# Patient Record
Sex: Female | Born: 1935
Health system: Southern US, Community
[De-identification: ages and names within clinical notes are randomized; demographics above are authoritative.]

## PROBLEM LIST (undated history)

## (undated) DIAGNOSIS — E538 Deficiency of other specified B group vitamins: Secondary | ICD-10-CM

## (undated) DIAGNOSIS — R413 Other amnesia: Secondary | ICD-10-CM

## (undated) DIAGNOSIS — I10 Essential (primary) hypertension: Secondary | ICD-10-CM

## (undated) DIAGNOSIS — E7800 Pure hypercholesterolemia, unspecified: Secondary | ICD-10-CM

## (undated) DIAGNOSIS — E119 Type 2 diabetes mellitus without complications: Secondary | ICD-10-CM

## (undated) DIAGNOSIS — I480 Paroxysmal atrial fibrillation: Secondary | ICD-10-CM

## (undated) HISTORY — DX: Other amnesia: R41.3

## (undated) HISTORY — PX: ABDOMINAL HYSTERECTOMY: SHX81

## (undated) HISTORY — DX: Pure hypercholesterolemia, unspecified: E78.00

## (undated) HISTORY — DX: Paroxysmal atrial fibrillation: I48.0

## (undated) HISTORY — DX: Essential (primary) hypertension: I10

## (undated) HISTORY — DX: Type 2 diabetes mellitus without complications: E11.9

## (undated) HISTORY — DX: Deficiency of other specified B group vitamins: E53.8

---

## 1997-04-16 ENCOUNTER — Other Ambulatory Visit: Admission: RE | Admit: 1997-04-16 | Discharge: 1997-04-16 | Payer: Self-pay | Admitting: Family Medicine

## 1997-11-03 ENCOUNTER — Emergency Department (HOSPITAL_COMMUNITY): Admission: EM | Admit: 1997-11-03 | Discharge: 1997-11-03 | Payer: Self-pay | Admitting: Emergency Medicine

## 1998-04-10 ENCOUNTER — Emergency Department (HOSPITAL_COMMUNITY): Admission: EM | Admit: 1998-04-10 | Discharge: 1998-04-10 | Payer: Self-pay | Admitting: Emergency Medicine

## 1998-04-30 ENCOUNTER — Encounter: Payer: Self-pay | Admitting: Family Medicine

## 1998-04-30 ENCOUNTER — Ambulatory Visit (HOSPITAL_COMMUNITY): Admission: RE | Admit: 1998-04-30 | Discharge: 1998-04-30 | Payer: Self-pay | Admitting: Family Medicine

## 1999-03-25 ENCOUNTER — Encounter: Payer: Self-pay | Admitting: Family Medicine

## 1999-03-25 ENCOUNTER — Ambulatory Visit (HOSPITAL_COMMUNITY): Admission: RE | Admit: 1999-03-25 | Discharge: 1999-03-25 | Payer: Self-pay | Admitting: Family Medicine

## 2000-12-21 ENCOUNTER — Other Ambulatory Visit: Admission: RE | Admit: 2000-12-21 | Discharge: 2000-12-21 | Payer: Self-pay | Admitting: Family Medicine

## 2003-03-20 ENCOUNTER — Encounter: Admission: RE | Admit: 2003-03-20 | Discharge: 2003-03-20 | Payer: Self-pay | Admitting: Family Medicine

## 2003-04-28 ENCOUNTER — Emergency Department (HOSPITAL_COMMUNITY): Admission: EM | Admit: 2003-04-28 | Discharge: 2003-04-29 | Payer: Self-pay | Admitting: Emergency Medicine

## 2003-05-01 ENCOUNTER — Emergency Department (HOSPITAL_COMMUNITY): Admission: EM | Admit: 2003-05-01 | Discharge: 2003-05-01 | Payer: Self-pay | Admitting: Family Medicine

## 2003-05-03 ENCOUNTER — Emergency Department (HOSPITAL_COMMUNITY): Admission: EM | Admit: 2003-05-03 | Discharge: 2003-05-03 | Payer: Self-pay | Admitting: Family Medicine

## 2003-06-21 ENCOUNTER — Emergency Department (HOSPITAL_COMMUNITY): Admission: EM | Admit: 2003-06-21 | Discharge: 2003-06-21 | Payer: Self-pay | Admitting: Family Medicine

## 2003-06-28 ENCOUNTER — Emergency Department (HOSPITAL_COMMUNITY): Admission: EM | Admit: 2003-06-28 | Discharge: 2003-06-28 | Payer: Self-pay | Admitting: Family Medicine

## 2003-09-26 ENCOUNTER — Encounter (INDEPENDENT_AMBULATORY_CARE_PROVIDER_SITE_OTHER): Payer: Self-pay | Admitting: Specialist

## 2003-09-26 ENCOUNTER — Ambulatory Visit (HOSPITAL_COMMUNITY): Admission: RE | Admit: 2003-09-26 | Discharge: 2003-09-26 | Payer: Self-pay | Admitting: Gastroenterology

## 2004-07-13 ENCOUNTER — Encounter: Admission: RE | Admit: 2004-07-13 | Discharge: 2004-07-13 | Payer: Self-pay | Admitting: Family Medicine

## 2006-04-02 ENCOUNTER — Emergency Department (HOSPITAL_COMMUNITY): Admission: EM | Admit: 2006-04-02 | Discharge: 2006-04-03 | Payer: Self-pay | Admitting: Emergency Medicine

## 2006-04-05 ENCOUNTER — Encounter: Admission: RE | Admit: 2006-04-05 | Discharge: 2006-04-05 | Payer: Self-pay | Admitting: Family Medicine

## 2006-10-17 ENCOUNTER — Ambulatory Visit: Payer: Self-pay | Admitting: Surgery

## 2006-10-17 ENCOUNTER — Ambulatory Visit (HOSPITAL_COMMUNITY): Admission: RE | Admit: 2006-10-17 | Discharge: 2006-10-17 | Payer: Self-pay | Admitting: Family Medicine

## 2007-04-27 ENCOUNTER — Encounter: Admission: RE | Admit: 2007-04-27 | Discharge: 2007-04-27 | Payer: Self-pay | Admitting: Family Medicine

## 2008-11-22 ENCOUNTER — Ambulatory Visit (HOSPITAL_COMMUNITY): Admission: RE | Admit: 2008-11-22 | Discharge: 2008-11-22 | Payer: Self-pay | Admitting: Family Medicine

## 2009-06-01 ENCOUNTER — Observation Stay (HOSPITAL_COMMUNITY): Admission: EM | Admit: 2009-06-01 | Discharge: 2009-06-03 | Payer: Self-pay | Admitting: Emergency Medicine

## 2010-02-08 ENCOUNTER — Encounter: Payer: Self-pay | Admitting: Family Medicine

## 2010-04-06 LAB — GLUCOSE, CAPILLARY
Glucose-Capillary: 131 mg/dL — ABNORMAL HIGH (ref 70–99)
Glucose-Capillary: 145 mg/dL — ABNORMAL HIGH (ref 70–99)
Glucose-Capillary: 154 mg/dL — ABNORMAL HIGH (ref 70–99)
Glucose-Capillary: 160 mg/dL — ABNORMAL HIGH (ref 70–99)
Glucose-Capillary: 163 mg/dL — ABNORMAL HIGH (ref 70–99)
Glucose-Capillary: 165 mg/dL — ABNORMAL HIGH (ref 70–99)
Glucose-Capillary: 170 mg/dL — ABNORMAL HIGH (ref 70–99)
Glucose-Capillary: 171 mg/dL — ABNORMAL HIGH (ref 70–99)
Glucose-Capillary: 181 mg/dL — ABNORMAL HIGH (ref 70–99)
Glucose-Capillary: 186 mg/dL — ABNORMAL HIGH (ref 70–99)
Glucose-Capillary: 191 mg/dL — ABNORMAL HIGH (ref 70–99)
Glucose-Capillary: 191 mg/dL — ABNORMAL HIGH (ref 70–99)
Glucose-Capillary: 211 mg/dL — ABNORMAL HIGH (ref 70–99)
Glucose-Capillary: 256 mg/dL — ABNORMAL HIGH (ref 70–99)
Glucose-Capillary: 265 mg/dL — ABNORMAL HIGH (ref 70–99)
Glucose-Capillary: 68 mg/dL — ABNORMAL LOW (ref 70–99)
Glucose-Capillary: 75 mg/dL (ref 70–99)
Glucose-Capillary: 84 mg/dL (ref 70–99)

## 2010-04-06 LAB — POCT I-STAT, CHEM 8
BUN: 30 mg/dL — ABNORMAL HIGH (ref 6–23)
BUN: 38 mg/dL — ABNORMAL HIGH (ref 6–23)
Calcium, Ion: 1.19 mmol/L (ref 1.12–1.32)
Calcium, Ion: 1.24 mmol/L (ref 1.12–1.32)
Chloride: 105 mEq/L (ref 96–112)
Chloride: 105 mEq/L (ref 96–112)
Creatinine, Ser: 1.1 mg/dL (ref 0.4–1.2)
Creatinine, Ser: 1.3 mg/dL — ABNORMAL HIGH (ref 0.4–1.2)
Glucose, Bld: 40 mg/dL — CL (ref 70–99)
Glucose, Bld: 68 mg/dL — ABNORMAL LOW (ref 70–99)
HCT: 35 % — ABNORMAL LOW (ref 36.0–46.0)
HCT: 38 % (ref 36.0–46.0)
Hemoglobin: 11.9 g/dL — ABNORMAL LOW (ref 12.0–15.0)
Hemoglobin: 12.9 g/dL (ref 12.0–15.0)
Potassium: 3.9 mEq/L (ref 3.5–5.1)
Potassium: 4.4 mEq/L (ref 3.5–5.1)
Sodium: 140 mEq/L (ref 135–145)
Sodium: 143 mEq/L (ref 135–145)
TCO2: 31 mmol/L (ref 0–100)
TCO2: 31 mmol/L (ref 0–100)

## 2010-04-06 LAB — BASIC METABOLIC PANEL
BUN: 19 mg/dL (ref 6–23)
BUN: 21 mg/dL (ref 6–23)
CO2: 30 mEq/L (ref 19–32)
CO2: 32 mEq/L (ref 19–32)
Calcium: 8.5 mg/dL (ref 8.4–10.5)
Calcium: 8.9 mg/dL (ref 8.4–10.5)
Chloride: 107 mEq/L (ref 96–112)
Chloride: 108 mEq/L (ref 96–112)
Creatinine, Ser: 1.12 mg/dL (ref 0.4–1.2)
Creatinine, Ser: 1.16 mg/dL (ref 0.4–1.2)
GFR calc Af Amer: 55 mL/min — ABNORMAL LOW (ref >60)
GFR calc Af Amer: 57 mL/min — ABNORMAL LOW (ref >60)
GFR calc non Af Amer: 45 mL/min — ABNORMAL LOW (ref >60)
GFR calc non Af Amer: 47 mL/min — ABNORMAL LOW (ref >60)
Glucose, Bld: 125 mg/dL — ABNORMAL HIGH (ref 70–99)
Glucose, Bld: 164 mg/dL — ABNORMAL HIGH (ref 70–99)
Potassium: 3.6 mEq/L (ref 3.5–5.1)
Potassium: 4.4 mEq/L (ref 3.5–5.1)
Sodium: 141 mEq/L (ref 135–145)
Sodium: 141 mEq/L (ref 135–145)

## 2010-04-06 LAB — MAGNESIUM
Magnesium: 1.7 mg/dL (ref 1.5–2.5)
Magnesium: 1.8 mg/dL (ref 1.5–2.5)

## 2010-04-06 LAB — DIFFERENTIAL
Basophils Absolute: 0 10*3/uL (ref 0.0–0.1)
Basophils Absolute: 0 10*3/uL (ref 0.0–0.1)
Basophils Relative: 0 % (ref 0–1)
Basophils Relative: 1 % (ref 0–1)
Eosinophils Absolute: 0 10*3/uL (ref 0.0–0.7)
Eosinophils Absolute: 0.1 10*3/uL (ref 0.0–0.7)
Eosinophils Relative: 0 % (ref 0–5)
Eosinophils Relative: 1 % (ref 0–5)
Lymphocytes Relative: 17 % (ref 12–46)
Lymphocytes Relative: 37 % (ref 12–46)
Lymphs Abs: 1.3 10*3/uL (ref 0.7–4.0)
Lymphs Abs: 2.4 10*3/uL (ref 0.7–4.0)
Monocytes Absolute: 0.5 10*3/uL (ref 0.1–1.0)
Monocytes Absolute: 0.6 10*3/uL (ref 0.1–1.0)
Monocytes Relative: 6 % (ref 3–12)
Monocytes Relative: 8 % (ref 3–12)
Neutro Abs: 3.5 10*3/uL (ref 1.7–7.7)
Neutro Abs: 6 10*3/uL (ref 1.7–7.7)
Neutrophils Relative %: 53 % (ref 43–77)
Neutrophils Relative %: 76 % (ref 43–77)

## 2010-04-06 LAB — PHOSPHORUS: Phosphorus: 3.5 mg/dL (ref 2.3–4.6)

## 2010-04-06 LAB — URINALYSIS, ROUTINE W REFLEX MICROSCOPIC
Bilirubin Urine: NEGATIVE
Glucose, UA: NEGATIVE mg/dL
Hgb urine dipstick: NEGATIVE
Ketones, ur: NEGATIVE mg/dL
Leukocytes, UA: NEGATIVE
Nitrite: NEGATIVE
Protein, ur: 30 mg/dL — AB
Specific Gravity, Urine: 1.021 (ref 1.005–1.030)
Urobilinogen, UA: 1 mg/dL (ref 0.0–1.0)
pH: 5.5 (ref 5.0–8.0)

## 2010-04-06 LAB — CBC
HCT: 31.4 % — ABNORMAL LOW (ref 36.0–46.0)
HCT: 35.7 % — ABNORMAL LOW (ref 36.0–46.0)
Hemoglobin: 10.4 g/dL — ABNORMAL LOW (ref 12.0–15.0)
Hemoglobin: 11.4 g/dL — ABNORMAL LOW (ref 12.0–15.0)
MCHC: 31.9 g/dL (ref 30.0–36.0)
MCHC: 32.3 g/dL (ref 30.0–36.0)
MCV: 82.4 fL (ref 78.0–100.0)
MCV: 82.8 fL (ref 78.0–100.0)
Platelets: 173 10*3/uL (ref 150–400)
Platelets: 198 10*3/uL (ref 150–400)
RBC: 3.81 MIL/uL — ABNORMAL LOW (ref 3.87–5.11)
RBC: 4.32 MIL/uL (ref 3.87–5.11)
RDW: 14.3 % (ref 11.5–15.5)
RDW: 14.6 % (ref 11.5–15.5)
WBC: 6.6 10*3/uL (ref 4.0–10.5)
WBC: 7.9 10*3/uL (ref 4.0–10.5)

## 2010-04-06 LAB — URINE MICROSCOPIC-ADD ON

## 2010-04-06 LAB — LIPID PANEL
Cholesterol: 169 mg/dL (ref 0–200)
HDL: 55 mg/dL (ref >39)
LDL Cholesterol: 96 mg/dL (ref 0–99)
Total CHOL/HDL Ratio: 3.1 RATIO
Triglycerides: 89 mg/dL (ref <150)
VLDL: 18 mg/dL (ref 0–40)

## 2010-04-06 LAB — HEMOGLOBIN A1C
Hgb A1c MFr Bld: 6.2 % — ABNORMAL HIGH (ref <5.7)
Mean Plasma Glucose: 131 mg/dL — ABNORMAL HIGH (ref <117)

## 2010-04-06 LAB — TSH: TSH: 1.522 u[IU]/mL (ref 0.350–4.500)

## 2010-06-05 NOTE — Op Note (Signed)
NAMEDEBHORA, Jamie Mueller                        ACCOUNT NO.:  [id number]   MEDICAL RECORD NO.:  [id number]                   PATIENT TYPE:  AMB   LOCATION:  ENDO                                 FACILITY:  [facility]   PHYSICIAN:  Jordan Hawks. Elnoria Howard, MD                 DATE OF BIRTH:  [birth year]   DATE OF PROCEDURE:  09/26/2003  DATE OF DISCHARGE:                                 OPERATIVE REPORT   INDICATION:  Screening colonoscopy.   ENDOSCOPIST:  Jordan Hawks. Elnoria Howard, MD.   EQUIPMENT USED:  An Olympus adult colonoscope.   PHYSICAL ASSESSMENT:  CARDIAC:  Regular rate and rhythm.  LUNGS:  Clear to auscultation bilaterally.  ABDOMEN:  Flat, soft, nontender, nondistended.  Positive bowel sounds.   MEDICATIONS:  Versed 5 mg IV, Demerol 70 mg IV.   CONSENT:  Informed consent was obtained from the patient describing the  risks of bleeding, infection, perforation, risk of medication reaction, a  10% missed rate for small colon cancer and the risk of death, all of which  are not exclusive of any other complications that can occur.   PROCEDURE:  The patient was placed in the left lateral decubitus position  and after adequate sedation was achieved, the colonoscope was introduced  through the anus under direct visualization and advanced to the terminal  ileum without difficulty.  On withdrawal of the scope, there was only  evidence of a 3 mm pedunculated polyp that was hot-snared and good  hemostasis was achieved.  No complications were encountered with that snare  polyp which was retrieved.  There is no other evidence of polyps, masses,  inflammation, erosions or ulcerations.  The patient was noted to have  external hemorrhoids on retroflexion, and she was rated to have an excellent  prep.  The patient tolerated the procedure well.  No complications were  encountered.  Plan is to repeat the colonoscopy in five years, and she is to  return to the office in two weeks for followup.                                Jordan Hawks Elnoria Howard, MD    PDH/MEDQ  D:  09/26/2003  T:  09/26/2003  Job:  [id number]

## 2010-08-07 ENCOUNTER — Emergency Department (HOSPITAL_COMMUNITY)
Admission: EM | Admit: 2010-08-07 | Discharge: 2010-08-07 | Disposition: A | Discharge status: Home / Self Care | Payer: Medicare Other | Attending: Emergency Medicine | Admitting: Emergency Medicine

## 2010-08-07 DIAGNOSIS — R4182 Altered mental status, unspecified: Secondary | ICD-10-CM | POA: Insufficient documentation

## 2010-08-07 DIAGNOSIS — R4789 Other speech disturbances: Secondary | ICD-10-CM | POA: Insufficient documentation

## 2010-08-07 DIAGNOSIS — E1169 Type 2 diabetes mellitus with other specified complication: Secondary | ICD-10-CM | POA: Insufficient documentation

## 2010-08-07 DIAGNOSIS — I10 Essential (primary) hypertension: Secondary | ICD-10-CM | POA: Insufficient documentation

## 2010-08-07 DIAGNOSIS — Z794 Long term (current) use of insulin: Secondary | ICD-10-CM | POA: Insufficient documentation

## 2010-08-07 LAB — POCT I-STAT, CHEM 8
BUN: 20 mg/dL (ref 6–23)
Calcium, Ion: 1.16 mmol/L (ref 1.12–1.32)
Chloride: 103 mEq/L (ref 96–112)
Creatinine, Ser: 1.3 mg/dL — ABNORMAL HIGH (ref 0.50–1.10)
Glucose, Bld: 197 mg/dL — ABNORMAL HIGH (ref 70–99)
HCT: 41 % (ref 36.0–46.0)
Hemoglobin: 13.9 g/dL (ref 12.0–15.0)
Potassium: 4.1 mEq/L (ref 3.5–5.1)
Sodium: 141 mEq/L (ref 135–145)
TCO2: 28 mmol/L (ref 0–100)

## 2010-08-07 LAB — URINALYSIS, ROUTINE W REFLEX MICROSCOPIC
Bilirubin Urine: NEGATIVE
Glucose, UA: 250 mg/dL — AB
Hgb urine dipstick: NEGATIVE
Ketones, ur: NEGATIVE mg/dL
Leukocytes, UA: NEGATIVE
Nitrite: NEGATIVE
Protein, ur: 100 mg/dL — AB
Specific Gravity, Urine: 1.024 (ref 1.005–1.030)
Urobilinogen, UA: 1 mg/dL (ref 0.0–1.0)
pH: 5.5 (ref 5.0–8.0)

## 2010-08-07 LAB — GLUCOSE, CAPILLARY
Glucose-Capillary: 192 mg/dL — ABNORMAL HIGH (ref 70–99)
Glucose-Capillary: 39 mg/dL — CL (ref 70–99)
Glucose-Capillary: 191 mg/dL — ABNORMAL HIGH (ref 70–99)

## 2010-08-07 LAB — URINE MICROSCOPIC-ADD ON

## 2010-10-29 ENCOUNTER — Emergency Department (HOSPITAL_COMMUNITY)
Admission: EM | Admit: 2010-10-29 | Discharge: 2010-10-29 | Disposition: A | Discharge status: Home / Self Care | Payer: Medicare Other | Attending: Emergency Medicine | Admitting: Emergency Medicine

## 2010-10-29 DIAGNOSIS — Z794 Long term (current) use of insulin: Secondary | ICD-10-CM | POA: Insufficient documentation

## 2010-10-29 DIAGNOSIS — I10 Essential (primary) hypertension: Secondary | ICD-10-CM | POA: Insufficient documentation

## 2010-10-29 DIAGNOSIS — E876 Hypokalemia: Secondary | ICD-10-CM | POA: Insufficient documentation

## 2010-10-29 DIAGNOSIS — E1169 Type 2 diabetes mellitus with other specified complication: Secondary | ICD-10-CM | POA: Insufficient documentation

## 2010-10-29 LAB — DIFFERENTIAL
Basophils Absolute: 0 10*3/uL (ref 0.0–0.1)
Basophils Relative: 0 % (ref 0–1)
Eosinophils Absolute: 0 10*3/uL (ref 0.0–0.7)
Eosinophils Relative: 0 % (ref 0–5)
Lymphocytes Relative: 19 % (ref 12–46)
Lymphs Abs: 1.3 10*3/uL (ref 0.7–4.0)
Monocytes Absolute: 0.3 10*3/uL (ref 0.1–1.0)
Monocytes Relative: 4 % (ref 3–12)
Neutro Abs: 5.4 10*3/uL (ref 1.7–7.7)
Neutrophils Relative %: 78 % — ABNORMAL HIGH (ref 43–77)

## 2010-10-29 LAB — URINE MICROSCOPIC-ADD ON

## 2010-10-29 LAB — COMPREHENSIVE METABOLIC PANEL
ALT: 7 U/L (ref 0–35)
AST: 21 U/L (ref 0–37)
Albumin: 3.3 g/dL — ABNORMAL LOW (ref 3.5–5.2)
Alkaline Phosphatase: 72 U/L (ref 39–117)
BUN: 15 mg/dL (ref 6–23)
CO2: 23 mEq/L (ref 19–32)
Calcium: 9.3 mg/dL (ref 8.4–10.5)
Chloride: 101 mEq/L (ref 96–112)
Creatinine, Ser: 0.78 mg/dL (ref 0.50–1.10)
GFR calc Af Amer: >90 mL/min (ref >90)
GFR calc non Af Amer: 79 mL/min — ABNORMAL LOW (ref >90)
Glucose, Bld: 91 mg/dL (ref 70–99)
Potassium: 3.1 mEq/L — ABNORMAL LOW (ref 3.5–5.1)
Sodium: 136 mEq/L (ref 135–145)
Total Bilirubin: 0.5 mg/dL (ref 0.3–1.2)
Total Protein: 7.5 g/dL (ref 6.0–8.3)

## 2010-10-29 LAB — CBC
HCT: 37.4 % (ref 36.0–46.0)
Hemoglobin: 12 g/dL (ref 12.0–15.0)
MCH: 25.8 pg — ABNORMAL LOW (ref 26.0–34.0)
MCHC: 32.1 g/dL (ref 30.0–36.0)
MCV: 80.4 fL (ref 78.0–100.0)
Platelets: 230 10*3/uL (ref 150–400)
RBC: 4.65 MIL/uL (ref 3.87–5.11)
RDW: 13.3 % (ref 11.5–15.5)
WBC: 7 10*3/uL (ref 4.0–10.5)

## 2010-10-29 LAB — URINALYSIS, ROUTINE W REFLEX MICROSCOPIC
Bilirubin Urine: NEGATIVE
Glucose, UA: NEGATIVE mg/dL
Hgb urine dipstick: NEGATIVE
Ketones, ur: NEGATIVE mg/dL
Leukocytes, UA: NEGATIVE
Nitrite: NEGATIVE
Protein, ur: 30 mg/dL — AB
Specific Gravity, Urine: 1.007 (ref 1.005–1.030)
Urobilinogen, UA: 0.2 mg/dL (ref 0.0–1.0)
pH: 7 (ref 5.0–8.0)

## 2010-10-29 LAB — POCT I-STAT TROPONIN I: Troponin i, poc: 0.04 ng/mL (ref 0.00–0.08)

## 2010-11-02 LAB — GLUCOSE, CAPILLARY
Glucose-Capillary: 165 mg/dL — ABNORMAL HIGH (ref 70–99)
Glucose-Capillary: 152 mg/dL — ABNORMAL HIGH (ref 70–99)

## 2011-07-07 ENCOUNTER — Other Ambulatory Visit: Payer: Self-pay | Admitting: Internal Medicine

## 2011-07-07 DIAGNOSIS — Z1231 Encounter for screening mammogram for malignant neoplasm of breast: Secondary | ICD-10-CM

## 2011-07-20 ENCOUNTER — Ambulatory Visit
Admission: RE | Admit: 2011-07-20 | Discharge: 2011-07-20 | Disposition: A | Discharge status: Home / Self Care | Payer: Medicare Other | Source: Ambulatory Visit | Attending: Internal Medicine | Admitting: Internal Medicine

## 2011-07-20 DIAGNOSIS — Z1231 Encounter for screening mammogram for malignant neoplasm of breast: Secondary | ICD-10-CM

## 2012-06-20 ENCOUNTER — Other Ambulatory Visit (HOSPITAL_COMMUNITY): Payer: Self-pay | Admitting: Internal Medicine

## 2012-06-20 DIAGNOSIS — Z1231 Encounter for screening mammogram for malignant neoplasm of breast: Secondary | ICD-10-CM

## 2012-07-25 ENCOUNTER — Ambulatory Visit (HOSPITAL_COMMUNITY)
Admission: RE | Admit: 2012-07-25 | Discharge: 2012-07-25 | Disposition: A | Discharge status: Home / Self Care | Payer: Medicare Other | Source: Ambulatory Visit | Attending: Internal Medicine | Admitting: Internal Medicine

## 2012-07-25 DIAGNOSIS — Z1231 Encounter for screening mammogram for malignant neoplasm of breast: Secondary | ICD-10-CM | POA: Insufficient documentation

## 2013-09-28 ENCOUNTER — Other Ambulatory Visit (HOSPITAL_COMMUNITY): Payer: Self-pay | Admitting: Internal Medicine

## 2013-09-28 DIAGNOSIS — Z1231 Encounter for screening mammogram for malignant neoplasm of breast: Secondary | ICD-10-CM

## 2013-10-03 ENCOUNTER — Ambulatory Visit (HOSPITAL_COMMUNITY)
Admission: RE | Admit: 2013-10-03 | Discharge: 2013-10-03 | Disposition: A | Discharge status: Home / Self Care | Payer: Medicare Other | Source: Ambulatory Visit | Attending: Internal Medicine | Admitting: Internal Medicine

## 2013-10-03 DIAGNOSIS — Z1231 Encounter for screening mammogram for malignant neoplasm of breast: Secondary | ICD-10-CM

## 2014-02-13 ENCOUNTER — Ambulatory Visit: Payer: Self-pay | Admitting: Podiatry

## 2014-03-21 ENCOUNTER — Other Ambulatory Visit (HOSPITAL_COMMUNITY): Payer: Self-pay | Admitting: Urology

## 2014-03-21 ENCOUNTER — Ambulatory Visit (HOSPITAL_COMMUNITY)
Admission: RE | Admit: 2014-03-21 | Discharge: 2014-03-21 | Disposition: A | Discharge status: Home / Self Care | Payer: Medicare Other | Source: Ambulatory Visit | Attending: Urology | Admitting: Urology

## 2014-03-21 ENCOUNTER — Other Ambulatory Visit (HOSPITAL_COMMUNITY): Payer: Self-pay | Admitting: Internal Medicine

## 2014-03-21 DIAGNOSIS — M7989 Other specified soft tissue disorders: Secondary | ICD-10-CM

## 2014-03-21 NOTE — Progress Notes (Signed)
*  Preliminary Results* Left lower extremity venous duplex completed. Left lower extremity is negative for deep vein thrombosis. There is no evidence of left Baker's cyst.  03/21/2014 6:45 PM  Gertie FeyMichelle Adaline Trejos, RVT, RDCS, RDMS

## 2014-11-04 ENCOUNTER — Other Ambulatory Visit: Payer: Self-pay

## 2014-11-04 ENCOUNTER — Other Ambulatory Visit: Payer: Self-pay | Admitting: Internal Medicine

## 2014-11-04 DIAGNOSIS — Z1231 Encounter for screening mammogram for malignant neoplasm of breast: Secondary | ICD-10-CM

## 2014-12-11 ENCOUNTER — Ambulatory Visit
Admission: RE | Admit: 2014-12-11 | Discharge: 2014-12-11 | Disposition: A | Discharge status: Home / Self Care | Payer: Medicare Other | Source: Ambulatory Visit | Attending: Internal Medicine | Admitting: Internal Medicine

## 2014-12-11 DIAGNOSIS — Z1231 Encounter for screening mammogram for malignant neoplasm of breast: Secondary | ICD-10-CM

## 2014-12-26 ENCOUNTER — Ambulatory Visit (INDEPENDENT_AMBULATORY_CARE_PROVIDER_SITE_OTHER): Payer: Medicare Other | Admitting: Podiatry

## 2014-12-26 ENCOUNTER — Encounter: Payer: Self-pay | Admitting: Podiatry

## 2014-12-26 VITALS — BP 133/71 | HR 83 | Resp 14

## 2014-12-26 DIAGNOSIS — B351 Tinea unguium: Secondary | ICD-10-CM | POA: Diagnosis not present

## 2014-12-26 DIAGNOSIS — M79676 Pain in unspecified toe(s): Secondary | ICD-10-CM

## 2014-12-26 NOTE — Progress Notes (Signed)
   Subjective:    Patient ID: Jamie Mueller, female    DOB: January 30, 1930, 81 y.o.   MRN: [id number]  HPI this patient presents to my office with chief complaint of painful toenail on her left big toe. She states that she has pain that develops by the end of the day through the toe. She denies any injury or trauma or even drainage from the toe itself. She says the pain worsens by the end of the day and she was referred to this office by her medical doctor. This patient is diabetic and presents to the office for an evaluation and treatment of this nail The patient is here today with left great toe pain.   Review of Systems  All other systems reviewed and are negative.      Objective:   Physical Exam GENERAL APPEARANCE: Alert, conversant. Appropriately groomed. No acute distress.  VASCULAR: [facility]  [facility].  Capillary refill time is immediate to all digits,  Normal temperature gradient.  Digital hair growth is present bilateral  NEUROLOGIC: sensation is diminished l to 5.07 monofilament at 5/5 sites bilateral.  Light touch is intact bilateral, Muscle strength normal.  MUSCULOSKELETAL: acceptable muscle strength, tone and stability bilateral.  Intrinsic muscluature intact bilateral.  Rectus appearance of foot and digits noted bilateral.   DERMATOLOGIC: skin color, texture, and turgor are within normal limits.  No preulcerative lesions or ulcers  are seen, no interdigital maceration noted.  No open lesions present.  Left hallux toenail is thick and disfigured and discolored with no redness or swelling or drainage.  The distal aspect left hallux toenail is detached from the nail bed.. No drainage noted.        Assessment & Plan:  Onychomycosis  Left Hallux  IE  Debride left hallux nail.  Told her to perform home soaks.  RTC pain persists.  Helane GuntherGregory Mayer DPM

## 2015-03-19 HISTORY — PX: CATARACT EXTRACTION: SUR2

## 2015-07-09 ENCOUNTER — Other Ambulatory Visit: Payer: Self-pay | Admitting: Internal Medicine

## 2015-07-09 DIAGNOSIS — I739 Peripheral vascular disease, unspecified: Secondary | ICD-10-CM

## 2015-07-14 ENCOUNTER — Ambulatory Visit
Admission: RE | Admit: 2015-07-14 | Discharge: 2015-07-14 | Disposition: A | Discharge status: Home / Self Care | Payer: Medicare Other | Source: Ambulatory Visit | Attending: Internal Medicine | Admitting: Internal Medicine

## 2015-07-14 DIAGNOSIS — I739 Peripheral vascular disease, unspecified: Secondary | ICD-10-CM

## 2015-09-25 ENCOUNTER — Other Ambulatory Visit: Payer: Self-pay | Admitting: Surgery

## 2015-09-25 DIAGNOSIS — I739 Peripheral vascular disease, unspecified: Secondary | ICD-10-CM

## 2015-10-15 ENCOUNTER — Encounter: Payer: Self-pay | Admitting: Surgery

## 2015-10-22 ENCOUNTER — Encounter: Payer: Self-pay | Admitting: Surgery

## 2015-10-22 ENCOUNTER — Ambulatory Visit (INDEPENDENT_AMBULATORY_CARE_PROVIDER_SITE_OTHER): Payer: Medicare Other | Admitting: Surgery

## 2015-10-22 ENCOUNTER — Ambulatory Visit (HOSPITAL_COMMUNITY)
Admission: RE | Admit: 2015-10-22 | Discharge: 2015-10-22 | Disposition: A | Discharge status: Home / Self Care | Payer: Medicare Other | Source: Ambulatory Visit | Attending: Vascular Surgery | Admitting: Vascular Surgery

## 2015-10-22 VITALS — BP 128/65 | HR 73 | Temp 98.8°F | Resp 16 | Ht 63.0 in | Wt 125.0 lb

## 2015-10-22 DIAGNOSIS — I771 Stricture of artery: Secondary | ICD-10-CM | POA: Insufficient documentation

## 2015-10-22 DIAGNOSIS — I70212 Atherosclerosis of native arteries of extremities with intermittent claudication, left leg: Secondary | ICD-10-CM | POA: Diagnosis not present

## 2015-10-22 DIAGNOSIS — I739 Peripheral vascular disease, unspecified: Secondary | ICD-10-CM | POA: Diagnosis not present

## 2015-10-22 LAB — VAS US LOWER EXTREMITY ARTERIAL DUPLEX
Left popliteal dist sys PSV: -31 cm/s
Left popliteal prox sys PSV: 39 cm/s
Left super femoral dist sys PSV: -60 cm/s
left post tibial dist sys: 33 cm/s

## 2015-10-22 NOTE — Progress Notes (Signed)
[facility] [place]  Patient name: Jamie Mueller MRN: [id number] DOB: 08/23/1930 Sex: female  REFERRING PHYSICIAN: Dr. Nehemiah SettlePolite  REASON FOR CONSULT: Claudication  HPI: Jamie Mueller is a 82 y.o. female, who is referred today for evaluation of left leg pain.  The patient states that she has not told anybody about her pain for a while and that it has been going on for a while.  She describes this as pain from her knee to her toes on the left.  It can occur when she wiggles her toes.  She also states that when she walks the bottom of her foot hurts.  She states that she can walk as far she wants and is not limited by her legs giving out or leg cramping.  There are no aggravating or relieving factors.  She denies nonhealing wounds or chronic infections in her foot.  The patient suffers and diabetes which according to her daughter has been under good control.  She is on statin for hypercholesterolemia.  She takes an ACE inhibitor for hypertension.  She is a nonsmoker.  Past Medical History:  Diagnosis Date  . Diabetes mellitus without complication (HCC)     No family history on file.  SOCIAL HISTORY: Social History   Social History  . Marital status: Married    Spouse name: N/A  . Number of children: N/A  . Years of education: N/A   Occupational History  . Not on file.   Social History Main Topics  . Smoking status: Never Smoker  . Smokeless tobacco: Not on file  . Alcohol use No  . Drug use: No  . Sexual activity: Not on file   Other Topics Concern  . Not on file   Social History Narrative  . No narrative on file    No Known Allergies  Current Outpatient Prescriptions  Medication Sig Dispense Refill  . atenolol (TENORMIN) 50 MG tablet TK 1 T PO QD  4  . chlorthalidone (HYGROTON) 25 MG tablet TK 1 T PO QAM  3  . enalapril (VASOTEC) 10 MG tablet TK 1 T PO QD  7  . gabapentin (NEURONTIN) 300 MG capsule  Take 300 mg by mouth 3 (three) times daily.    . metFORMIN (GLUCOPHAGE) 500 MG tablet TK 2 TS PO  BID  3  . pravastatin (PRAVACHOL) 40 MG tablet TK 1 T PO QD  4   No current facility-administered medications for this visit.     REVIEW OF SYSTEMS:  [X]  denotes positive finding, [ ]  denotes negative finding Cardiac  Comments:  Chest pain or chest pressure:    Shortness of breath upon exertion:    Short of breath when lying flat:    Irregular heart rhythm:        Vascular    Pain in calf, thigh, or hip brought on by ambulation: 2   Pain in feet at night that wakes you up from your sleep:     Blood clot in your veins:    Leg swelling:         Pulmonary    Oxygen at home:    Productive cough:     Wheezing:         Neurologic    Sudden weakness in arms or legs:     Sudden numbness in arms or legs:     Sudden onset of difficulty speaking or slurred speech:    Temporary loss of vision in one eye:  Problems with dizziness:         Gastrointestinal    Blood in stool:     Vomited blood:         Genitourinary    Burning when urinating:     Blood in urine:        Psychiatric    Major depression:         Hematologic    Bleeding problems:    Problems with blood clotting too easily:        Skin    Rashes or ulcers:        Constitutional    Fever or chills:      PHYSICAL EXAM: There were no vitals filed for this visit.  GENERAL: The patient is a well-nourished female, in no acute distress. The vital signs are documented above. CARDIAC: There is a regular rate and rhythm.  VASCULAR: Nonpalpable pedal pulses PULMONARY: There is good air exchange bilaterally without wheezing or rales. ABDOMEN: Soft and non-tender with normal pitched bowel sounds.  MUSCULOSKELETAL: There are no major deformities or cyanosis. NEUROLOGIC: No focal weakness or paresthesias are detected. SKIN: There are no ulcers or rashes noted. PSYCHIATRIC: The patient has a normal affect.  DATA:  I  have reviewed the outside duplex with the following findings: Right Lower Extremity: Segmental pressures suggest both iliofemoral and infrapopliteal components of occlusive disease. Triphasic waveform at the femoral level, biphasic into the posterior tibial, monophasic in the dorsalis pedis. Toe brachial index 0.28.  Left Lower Extremity: Segmental pressures suggest primarily femoral-popliteal occlusive disease. Triphasic waveform at the femoral level, biphasic in the popliteal, monophasic distally. Toe brachial index 0.18.  Pulse volume recording is diminished below the knee on the left compared to the right .   ABIs performed at our office today was 0.6 for the right and 0.48 on the left  ASSESSMENT AND PLAN: Peripheral vascular disease: The patient roughly has lower extremity vascular disease, worse on the left.  However, I am not convinced that this is causing her symptoms.  She does not have worsening pain with increasing activity.  Her pain is constant.  That makes me feel that she likely has some form of musculoskeletal component to her symptoms such as plantar fasciitis.  Possibly, she could have worsening neuropathy.  I think she would benefit from increasing her gabapentin.  I discussed with her and her family that I would not recommend vascular intervention at this time as I'm not sure that it would be very beneficial.  However after ruling out other potential causes if she is still having unresolved pain, the next step would be angiography.  I have her scheduled to follow up with me again in 6 months with repeat ABIs.   Durene Cal, MD [facility] [phone] Pager [phone]

## 2015-11-19 ENCOUNTER — Encounter: Payer: Self-pay | Admitting: Podiatry

## 2015-11-26 ENCOUNTER — Ambulatory Visit: Payer: Medicare Other | Admitting: Podiatry

## 2015-12-03 ENCOUNTER — Ambulatory Visit (INDEPENDENT_AMBULATORY_CARE_PROVIDER_SITE_OTHER): Payer: Medicare Other | Admitting: Podiatry

## 2015-12-03 VITALS — Ht 63.0 in | Wt 125.0 lb

## 2015-12-03 DIAGNOSIS — B351 Tinea unguium: Secondary | ICD-10-CM

## 2015-12-03 DIAGNOSIS — M79676 Pain in unspecified toe(s): Secondary | ICD-10-CM | POA: Diagnosis not present

## 2015-12-03 NOTE — Progress Notes (Signed)
   Subjective:    Patient ID: Jamie Mueller, female    DOB: [birth year], 82 y.o.   MRN: [id number]  HPI this patient presents to my office with chief complaint of painful toenail on her left big toe. She states that she has pain that develops by the end of the day through the toe. She denies any injury or trauma or even drainage from the toe itself. She says the pain worsens by the end of the day and she was referred to this office by her medical doctor. This patient is diabetic and presents to the office for an evaluation and treatment of this nail    Review of Systems  All other systems reviewed and are negative.      Objective:   Physical Exam GENERAL APPEARANCE: Alert, conversant. Appropriately groomed. No acute distress.  VASCULAR: Pedal pulses palpable [facility]  [facility].  Capillary refill time is immediate to all digits,  Normal temperature gradient.   NEUROLOGIC: sensation is diminished l to 5.07 monofilament at 5/5 sites bilateral.  Light touch is intact bilateral, Muscle strength normal.  MUSCULOSKELETAL: acceptable muscle strength, tone and stability bilateral.  Intrinsic muscluature intact bilateral.  Rectus appearance of foot and digits noted bilateral.   DERMATOLOGIC: skin color, texture, and turgor are within normal limits.  No preulcerative lesions or ulcers  are seen, no interdigital maceration noted.  No open lesions present.  Left hallux toenail is thick and disfigured and discolored with no redness or swelling or drainage.  The distal aspect left hallux toenail is detached from the nail bed.. No drainage noted.        Assessment & Plan:  Onychomycosis  Left Hallux  IE  Debride left hallux nail.    RTC  3 months.  Helane GuntherGregory Tyre Beaver DPM

## 2015-12-05 NOTE — Addendum Note (Signed)
Addended by: Burton ApleyPETTY, Kaisa Wofford A on: 12/05/2015 03:21 PM   Modules accepted: Orders

## 2016-02-24 ENCOUNTER — Telehealth: Payer: Self-pay | Admitting: *Deleted

## 2016-02-24 NOTE — Telephone Encounter (Signed)
Pt states she has an appt with Dr. Stacie AcresMayer tomorrow 8:30am and doesn't feel well and would like to reschedule her appt.

## 2016-02-25 ENCOUNTER — Encounter: Payer: Self-pay | Admitting: Podiatry

## 2016-02-25 ENCOUNTER — Ambulatory Visit (INDEPENDENT_AMBULATORY_CARE_PROVIDER_SITE_OTHER): Payer: Medicare Other | Admitting: Podiatry

## 2016-02-25 VITALS — Ht 63.0 in | Wt 125.0 lb

## 2016-02-25 DIAGNOSIS — M79676 Pain in unspecified toe(s): Secondary | ICD-10-CM

## 2016-02-25 DIAGNOSIS — B351 Tinea unguium: Secondary | ICD-10-CM

## 2016-02-25 NOTE — Progress Notes (Signed)
   Subjective:    Patient ID: Jamie Mueller, female    DOB: 1930/12/14, 83 y.o.   MRN: [id number]  HPI this patient presents to my office with chief complaint of painful toenail on her left big toe. She states that she has pain that develops by the end of the day through the toe. She denies any injury or trauma or even drainage from the toe itself. She says the pain worsens by the end of the day  This patient is diabetic and presents to the office for an evaluation and treatment of this nail.  Patient is experiencing pain out of proportion to what is seen.    Review of Systems  All other systems reviewed and are negative.      Objective:   Physical Exam GENERAL APPEARANCE: Alert, conversant. Appropriately groomed. No acute distress.  VASCULAR: Pedal pulses palpable at  [facility] bilateral.  Capillary refill time is immediate to all digits,  Normal temperature gradient.   NEUROLOGIC: sensation is diminished l to 5.07 monofilament at 5/5 sites bilateral.  Light touch is intact bilateral, Muscle strength normal.  MUSCULOSKELETAL: acceptable muscle strength, tone and stability bilateral.  Intrinsic muscluature intact bilateral.  Rectus appearance of foot and digits noted bilateral.   DERMATOLOGIC: skin color, texture, and turgor are within normal limits.  No preulcerative lesions or ulcers  are seen, no interdigital maceration noted.  No open lesions present.  Left hallux toenail is thick and disfigured and discolored with no redness or swelling or drainage.   Pain noted along the medial border left hallux toenail.. No drainage noted.        Assessment & Plan:  Onychomycosis  Left Hallux  IE  Debride left hallux nail.    RTC  Prn. Discussed possible nail surgery for the permanent correction of this nail problem at a later date.   Helane GuntherGregory Kailea Dannemiller DPM

## 2016-04-14 ENCOUNTER — Encounter: Payer: Self-pay | Admitting: Surgery

## 2016-04-26 ENCOUNTER — Encounter (HOSPITAL_COMMUNITY): Payer: Medicare Other

## 2016-04-26 ENCOUNTER — Ambulatory Visit: Payer: Medicare Other

## 2017-03-30 ENCOUNTER — Observation Stay (HOSPITAL_COMMUNITY)
Admission: EM | Admit: 2017-03-30 | Discharge: 2017-03-31 | Disposition: A | Discharge status: Home / Self Care | Payer: Medicare Other | Attending: Internal Medicine | Admitting: Internal Medicine

## 2017-03-30 ENCOUNTER — Observation Stay (HOSPITAL_COMMUNITY): Payer: Medicare Other

## 2017-03-30 ENCOUNTER — Encounter (HOSPITAL_COMMUNITY): Payer: Self-pay | Admitting: Emergency Medicine

## 2017-03-30 ENCOUNTER — Other Ambulatory Visit: Payer: Self-pay

## 2017-03-30 ENCOUNTER — Emergency Department (HOSPITAL_COMMUNITY): Payer: Medicare Other

## 2017-03-30 DIAGNOSIS — M79672 Pain in left foot: Secondary | ICD-10-CM | POA: Diagnosis not present

## 2017-03-30 DIAGNOSIS — R42 Dizziness and giddiness: Secondary | ICD-10-CM | POA: Diagnosis not present

## 2017-03-30 DIAGNOSIS — Z7984 Long term (current) use of oral hypoglycemic drugs: Secondary | ICD-10-CM | POA: Diagnosis not present

## 2017-03-30 DIAGNOSIS — E1159 Type 2 diabetes mellitus with other circulatory complications: Secondary | ICD-10-CM

## 2017-03-30 DIAGNOSIS — R55 Syncope and collapse: Secondary | ICD-10-CM | POA: Insufficient documentation

## 2017-03-30 DIAGNOSIS — N179 Acute kidney failure, unspecified: Secondary | ICD-10-CM | POA: Diagnosis not present

## 2017-03-30 DIAGNOSIS — M79671 Pain in right foot: Secondary | ICD-10-CM | POA: Insufficient documentation

## 2017-03-30 DIAGNOSIS — E785 Hyperlipidemia, unspecified: Secondary | ICD-10-CM | POA: Insufficient documentation

## 2017-03-30 DIAGNOSIS — G959 Disease of spinal cord, unspecified: Secondary | ICD-10-CM | POA: Diagnosis not present

## 2017-03-30 DIAGNOSIS — I480 Paroxysmal atrial fibrillation: Secondary | ICD-10-CM | POA: Insufficient documentation

## 2017-03-30 DIAGNOSIS — I160 Hypertensive urgency: Secondary | ICD-10-CM | POA: Diagnosis not present

## 2017-03-30 DIAGNOSIS — I10 Essential (primary) hypertension: Secondary | ICD-10-CM | POA: Insufficient documentation

## 2017-03-30 DIAGNOSIS — R002 Palpitations: Secondary | ICD-10-CM | POA: Insufficient documentation

## 2017-03-30 DIAGNOSIS — E119 Type 2 diabetes mellitus without complications: Secondary | ICD-10-CM | POA: Diagnosis not present

## 2017-03-30 DIAGNOSIS — Z7982 Long term (current) use of aspirin: Secondary | ICD-10-CM | POA: Insufficient documentation

## 2017-03-30 DIAGNOSIS — R748 Abnormal levels of other serum enzymes: Secondary | ICD-10-CM | POA: Diagnosis not present

## 2017-03-30 DIAGNOSIS — Z79899 Other long term (current) drug therapy: Secondary | ICD-10-CM | POA: Insufficient documentation

## 2017-03-30 DIAGNOSIS — E1169 Type 2 diabetes mellitus with other specified complication: Secondary | ICD-10-CM

## 2017-03-30 DIAGNOSIS — R51 Headache: Secondary | ICD-10-CM | POA: Insufficient documentation

## 2017-03-30 DIAGNOSIS — E7800 Pure hypercholesterolemia, unspecified: Secondary | ICD-10-CM | POA: Insufficient documentation

## 2017-03-30 DIAGNOSIS — R0789 Other chest pain: Principal | ICD-10-CM | POA: Insufficient documentation

## 2017-03-30 LAB — URINALYSIS, ROUTINE W REFLEX MICROSCOPIC
Bilirubin Urine: NEGATIVE
Glucose, UA: NEGATIVE mg/dL
Hgb urine dipstick: NEGATIVE
Ketones, ur: NEGATIVE mg/dL
Nitrite: NEGATIVE
Protein, ur: 100 mg/dL — AB
Specific Gravity, Urine: 1.012 (ref 1.005–1.030)
Squamous Epithelial / LPF: NONE SEEN
pH: 6 (ref 5.0–8.0)

## 2017-03-30 LAB — CBC
HCT: 35.9 % — ABNORMAL LOW (ref 36.0–46.0)
Hemoglobin: 11.5 g/dL — ABNORMAL LOW (ref 12.0–15.0)
MCH: 26.5 pg (ref 26.0–34.0)
MCHC: 32 g/dL (ref 30.0–36.0)
MCV: 82.7 fL (ref 78.0–100.0)
Platelets: 241 10*3/uL (ref 150–400)
RBC: 4.34 MIL/uL (ref 3.87–5.11)
RDW: 14.1 % (ref 11.5–15.5)
WBC: 5.7 10*3/uL (ref 4.0–10.5)

## 2017-03-30 LAB — BASIC METABOLIC PANEL
Anion gap: 9 (ref 5–15)
BUN: 16 mg/dL (ref 6–20)
CO2: 28 mmol/L (ref 22–32)
Calcium: 8.9 mg/dL (ref 8.9–10.3)
Chloride: 105 mmol/L (ref 101–111)
Creatinine, Ser: 1.36 mg/dL — ABNORMAL HIGH (ref 0.44–1.00)
GFR calc Af Amer: 40 mL/min — ABNORMAL LOW (ref >60)
GFR calc non Af Amer: 35 mL/min — ABNORMAL LOW (ref >60)
Glucose, Bld: 153 mg/dL — ABNORMAL HIGH (ref 65–99)
Potassium: 4.3 mmol/L (ref 3.5–5.1)
Sodium: 142 mmol/L (ref 135–145)

## 2017-03-30 LAB — GLUCOSE, CAPILLARY: Glucose-Capillary: 142 mg/dL — ABNORMAL HIGH (ref 65–99)

## 2017-03-30 LAB — PROTIME-INR
INR: 0.96
Prothrombin Time: 12.7 seconds (ref 11.4–15.2)

## 2017-03-30 LAB — VITAMIN B12: Vitamin B-12: 144 pg/mL — ABNORMAL LOW (ref 180–914)

## 2017-03-30 LAB — TROPONIN I
Troponin I: 0.05 ng/mL (ref <0.03)
Troponin I: 0.04 ng/mL (ref <0.03)

## 2017-03-30 LAB — RAPID URINE DRUG SCREEN, HOSP PERFORMED
Amphetamines: NOT DETECTED
Barbiturates: NOT DETECTED
Benzodiazepines: NOT DETECTED
Cocaine: NOT DETECTED
Opiates: NOT DETECTED
Tetrahydrocannabinol: NOT DETECTED

## 2017-03-30 LAB — HEMOGLOBIN A1C
Hgb A1c MFr Bld: 6 % — ABNORMAL HIGH (ref 4.8–5.6)
Mean Plasma Glucose: 125.5 mg/dL

## 2017-03-30 LAB — ETHANOL: Alcohol, Ethyl (B): <10 mg/dL (ref <10)

## 2017-03-30 LAB — I-STAT TROPONIN, ED: Troponin i, poc: 0.02 ng/mL (ref 0.00–0.08)

## 2017-03-30 LAB — CBG MONITORING, ED: Glucose-Capillary: 138 mg/dL — ABNORMAL HIGH (ref 65–99)

## 2017-03-30 LAB — APTT: aPTT: 28 seconds (ref 24–36)

## 2017-03-30 MED ORDER — ENOXAPARIN SODIUM 30 MG/0.3ML ~~LOC~~ SOLN
30.0000 mg | SUBCUTANEOUS | Status: DC
  Administered 2017-03-30: 30 mg via SUBCUTANEOUS
  Filled 2017-03-30: qty 0.3

## 2017-03-30 MED ORDER — SODIUM CHLORIDE 0.9 % IV SOLN
INTRAVENOUS | Status: DC
  Administered 2017-03-30 (×2): via INTRAVENOUS

## 2017-03-30 MED ORDER — ENOXAPARIN SODIUM 40 MG/0.4ML ~~LOC~~ SOLN: 40.0000 mg | SUBCUTANEOUS | Status: DC

## 2017-03-30 MED ORDER — INSULIN ASPART 100 UNIT/ML ~~LOC~~ SOLN: 0.0000 [IU] | Freq: Every day | SUBCUTANEOUS | Status: DC

## 2017-03-30 MED ORDER — INSULIN ASPART 100 UNIT/ML ~~LOC~~ SOLN
0.0000 [IU] | Freq: Three times a day (TID) | SUBCUTANEOUS | Status: DC
  Administered 2017-03-31 (×2): 1 [IU] via SUBCUTANEOUS

## 2017-03-30 MED ORDER — GABAPENTIN 300 MG PO CAPS
300.0000 mg | ORAL_CAPSULE | Freq: Three times a day (TID) | ORAL | Status: DC
  Administered 2017-03-30 – 2017-03-31 (×3): 300 mg via ORAL
  Filled 2017-03-30 (×3): qty 1

## 2017-03-30 MED ORDER — HYDRALAZINE HCL 20 MG/ML IJ SOLN: 10.0000 mg | Freq: Four times a day (QID) | INTRAMUSCULAR | Status: DC | PRN

## 2017-03-30 MED ORDER — ATENOLOL 25 MG PO TABS
25.0000 mg | ORAL_TABLET | Freq: Every day | ORAL | Status: DC
  Administered 2017-03-30: 25 mg via ORAL
  Filled 2017-03-30: qty 1

## 2017-03-30 MED ORDER — LABETALOL HCL 5 MG/ML IV SOLN
10.0000 mg | Freq: Once | INTRAVENOUS | Status: AC
  Administered 2017-03-30: 10 mg via INTRAVENOUS
  Filled 2017-03-30: qty 4

## 2017-03-30 MED ORDER — PRAVASTATIN SODIUM 20 MG PO TABS
20.0000 mg | ORAL_TABLET | Freq: Every day | ORAL | Status: DC
  Administered 2017-03-30 – 2017-03-31 (×2): 20 mg via ORAL
  Filled 2017-03-30 (×2): qty 1

## 2017-03-30 NOTE — ED Provider Notes (Signed)
[facility]-EMERGENCY DEPT Provider Note  CSN: [id number] Arrival date & time: 03/30/17 0845  Chief Complaint(s) Dizziness  HPI Jamie Mueller is a 84 y.o. female   The history is provided by the patient.  Dizziness  Quality:  Lightheadedness Severity:  Moderate Onset quality:  Gradual Duration: approx 12 hrs. Timing:  Constant Progression:  Waxing and waning Chronicity:  New Relieved by:  None tried Worsened by:  Nothing Associated symptoms: chest pain (tightness), nausea and vomiting (one episode this am)   Associated symptoms: no syncope   Chest Pain   This is a new problem. The current episode started 6 to 12 hours ago. The problem occurs constantly. The problem has been resolved. The pain is present in the substernal region. The pain is mild. Quality: tightness. The pain does not radiate. Associated symptoms include dizziness, nausea and vomiting (one episode this am). Pertinent negatives include no cough and no syncope.  Her past medical history is significant for diabetes, hyperlipidemia and hypertension.  Pertinent negatives for past medical history include no CHF, no DVT, no MI, no PE, no strokes and no TIA.    Past Medical History Past Medical History:  Diagnosis Date  . Diabetes mellitus without complication (HCC)   . Hypercholesteremia   . Hypertension    benign  . Memory loss   . Vitamin B 12 deficiency    There are no active problems to display for this patient.  Home Medication(s) Prior to Admission medications   Medication Sig Start Date End Date Taking? Authorizing Provider  aspirin EC 81 MG tablet Take 81 mg by mouth daily.   Yes [provider]  atenolol (TENORMIN) 50 MG tablet TK 1 T PO QD 11/05/14  Yes [provider]  chlorthalidone (HYGROTON) 25 MG tablet TK 1 T PO QAM 12/15/14  Yes [provider]  enalapril (VASOTEC) 10 MG tablet TK 1 T PO QD 12/05/14  Yes [provider]  gabapentin  (NEURONTIN) 300 MG capsule Take 300 mg by mouth 3 (three) times daily.   Yes [provider]  metFORMIN (GLUCOPHAGE) 500 MG tablet TK 2 TS PO  BID 10/16/14  Yes [provider]  pravastatin (PRAVACHOL) 40 MG tablet TK 1 T PO QD 11/16/14  Yes [provider]                                                                                                                                    Past Surgical History Past Surgical History:  Procedure Laterality Date  . ABDOMINAL HYSTERECTOMY    . CATARACT EXTRACTION Left 03/2015   Family History Family History  Problem Relation Age of Onset  . Cancer Father        lung    Social History Social History   Tobacco Use  . Smoking status: Never Smoker  . Smokeless tobacco: Never Used  Substance Use Topics  . Alcohol  use: No    Alcohol/week: 0.0 oz  . Drug use: No   Allergies Patient has no known allergies.  Review of Systems Review of Systems  Respiratory: Negative for cough.   Cardiovascular: Positive for chest pain (tightness). Negative for syncope.  Gastrointestinal: Positive for nausea and vomiting (one episode this am).  Neurological: Positive for dizziness.   All other systems are reviewed and are negative for acute change except as noted in the HPI  Physical Exam Vital Signs  I have reviewed the triage vital signs BP (!) 196/88 (BP Location: Right Arm)   Pulse 69   Temp 97.8 F (36.6 C) (Oral)   Resp 12   Ht 5\' 3"  (1.6 m)   Wt 59 kg (130 lb)   SpO2 100%   BMI 23.03 kg/m   Physical Exam  Constitutional: She is oriented to person, place, and time. She appears well-developed and well-nourished. No distress.  HENT:  Head: Normocephalic and atraumatic.  Nose: Nose normal.  Eyes: Conjunctivae and EOM are normal. Pupils are equal, round, and reactive to light. Right eye exhibits no discharge. Left eye exhibits no discharge. No scleral icterus.  Neck: Normal range of motion. Neck supple.    Cardiovascular: Normal rate. An irregularly irregular rhythm present. Exam reveals no gallop and no friction rub.  No murmur heard. Pulmonary/Chest: Effort normal and breath sounds normal. No stridor. No respiratory distress. She has no rales.  Abdominal: Soft. She exhibits no distension. There is no tenderness.  Musculoskeletal: She exhibits no edema or tenderness.  Neurological: She is alert and oriented to person, place, and time.  Mental Status:  Alert and oriented to person, place, and time.  Attention and concentration normal.  Speech clear.  Recent memory is intact  Cranial Nerves:  II Visual Fields: Intact to confrontation. Visual fields intact. III, IV, VI: Pupils equal and reactive to light and near. Full eye movement without nystagmus  V Facial Sensation: Normal. No weakness of masticatory muscles  VII: No facial weakness or asymmetry  VIII Auditory Acuity: Grossly normal  IX/X: The uvula is midline; the palate elevates symmetrically  XI: Normal sternocleidomastoid and trapezius strength  XII: The tongue is midline. No atrophy or fasciculations.   Motor System: Muscle Strength: 5/5 and symmetric in the upper and lower extremities. No pronation or drift.  Muscle Tone: Tone and muscle bulk are normal in the upper and lower extremities.   Reflexes: DTRs: 1+ and symmetrical in all four extremities. No Clonus Coordination: Intact finger-to-nose, heel-to-shin. No tremor.  Sensation: Intact to light touch, and pinprick.  Gait: deferred  HINTS: Nystagmus: none Head impulse: normal Skew: abnormal with right eye   Skin: Skin is warm and dry. No rash noted. She is not diaphoretic. No erythema.  Psychiatric: She has a normal mood and affect.  Vitals reviewed.   ED Results and Treatments Labs (all labs ordered are listed, but only abnormal results are displayed) Labs Reviewed  BASIC METABOLIC PANEL - Abnormal; Notable for the following components:      Result Value    Glucose, Bld 153 (*)    Creatinine, Ser 1.36 (*)    GFR calc non Af Amer 35 (*)    GFR calc Af Amer 40 (*)    All other components within normal limits  CBC - Abnormal; Notable for the following components:   Hemoglobin 11.5 (*)    HCT 35.9 (*)    All other components within normal limits  URINALYSIS, ROUTINE W REFLEX MICROSCOPIC -  Abnormal; Notable for the following components:   Protein, ur 100 (*)    Leukocytes, UA SMALL (*)    Bacteria, UA RARE (*)    All other components within normal limits  CBG MONITORING, ED - Abnormal; Notable for the following components:   Glucose-Capillary 138 (*)    All other components within normal limits  ETHANOL  PROTIME-INR  APTT  RAPID URINE DRUG SCREEN, HOSP PERFORMED  I-STAT CHEM 8, ED  I-STAT TROPONIN, ED                                                                                                                         EKG  EKG Interpretation  Date/Time:  Wednesday March 30 2017 09:08:03 EDT Ventricular Rate:  79 PR Interval:    QRS Duration: 71 QT Interval:  392 QTC Calculation: 450 R Axis:   46 Text Interpretation:  Unknown rhythm, irregular rate Probable anteroseptal infarct, old Nonspecific T wave abnormality, improved in III Otherwise no significant change Confirmed by Drema Pry [phone]) on 03/30/2017 11:27:05 AM      Radiology Ct Head Wo Contrast  Result Date: 03/30/2017 CLINICAL DATA:  Dizziness EXAM: CT HEAD WITHOUT CONTRAST TECHNIQUE: Contiguous axial images were obtained from the base of the skull through the vertex without intravenous contrast. COMPARISON:  04/05/2006 FINDINGS: Brain: There is no evidence for acute hemorrhage, hydrocephalus, mass lesion, or abnormal extra-axial fluid collection. No definite CT evidence for acute infarction. Diffuse loss of parenchymal volume is consistent with atrophy. Patchy low attenuation in the deep hemispheric and periventricular white matter is nonspecific, but likely reflects  chronic microvascular ischemic demyelination. Vascular: No hyperdense vessel or unexpected calcification. Skull: Normal. Negative for fracture or focal lesion. Sinuses/Orbits: No acute finding. Other: None. IMPRESSION: No acute intracranial abnormality. Atrophy with chronic small vessel white matter ischemic disease. Electronically Signed   By: Kennith Center M.D.   On: 03/30/2017 12:12   Pertinent labs & imaging results that were available during my care of the patient were reviewed by me and considered in my medical decision making (see chart for details).  Medications Ordered in ED Medications  labetalol (NORMODYNE,TRANDATE) injection 10 mg (not administered)                                                                                                                                    Procedures Procedures  (including critical care time)  Medical Decision Making / ED Course I have reviewed the nursing notes for this encounter and the patient's prior records (if available in EHR or on provided paperwork).    EKG without evidence of acute ischemic changes.  Noted to have possible ectopic atrial beats with irregularly irregular rhythm.  Initial troponin negative.  Heart score greater than 4.  Patient will require admission for ACS rule out related to her chest tightness. Will discuss dysrhythmia with cardiology.  Additionally CT head was obtained to rule out possible CVA/TIA given the patient's abnormal hints exam, and reported lightheadedness.  CT was negative.  Given the patient's ABCD2 score of 5, I feel she would need TIA work up. Will discuss with neurology.  Discussed the case with Dr. Blake DivineAkula from hospitalist service who will admit the patient for continued workup and management.  Final Clinical Impression(s) / ED Diagnoses Final diagnoses:  Chest tightness  Dizziness      This chart was dictated using voice recognition software.  Despite best efforts to proofread,  errors can  occur which can change the documentation meaning.   Nira Connardama, Shashana Fullington Eduardo, MD 03/30/17 [phone]

## 2017-03-30 NOTE — ED Notes (Signed)
ED TO INPATIENT HANDOFF REPORT  Name/Age/Gender Jamie Mueller 84 y.o. female  Code Status Code Status History    This patient does not have a recorded code status. Please follow your organizational policy for patients in this situation.      Home/SNF/Other Home  Chief Complaint dizzy / emesis   Level of Care/Admitting Diagnosis ED Disposition    ED Disposition Condition Comment   Admit  Hospital Area: [facility] [[id number]]  Level of Care: Telemetry [5]  Admit to tele based on following criteria: Complex arrhythmia (Bradycardia/Tachycardia)  Admit to tele based on following criteria: Monitor for Ischemic changes  Diagnosis: Chest discomfort [254982]  Admitting Physician: Hosie Poisson [[id number]]  Attending Physician: Hosie Poisson [[id number]]  PT Class (Do Not Modify): Observation [104]  PT Acc Code (Do Not Modify): Observation [10022]       Medical History Past Medical History:  Diagnosis Date  . Diabetes mellitus without complication ([place])   . Hypercholesteremia   . Hypertension    benign  . Memory loss   . Vitamin B 12 deficiency     Allergies No Known Allergies  IV Location/Drains/Wounds Patient Lines/Drains/Airways Status   Active Line/Drains/Airways    Name:   Placement date:   Placement time:   Site:   Days:   Peripheral IV 03/30/17 Antecubital   03/30/17    1219    Antecubital   less than 1          Labs/Imaging Results for orders placed or performed during the hospital encounter of 03/30/17 (from the past 48 hour(s))  CBG monitoring, ED     Status: Abnormal   Collection Time: 03/30/17  9:10 AM  Result Value Ref Range   Glucose-Capillary 138 (H) 65 - 99 mg/dL  Basic metabolic panel     Status: Abnormal   Collection Time: 03/30/17 10:16 AM  Result Value Ref Range   Sodium 142 135 - 145 mmol/L   Potassium 4.3 3.5 - 5.1 mmol/L   Chloride 105 101 - 111 mmol/L   CO2 28 22 - 32 mmol/L   Glucose, Bld 153 (H) 65 - 99 mg/dL   BUN 16  6 - 20 mg/dL   Creatinine, Ser 1.36 (H) 0.44 - 1.00 mg/dL   Calcium 8.9 8.9 - 10.3 mg/dL   GFR calc non Af Amer 35 (L) >60 mL/min   GFR calc Af Amer 40 (L) >60 mL/min    Comment: (NOTE) The eGFR has been calculated using the CKD EPI equation. This calculation has not been validated in all clinical situations. eGFR's persistently <60 mL/min signify possible Chronic Kidney Disease.    Anion gap 9 5 - 15    Comment: Performed at [facility], [place] [address], [place], [place] [address]  CBC     Status: Abnormal   Collection Time: 03/30/17 10:16 AM  Result Value Ref Range   WBC 5.7 4.0 - 10.5 K/uL   RBC 4.34 3.87 - 5.11 MIL/uL   Hemoglobin 11.5 (L) 12.0 - 15.0 g/dL   HCT 35.9 (L) 36.0 - 46.0 %   MCV 82.7 78.0 - 100.0 fL   MCH 26.5 26.0 - 34.0 pg   MCHC 32.0 30.0 - 36.0 g/dL   RDW 14.1 11.5 - 15.5 %   Platelets 241 150 - 400 K/uL    Comment: Performed at [facility], [place] [address], [place], [place] [address]  Protime-INR     Status: None   Collection Time: 03/30/17  10:16 AM  Result Value Ref Range   Prothrombin Time 12.7 11.4 - 15.2 seconds   INR 0.96     Comment: Performed at [facility], [place] [address], [place], [place] [address]  APTT     Status: None   Collection Time: 03/30/17 10:16 AM  Result Value Ref Range   aPTT 28 24 - 36 seconds    Comment: Performed at [facility], [place] [address] [place], [place] [address]  Urinalysis, Routine w reflex microscopic     Status: Abnormal   Collection Time: 03/30/17 11:36 AM  Result Value Ref Range   Color, Urine YELLOW YELLOW   APPearance CLEAR CLEAR   Specific Gravity, Urine 1.012 1.005 - 1.030   pH 6.0 5.0 - 8.0   Glucose, UA NEGATIVE NEGATIVE mg/dL   Hgb urine dipstick NEGATIVE NEGATIVE   Bilirubin Urine NEGATIVE NEGATIVE   Ketones, ur NEGATIVE NEGATIVE mg/dL   Protein, ur 100 (A) NEGATIVE mg/dL   Nitrite NEGATIVE NEGATIVE   Leukocytes, UA SMALL  (A) NEGATIVE   RBC / HPF 0-5 0 - 5 RBC/hpf   WBC, UA 0-5 0 - 5 WBC/hpf   Bacteria, UA RARE (A) NONE SEEN   Squamous Epithelial / LPF NONE SEEN NONE SEEN    Comment: Performed at [facility], [place] [address] [place], [place] [address]  Urine rapid drug screen (hosp performed)     Status: None   Collection Time: 03/30/17 11:36 AM  Result Value Ref Range   Opiates NONE DETECTED NONE DETECTED   Cocaine NONE DETECTED NONE DETECTED   Benzodiazepines NONE DETECTED NONE DETECTED   Amphetamines NONE DETECTED NONE DETECTED   Tetrahydrocannabinol NONE DETECTED NONE DETECTED   Barbiturates NONE DETECTED NONE DETECTED    Comment: (NOTE) DRUG SCREEN FOR MEDICAL PURPOSES ONLY.  IF CONFIRMATION IS NEEDED FOR ANY PURPOSE, NOTIFY LAB WITHIN 5 DAYS. LOWEST DETECTABLE LIMITS FOR URINE DRUG SCREEN Drug Class                     Cutoff (ng/mL) Amphetamine and metabolites    1000 Barbiturate and metabolites    200 Benzodiazepine                 621 Tricyclics and metabolites     300 Opiates and metabolites        300 Cocaine and metabolites        300 THC                            50 Performed at [facility], [place] [address], [place], [place] [address]   Ethanol     Status: None   Collection Time: 03/30/17 12:15 PM  Result Value Ref Range   Alcohol, Ethyl (B) <10 <10 mg/dL    Comment:        LOWEST DETECTABLE LIMIT FOR SERUM ALCOHOL IS 10 mg/dL FOR MEDICAL PURPOSES ONLY Performed at [place] [address] [place], [place] [address]   I-stat troponin, ED     Status: None   Collection Time: 03/30/17 12:19 PM  Result Value Ref Range   Troponin i, poc 0.02 0.00 - 0.08 ng/mL   Comment 3            Comment: Due to the release kinetics of cTnI, a negative result within the first hours of the onset of symptoms does not rule out  myocardial infarction with certainty. If myocardial infarction is still suspected, repeat the test at  appropriate intervals.    Ct Head Wo Contrast  Result Date: 03/30/2017 CLINICAL DATA:  Dizziness EXAM: CT HEAD WITHOUT CONTRAST TECHNIQUE: Contiguous axial images were obtained from the base of the skull through the vertex without intravenous contrast. COMPARISON:  04/05/2006 FINDINGS: Brain: There is no evidence for acute hemorrhage, hydrocephalus, mass lesion, or abnormal extra-axial fluid collection. No definite CT evidence for acute infarction. Diffuse loss of parenchymal volume is consistent with atrophy. Patchy low attenuation in the deep hemispheric and periventricular white matter is nonspecific, but likely reflects chronic microvascular ischemic demyelination. Vascular: No hyperdense vessel or unexpected calcification. Skull: Normal. Negative for fracture or focal lesion. Sinuses/Orbits: No acute finding. Other: None. IMPRESSION: No acute intracranial abnormality. Atrophy with chronic small vessel white matter ischemic disease. Electronically Signed   By: Misty Stanley M.D.   On: 03/30/2017 12:12    Pending Labs Unresulted Labs (From admission, onward)   Start     Ordered   03/30/17 1409  Troponin I  Now then every 6 hours,   R     03/30/17 1408   Signed and Held  Creatinine, serum  (enoxaparin (LOVENOX)    CrCl >/= 30 ml/min)  Weekly,   R    Comments:  while on enoxaparin therapy    Signed and Held      Vitals/Pain Today's Vitals   03/30/17 1308 03/30/17 1500 03/30/17 1613 03/30/17 1630  BP:  (!) 143/74 (!) 174/90 (!) 193/76  Pulse: 72 73 75 78  Resp: 15 (!) 21 (!) 23 18  Temp:      TempSrc:      SpO2: 99% 97% 100% 100%  Weight:      Height:        Isolation Precautions No active isolations  Medications Medications  labetalol (NORMODYNE,TRANDATE) injection 10 mg (10 mg Intravenous Given 03/30/17 1441)    Mobility walks with person assist

## 2017-03-30 NOTE — H&P (Signed)
History and Physical    Jamie Mueller:[id number] DOB: [birth year] DOA: 03/30/2017  PCP: Renford Dills, MD  Patient coming from: Home.   I have personally briefly reviewed patient's old medical records in [facility]  Chief Complaint: chest discomfort.   HPI: Jamie Mueller is a 84 y.o. female with medical history significant of hypertension, hyperlipidemia, diabetes mellitus, presents today with some chest discomfort at home, at rest, resolved spontaneously, associated with  Nausea, vomiting( dry heaving) , dizziness, presyncopal , palpitations and  headache. No syncope at home or in the past. No sob, or cough, fever or chills. No abd pain, diarrhea, or urinary symptoms. No sensory deficits or weakness as per the patient. Pt reports all her symptoms on arrival to ED.  She was referred to medical service for admission for evaluation of the above.  Neurology and cardiology consulted by EDP.    ED Course: ON ARRIVAL TO ED, her bp was in 200's over 90's. Lab work revealed creatinine of 1.36, and mildly elevatd troponin of 0.05 and hemoglobin of 11. 5. EKG does not show any ischemic changes. CT head without contrast unremarkable. uds is negative. UA is negative for infection.   Review of Systems: As per HPI otherwise 10 point review of systems negative.    Past Medical History:  Diagnosis Date  . Diabetes mellitus without complication (HCC)   . Hypercholesteremia   . Hypertension    benign  . Memory loss   . Vitamin B 12 deficiency     Past Surgical History:  Procedure Laterality Date  . ABDOMINAL HYSTERECTOMY    . CATARACT EXTRACTION Left 03/2015     reports that  has never smoked. she has never used smokeless tobacco. She reports that she does not drink alcohol or use drugs.  No Known Allergies  Family History  Problem Relation Age of Onset  . Cancer Father        lung   Family history reviewed.   Prior to Admission medications   Medication Sig Start  Date End Date Taking? Authorizing Provider  aspirin EC 81 MG tablet Take 81 mg by mouth daily.   Yes [provider]  atenolol (TENORMIN) 50 MG tablet TK 1 T PO QD 11/05/14  Yes [provider]  chlorthalidone (HYGROTON) 25 MG tablet TK 1 T PO QAM 12/15/14  Yes [provider]  enalapril (VASOTEC) 10 MG tablet TK 1 T PO QD 12/05/14  Yes [provider]  gabapentin (NEURONTIN) 300 MG capsule Take 300 mg by mouth 3 (three) times daily.   Yes [provider]  metFORMIN (GLUCOPHAGE) 500 MG tablet TK 2 TS PO  BID 10/16/14  Yes [provider]  pravastatin (PRAVACHOL) 40 MG tablet TK 1 T PO QD 11/16/14  Yes [provider]    Physical Exam: Vitals:   03/30/17 1531 03/30/17 1613 03/30/17 1630 03/30/17 1754  BP: (!) 147/100 (!) 174/90 (!) 193/76 (!) 172/74  Pulse: 78 75 78 88  Resp: 16 (!) 23 18 18   Temp:      TempSrc:      SpO2: 100% 100% 100% 100%  Weight:      Height:        Constitutional: NAD, calm, comfortable Vitals:   03/30/17 1531 03/30/17 1613 03/30/17 1630 03/30/17 1754  BP: (!) 147/100 (!) 174/90 (!) 193/76 (!) 172/74  Pulse: 78 75 78 88  Resp: 16 (!) 23 18 18   Temp:      TempSrc:  SpO2: 100% 100% 100% 100%  Weight:      Height:       Eyes: PERRL, lids and conjunctivae normal ENMT: Mucous membranes are moist. Posterior pharynx clear of any exudate or lesions.Normal dentition.  Neck: normal, supple, no masses, no thyromegaly Respiratory: clear to auscultation bilaterally, no wheezing, no crackles. Normal respiratory effort. No accessory muscle use.  Cardiovascular: Regular rate and rhythm, no murmurs . No extremity edema. 2+ pedal pulses. No carotid bruits.  Abdomen: no tenderness, no masses palpated. No hepatosplenomegaly. Bowel sounds positive.  Musculoskeletal: no clubbing / cyanosis. No joint deformity upper and lower extremities.  Skin: no rashes, lesions, ulcers. No induration Neurologic: CN 2-12  grossly intact. Sensation intact,  Strength 5/5 in all 4. Hard of hearing.  Psychiatric: Normal judgment and insight. Alert and oriented x 3. Normal mood.     Labs on Admission: I have personally reviewed following labs and imaging studies  CBC: Recent Labs  Lab 03/30/17 1016  WBC 5.7  HGB 11.5*  HCT 35.9*  MCV 82.7  PLT 241   Basic Metabolic Panel: Recent Labs  Lab 03/30/17 1016  NA 142  K 4.3  CL 105  CO2 28  GLUCOSE 153*  BUN 16  CREATININE 1.36*  CALCIUM 8.9   GFR: Estimated Creatinine Clearance: 25.5 mL/min (A) (by C-G formula based on SCr of 1.36 mg/dL (H)). Liver Function Tests: No results for input(s): AST, ALT, ALKPHOS, BILITOT, PROT, ALBUMIN in the last 168 hours. No results for input(s): LIPASE, AMYLASE in the last 168 hours. No results for input(s): AMMONIA in the last 168 hours. Coagulation Profile: Recent Labs  Lab 03/30/17 1016  INR 0.96   Cardiac Enzymes: Recent Labs  Lab 03/30/17 1712  TROPONINI 0.05*   BNP (last 3 results) No results for input(s): PROBNP in the last 8760 hours. HbA1C: No results for input(s): HGBA1C in the last 72 hours. CBG: Recent Labs  Lab 03/30/17 0910  GLUCAP 138*   Lipid Profile: No results for input(s): CHOL, HDL, LDLCALC, TRIG, CHOLHDL, LDLDIRECT in the last 72 hours. Thyroid Function Tests: No results for input(s): TSH, T4TOTAL, FREET4, T3FREE, THYROIDAB in the last 72 hours. Anemia Panel: No results for input(s): VITAMINB12, FOLATE, FERRITIN, TIBC, IRON, RETICCTPCT in the last 72 hours. Urine analysis:    Component Value Date/Time   COLORURINE YELLOW 03/30/2017 1136   APPEARANCEUR CLEAR 03/30/2017 1136   LABSPEC 1.012 03/30/2017 1136   PHURINE 6.0 03/30/2017 1136   GLUCOSEU NEGATIVE 03/30/2017 1136   HGBUR NEGATIVE 03/30/2017 1136   BILIRUBINUR NEGATIVE 03/30/2017 1136   KETONESUR NEGATIVE 03/30/2017 1136   PROTEINUR 100 (A) 03/30/2017 1136   UROBILINOGEN 0.2 10/29/2010 1614   NITRITE NEGATIVE  03/30/2017 1136   LEUKOCYTESUR SMALL (A) 03/30/2017 1136    Radiological Exams on Admission: Ct Head Wo Contrast  Result Date: 03/30/2017 CLINICAL DATA:  Dizziness EXAM: CT HEAD WITHOUT CONTRAST TECHNIQUE: Contiguous axial images were obtained from the base of the skull through the vertex without intravenous contrast. COMPARISON:  04/05/2006 FINDINGS: Brain: There is no evidence for acute hemorrhage, hydrocephalus, mass lesion, or abnormal extra-axial fluid collection. No definite CT evidence for acute infarction. Diffuse loss of parenchymal volume is consistent with atrophy. Patchy low attenuation in the deep hemispheric and periventricular white matter is nonspecific, but likely reflects chronic microvascular ischemic demyelination. Vascular: No hyperdense vessel or unexpected calcification. Skull: Normal. Negative for fracture or focal lesion. Sinuses/Orbits: No acute finding. Other: None. IMPRESSION: No acute intracranial abnormality. Atrophy with chronic  small vessel white matter ischemic disease. Electronically Signed   By: Kennith Center M.D.   On: 03/30/2017 12:12    EKG: Independently reviewed. arrhythmia at 78/min.   Assessment/Plan Active Problems:   Chest discomfort   Essential hypertension   Type 2 diabetes mellitus without complication (HCC)   Hyperlipidemia     Chest discomfort associated with nausea, vomiting, palpitations and dizziness:  evaluate for ACS,  Admit to telemetry, serial troponins, echocardiogram.  ekg in am.  Cardiology consulted by EDP.     DIZZINESS, pre syncope: Evaluate for TIA.  CT unremarkable.  Get MRI brain  Further recommendations as per neurology.     Hyperlipidemia:  Lipid panel in am.    Type 2 DM:  CBG (last 3)  Recent Labs    03/30/17 0910  GLUCAP 138*   HGBA1C Pending.  Resume SSI.    Hypertensive urgency:  Restart home meds and add hydralazine prn .    Acute renal failure: baseline creatinine less than 1.  Today its  1.36 , could be pre renal, dehydration, would explain dizziness Hydrate and repeat renal parameters in am.     DVT prophylaxis: lovenox.  Code Status: full code.  Family Communication: family at bedside.  Disposition Plan: pending echo and MRI.  Consults called:NEUROLOGY AND CARDIOLOGY.  Admission status: obs tele.    Kathlen Mody MD [facility] Pager [phone]  If 7PM-7AM, please contact night-coverage [email or web address] Password [facility]  03/30/2017, 6:00 PM

## 2017-03-30 NOTE — ED Triage Notes (Signed)
Pt verbalizes dizziness throughout the night; one episode of vomiting. Denies weakness, numbness, or other symptoms.

## 2017-03-30 NOTE — ED Notes (Signed)
Pt is alert and oriented x 4 and is verbally responsive. Pt denies pain at this time. Pt remains to have intermittent dizziness, and with HTN last BP 207/83, pt reports that she had not taken medication for management today. Dr. Eudelia Bunchardama has been made aware.

## 2017-03-30 NOTE — Consult Note (Signed)
NEURO HOSPITALIST CONSULT NOTE   Requesting physician: Dr. Eudelia Bunch  Reason for Consult: Dizziness  History obtained from:  Patient, Family and Chart  HPI:                                                                                                                                          Jamie Mueller is an 84 y.o. female who presented to the ED after having new onset dizziness at home with one episode of vomiting. She denies a vertiginous or spinning dizziness, nor a sensation of unsteadiness, but states she felt presyncopal. She was also worried about an 8/10 bifrontal/periorbital headache beginning at about 6 AM, which was throbbing. Following onset of the headache she had numbness of her hands bilaterally and difficulty walking due to new onset of a feeling of weakness in both legs in conjunction with bilateral foot pain. Per Dr. Harland Dingwall note, HINTS exam was positive. CT head was unremarkable. Given an ABCD2 score of 5, Dr. Eudelia Bunch felt that she would need aTIA work up.   Past Medical History:  Diagnosis Date  . Diabetes mellitus without complication (HCC)   . Hypercholesteremia   . Hypertension    benign  . Memory loss   . Vitamin B 12 deficiency     Past Surgical History:  Procedure Laterality Date  . ABDOMINAL HYSTERECTOMY    . CATARACT EXTRACTION Left 03/2015    Family History  Problem Relation Age of Onset  . Cancer Father        lung            Social History:  reports that  has never smoked. she has never used smokeless tobacco. She reports that she does not drink alcohol or use drugs.  No Known Allergies  MEDICATIONS:                                                                                                                     Current Meds  Medication Sig  . aspirin EC 81 MG tablet Take 81 mg by mouth daily.  Marland Kitchen atenolol (TENORMIN) 50 MG tablet TK 1 T PO QD  . chlorthalidone (HYGROTON) 25 MG tablet TK 1 T PO QAM  . enalapril  (VASOTEC) 10 MG tablet TK 1 T PO  QD  . gabapentin (NEURONTIN) 300 MG capsule Take 300 mg by mouth 3 (three) times daily.  . metFORMIN (GLUCOPHAGE) 500 MG tablet TK 2 TS PO  BID  . pravastatin (PRAVACHOL) 40 MG tablet TK 1 T PO QD    ROS:                                                                                                                                       No chest pain, vision loss, speech changes, incoordination or limb weakness. Other ROS as per HPI.   Blood pressure (!) 174/90, pulse 75, temperature 97.8 F (36.6 C), temperature source Oral, resp. rate (!) 23, height 5\' 3"  (1.6 m), weight 59 kg (130 lb), SpO2 100 %.   General Examination:                                                                                                      HEENT-  [place]/AT   Lungs- Respirations unlabored Extremities- No edema  Neurological Examination Mental Status: Intact to complex questions and commands. Cranial Nerves: II: Visual fields intact. PERRL.   III,IV, VI: EOMI without nystagmus. No ptosis.  V,VII: Smile symmetric, facial temp sensation normal bilaterally VIII: hearing intact to voice IX,X: No hypophonia or hoarseness XI: Symmetric XII: midline tongue extension Motor: Right : Upper extremity   5/5    Left:     Upper extremity   5/5  Lower extremity   5/5     Lower extremity   5/5 Tone and bulk:normal tone throughout; no atrophy noted Sensory: Temp and light touch intact throughout, bilaterally Deep Tendon Reflexes: 2+ bilateral upper ext. Low amplitude right patellar with subtle crossed adductor response (4+). Low amplitude right patellar with 3 subtle beats of clonus (4+). Ankles 0 bilaterally  Plantars: Right: downgoing  Left: Equivocal Cerebellar: No ataxia with FNF bilaterally Gait: Deferred   Lab Results: Basic Metabolic Panel: Recent Labs  Lab 03/30/17 1016  NA 142  K 4.3  CL 105  CO2 28  GLUCOSE 153*  BUN 16  CREATININE 1.36*  CALCIUM 8.9     CBC: Recent Labs  Lab 03/30/17 1016  WBC 5.7  HGB 11.5*  HCT 35.9*  MCV 82.7  PLT 241    Cardiac Enzymes: No results for input(s): CKTOTAL, CKMB, CKMBINDEX, TROPONINI in the last 168 hours.  Lipid Panel: No results for input(s): CHOL, TRIG, HDL, CHOLHDL, VLDL, LDLCALC in the last 168 hours.  Imaging: Ct Head Wo Contrast  Result Date: 03/30/2017 CLINICAL DATA:  Dizziness EXAM: CT HEAD WITHOUT CONTRAST TECHNIQUE: Contiguous axial images were obtained from the base of the skull through the vertex without intravenous contrast. COMPARISON:  04/05/2006 FINDINGS: Brain: There is no evidence for acute hemorrhage, hydrocephalus, mass lesion, or abnormal extra-axial fluid collection. No definite CT evidence for acute infarction. Diffuse loss of parenchymal volume is consistent with atrophy. Patchy low attenuation in the deep hemispheric and periventricular white matter is nonspecific, but likely reflects chronic microvascular ischemic demyelination. Vascular: No hyperdense vessel or unexpected calcification. Skull: Normal. Negative for fracture or focal lesion. Sinuses/Orbits: No acute finding. Other: None. IMPRESSION: No acute intracranial abnormality. Atrophy with chronic small vessel white matter ischemic disease. Electronically Signed   By: Kennith Center M.D.   On: 03/30/2017 12:12   Assessment: 84 year old female with multiple neurological symptoms. No single CNS or PNS localization can be inferred from the separate symptoms and findings 1. Presyncope: May be due to orthostatic hypotension or volume depletion 2. Foot pain: Most likely due to arthritis 3. Denies vertigo 4. Hyperactive patellar reflexes. DDx includes an old cord lesion or diffuse myelopathy such as secondary to B12 deficiency.  5. Severe headache. CT head unremarkable.  Recommendations: 1. Consider obtaining an MRI of the cervical and thoracic spine to evaluate the hyperactive patellar reflexes. Also would obtain a B12  level.  2. MRI brain, MRA head, carotid ultrasound, TTE 3. Phenergan 12.5 mg IV x 1 4. Gentle IV hydration in addition to encouraging PO hydration 5. Orthostatics 6. Continue ASA and pravachol  Electronically signed: Dr. Caryl Pina 03/30/2017, 4:54 PM

## 2017-03-30 NOTE — ED Notes (Signed)
Date and time results received: 03/30/17 1800  (use smartphrase ".now" to insert current time)  Test: Toponin Critical Value: 0.05  Name of Provider Notified: Celestine, RN  Orders Received? Or Actions Taken?:

## 2017-03-30 NOTE — ED Notes (Signed)
Bed: WA05 Expected date:  Expected time:  Means of arrival:  Comments: 

## 2017-03-31 ENCOUNTER — Observation Stay (HOSPITAL_BASED_OUTPATIENT_CLINIC_OR_DEPARTMENT_OTHER): Payer: Medicare Other

## 2017-03-31 ENCOUNTER — Observation Stay (HOSPITAL_COMMUNITY): Payer: Medicare Other

## 2017-03-31 DIAGNOSIS — I480 Paroxysmal atrial fibrillation: Secondary | ICD-10-CM | POA: Diagnosis not present

## 2017-03-31 DIAGNOSIS — R42 Dizziness and giddiness: Secondary | ICD-10-CM

## 2017-03-31 DIAGNOSIS — R748 Abnormal levels of other serum enzymes: Secondary | ICD-10-CM | POA: Diagnosis not present

## 2017-03-31 DIAGNOSIS — R0789 Other chest pain: Secondary | ICD-10-CM | POA: Diagnosis not present

## 2017-03-31 DIAGNOSIS — E785 Hyperlipidemia, unspecified: Secondary | ICD-10-CM

## 2017-03-31 DIAGNOSIS — E119 Type 2 diabetes mellitus without complications: Secondary | ICD-10-CM | POA: Diagnosis not present

## 2017-03-31 DIAGNOSIS — R55 Syncope and collapse: Secondary | ICD-10-CM | POA: Diagnosis not present

## 2017-03-31 DIAGNOSIS — R002 Palpitations: Secondary | ICD-10-CM | POA: Diagnosis not present

## 2017-03-31 LAB — CBC WITH DIFFERENTIAL/PLATELET
Basophils Absolute: 0 10*3/uL (ref 0.0–0.1)
Basophils Relative: 1 %
Eosinophils Absolute: 0.1 10*3/uL (ref 0.0–0.7)
Eosinophils Relative: 1 %
HCT: 34.1 % — ABNORMAL LOW (ref 36.0–46.0)
Hemoglobin: 11.1 g/dL — ABNORMAL LOW (ref 12.0–15.0)
Lymphocytes Relative: 39 %
Lymphs Abs: 2.3 10*3/uL (ref 0.7–4.0)
MCH: 26.7 pg (ref 26.0–34.0)
MCHC: 32.6 g/dL (ref 30.0–36.0)
MCV: 82.2 fL (ref 78.0–100.0)
Monocytes Absolute: 0.4 10*3/uL (ref 0.1–1.0)
Monocytes Relative: 7 %
Neutro Abs: 3 10*3/uL (ref 1.7–7.7)
Neutrophils Relative %: 52 %
Platelets: 223 10*3/uL (ref 150–400)
RBC: 4.15 MIL/uL (ref 3.87–5.11)
RDW: 14.1 % (ref 11.5–15.5)
WBC: 5.9 10*3/uL (ref 4.0–10.5)

## 2017-03-31 LAB — COMPREHENSIVE METABOLIC PANEL
ALT: 11 U/L — ABNORMAL LOW (ref 14–54)
AST: 23 U/L (ref 15–41)
Albumin: 3.2 g/dL — ABNORMAL LOW (ref 3.5–5.0)
Alkaline Phosphatase: 55 U/L (ref 38–126)
Anion gap: 8 (ref 5–15)
BUN: 17 mg/dL (ref 6–20)
CO2: 29 mmol/L (ref 22–32)
Calcium: 8.7 mg/dL — ABNORMAL LOW (ref 8.9–10.3)
Chloride: 104 mmol/L (ref 101–111)
Creatinine, Ser: 1.31 mg/dL — ABNORMAL HIGH (ref 0.44–1.00)
GFR calc Af Amer: 42 mL/min — ABNORMAL LOW (ref >60)
GFR calc non Af Amer: 36 mL/min — ABNORMAL LOW (ref >60)
Glucose, Bld: 169 mg/dL — ABNORMAL HIGH (ref 65–99)
Potassium: 4.3 mmol/L (ref 3.5–5.1)
Sodium: 141 mmol/L (ref 135–145)
Total Bilirubin: 0.9 mg/dL (ref 0.3–1.2)
Total Protein: 6.4 g/dL — ABNORMAL LOW (ref 6.5–8.1)

## 2017-03-31 LAB — TSH: TSH: 3.534 u[IU]/mL (ref 0.350–4.500)

## 2017-03-31 LAB — GLUCOSE, CAPILLARY
Glucose-Capillary: 93 mg/dL (ref 65–99)
Glucose-Capillary: 133 mg/dL — ABNORMAL HIGH (ref 65–99)
Glucose-Capillary: 147 mg/dL — ABNORMAL HIGH (ref 65–99)

## 2017-03-31 LAB — MAGNESIUM: Magnesium: 1.3 mg/dL — ABNORMAL LOW (ref 1.7–2.4)

## 2017-03-31 LAB — ECHOCARDIOGRAM COMPLETE
Height: 63 in
Weight: 2052.8 oz

## 2017-03-31 LAB — TROPONIN I: Troponin I: 0.06 ng/mL (ref <0.03)

## 2017-03-31 LAB — PHOSPHORUS: Phosphorus: 3.4 mg/dL (ref 2.5–4.6)

## 2017-03-31 MED ORDER — CYANOCOBALAMIN 1000 MCG/ML IJ SOLN: 1000.0000 ug | Freq: Every day | INTRAMUSCULAR | 0 refills | Status: AC

## 2017-03-31 MED ORDER — CYANOCOBALAMIN 1000 MCG/ML IJ SOLN
1000.0000 ug | Freq: Every day | INTRAMUSCULAR | Status: DC
  Administered 2017-03-31: 1000 ug via SUBCUTANEOUS
  Filled 2017-03-31: qty 1

## 2017-03-31 MED ORDER — VITAMIN B-12 1000 MCG PO TABS: 2000.0000 ug | ORAL_TABLET | Freq: Every day | ORAL | Status: DC

## 2017-03-31 MED ORDER — APIXABAN 2.5 MG PO TABS
2.5000 mg | ORAL_TABLET | Freq: Two times a day (BID) | ORAL | Status: DC
  Administered 2017-03-31: 2.5 mg via ORAL
  Filled 2017-03-31: qty 1

## 2017-03-31 MED ORDER — ATENOLOL 50 MG PO TABS
50.0000 mg | ORAL_TABLET | Freq: Every day | ORAL | Status: DC
  Administered 2017-03-31: 50 mg via ORAL
  Filled 2017-03-31: qty 1

## 2017-03-31 MED ORDER — APIXABAN 2.5 MG PO TABS: 2.5000 mg | ORAL_TABLET | Freq: Two times a day (BID) | ORAL | 0 refills | Status: DC

## 2017-03-31 MED ORDER — CYANOCOBALAMIN 2000 MCG PO TABS: 2000.0000 ug | ORAL_TABLET | Freq: Every day | ORAL | 0 refills | Status: DC

## 2017-03-31 MED ORDER — MAGNESIUM SULFATE 50 % IJ SOLN
3.0000 g | Freq: Once | INTRAVENOUS | Status: AC
  Administered 2017-03-31: 3 g via INTRAVENOUS
  Filled 2017-03-31: qty 6

## 2017-03-31 NOTE — Discharge Instructions (Signed)
Information on my medicine - ELIQUIS® (apixaban) ° °This medication education was reviewed with me or my healthcare representative as part of my discharge preparation.  The pharmacist that spoke with me during my hospital stay was:  Grabiela Wohlford R, RPH ° °Why was Eliquis® prescribed for you? °Eliquis® was prescribed for you to reduce the risk of a blood clot forming that can cause a stroke if you have a medical condition called atrial fibrillation (a type of irregular heartbeat). ° °What do You need to know about Eliquis® ? °Take your Eliquis® TWICE DAILY - one tablet in the morning and one tablet in the evening with or without food. If you have difficulty swallowing the tablet whole please discuss with your pharmacist how to take the medication safely. ° °Take Eliquis® exactly as prescribed by your doctor and DO NOT stop taking Eliquis® without talking to the doctor who prescribed the medication.  Stopping may increase your risk of developing a stroke.  Refill your prescription before you run out. ° °After discharge, you should have regular check-up appointments with your healthcare provider that is prescribing your Eliquis®.  In the future your dose may need to be changed if your kidney function or weight changes by a significant amount or as you get older. ° °What do you do if you miss a dose? °If you miss a dose, take it as soon as you remember on the same day and resume taking twice daily.  Do not take more than one dose of ELIQUIS at the same time to make up a missed dose. ° °Important Safety Information °A possible side effect of Eliquis® is bleeding. You should call your healthcare provider right away if you experience any of the following: °Bleeding from an injury or your nose that does not stop. °Unusual colored urine (red or dark brown) or unusual colored stools (red or black). °Unusual bruising for unknown reasons. °A serious fall or if you hit your head (even if there is no bleeding). ° °Some  medicines may interact with Eliquis® and might increase your risk of bleeding or clotting while on Eliquis®. To help avoid this, consult your healthcare provider or pharmacist prior to using any new prescription or non-prescription medications, including herbals, vitamins, non-steroidal anti-inflammatory drugs (NSAIDs) and supplements. ° °This website has more information on Eliquis® (apixaban): http://www.eliquis.com/eliquis/home  °

## 2017-03-31 NOTE — Consult Note (Addendum)
Cardiology Consultation:   Patient ID: Jamie Mueller; [id number]; 08-16-1930   Admit date: 03/30/2017 Date of Consult: 03/31/2017  Primary Care Provider: Renford Dills, MD Primary Cardiologist: New to [facility]; Dr. Antoine Poche Primary Electrophysiologist:  None   Patient Profile:   Jamie Mueller is a 84 y.o. female with a PMH HTN, HLD, DM type 2, ad PVD who is being seen today for the evaluation of atrial fibrillation at the request of Dr. Marland Mcalpine.  History of Present Illness:   Jamie Mueller was in her usual state of health until 3/13 when she noticed vision changes and a headache. She noted associated lightheadedness, pre-syncope, palpitations, nausea, and dry heaving. She denies chest pain or SOB during this episode. She states she just felt strange and decided to present to [facility] ED.   She states her symptoms have resolved and she is eager to go home. She is functional with ADLs and is able to walk to/from the mailbox unassisted without SOB or chest pain. She states she has never experienced chest pain. She does not recall feeling palpitations prior to 3/13. She denies recent illness, fever, DOE, orthopnea, PND, or LE edema. She has not had any falls and denies hematuria, hematochezia, and melena.  Hospital course: Afebrile, satting well on RA, Hypertensive to 200s/90s, with a stable HR. Labs notable for electrolytes wnl, Cr 1.36>1.31  (no recent comparison but Cr consistently >1 on prior labs), B12 144, Hgb 11.1, PLT 223, Trop 0.05>0.04>0.06. EKG with irregularly irregular rhythm, ?atrial fibrillation, no STE/D. CT Head/MRI brain without acute findings. MRI C/T spine limited by patient intolerance but revealed no obvious abnormalities. Patient was admitted to medicine for observation with neurology and cardiology consulting.   Past Medical History:  Diagnosis Date  . Diabetes mellitus without complication (HCC)   . Hypercholesteremia   . Hypertension    benign  . Memory  loss   . Vitamin B 12 deficiency     Past Surgical History:  Procedure Laterality Date  . ABDOMINAL HYSTERECTOMY    . CATARACT EXTRACTION Left 03/2015     Home Medications:  Prior to Admission medications   Medication Sig Start Date End Date Taking? Authorizing Provider  aspirin EC 81 MG tablet Take 81 mg by mouth daily.   Yes [provider]  atenolol (TENORMIN) 50 MG tablet TK 1 T PO QD 11/05/14  Yes [provider]  chlorthalidone (HYGROTON) 25 MG tablet TK 1 T PO QAM 12/15/14  Yes [provider]  enalapril (VASOTEC) 10 MG tablet TK 1 T PO QD 12/05/14  Yes [provider]  gabapentin (NEURONTIN) 300 MG capsule Take 300 mg by mouth 3 (three) times daily.   Yes [provider]  metFORMIN (GLUCOPHAGE) 500 MG tablet TK 2 TS PO  BID 10/16/14  Yes [provider]  pravastatin (PRAVACHOL) 40 MG tablet TK 1 T PO QD 11/16/14  Yes [provider]    Inpatient Medications: Scheduled Meds: . atenolol  25 mg Oral Daily  . enoxaparin (LOVENOX) injection  30 mg Subcutaneous Q24H  . gabapentin  300 mg Oral TID  . insulin aspart  0-5 Units Subcutaneous QHS  . insulin aspart  0-9 Units Subcutaneous TID WC  . pravastatin  20 mg Oral q1800   Continuous Infusions: . sodium chloride 100 mL/hr at 03/30/17 2036  . magnesium sulfate 1 - 4 g bolus IVPB     PRN Meds: hydrALAZINE  Allergies:   No Known Allergies  Social History:  Social History   Socioeconomic History  . Marital status: Married    Spouse name: Not on file  . Number of children: Not on file  . Years of education: Not on file  . Highest education level: Not on file  Social Needs  . Financial resource strain: Not on file  . Food insecurity - worry: Not on file  . Food insecurity - inability: Not on file  . Transportation needs - medical: Not on file  . Transportation needs - non-medical: Not on file  Occupational History  . Not on file  Tobacco Use  .  Smoking status: Never Smoker  . Smokeless tobacco: Never Used  Substance and Sexual Activity  . Alcohol use: No    Alcohol/week: 0.0 oz  . Drug use: No  . Sexual activity: Not on file  Other Topics Concern  . Not on file  Social History Narrative  . Not on file    Family History:   Denies family history of heart disease, MI, or CVA.  Family History  Problem Relation Age of Onset  . Cancer Father        lung     ROS:  Please see the history of present illness.   All other ROS reviewed and negative.     Physical Exam/Data:   Vitals:   03/30/17 1754 03/30/17 1826 03/30/17 2033 03/31/17 0318  BP: (!) 172/74 (!) 179/80 (!) 171/81 (!) 146/82  Pulse: 88 81 89 (!) 103  Resp: 18 18 16 16   Temp:  98.4 F (36.9 C) 99 F (37.2 C) 98.7 F (37.1 C)  TempSrc:  Oral Oral Oral  SpO2: 100% 100% 100% 100%  Weight:  128 lb 4.8 oz (58.2 kg)    Height:  5\' 3"  (1.6 m)      Intake/Output Summary (Last 24 hours) at 03/31/2017 1002 Last data filed at 03/31/2017 0600 Gross per 24 hour  Intake 1141.67 ml  Output -  Net 1141.67 ml   Filed Weights   03/30/17 0901 03/30/17 1826  Weight: 130 lb (59 kg) 128 lb 4.8 oz (58.2 kg)   Body mass index is 22.73 kg/m.  General:  Thin elderly female, well nourished, well developed, laying in bed in no acute distress HEENT: sclera anicteric  Neck: no JVD Vascular: No carotid bruits; distal pulses 2+ bilaterally Cardiac:  normal S1, S2; IRRR; no murmurs, gallops, or rubs Lungs:  clear to auscultation bilaterally, no wheezing, rhonchi or rales  Abd: NABS, soft, nontender, no hepatomegaly Ext: no edema Musculoskeletal:  No deformities, BUE and BLE strength normal and equal Skin: warm and dry  Neuro:  CNs 2-12 intact, no focal abnormalities noted Psych:  Normal affect   EKG:  The EKG was personally reviewed and demonstrates:  Irregularly irregular rhythm, rate 103, QTc 466, no STE/D Telemetry:  Telemetry was personally reviewed and demonstrates:   Unclear rhythm as there is evidence of p waves at times. ?wandering atrial pacemaker vs atrial fibrillation. Rate generally well controlled in the 80s-100s.   Relevant CV Studies:  Echocardiogram 03/31/17: Study Conclusions  - Left ventricle: The cavity size was normal. There was moderate   concentric hypertrophy. Systolic function was normal. The   estimated ejection fraction was in the range of 60% to 65%. Wall   motion was normal; there were no regional wall motion   abnormalities. - Aortic valve: Trileaflet; normal thickness, mildly calcified   leaflets. - Mitral valve: There was mild regurgitation. - Left atrium: The atrium was  mildly dilated. - Pulmonary arteries: Systolic pressure was mildly increased. PA   peak pressure: 40 mm Hg (S). - Pericardium, extracardiac: A small pericardial effusion was   identified.  Laboratory Data:  Chemistry Recent Labs  Lab 03/30/17 1016 03/31/17 0831  NA 142 141  K 4.3 4.3  CL 105 104  CO2 28 29  GLUCOSE 153* 169*  BUN 16 17  CREATININE 1.36* 1.31*  CALCIUM 8.9 8.7*  GFRNONAA 35* 36*  GFRAA 40* 42*  ANIONGAP 9 8    Recent Labs  Lab 03/31/17 0831  PROT 6.4*  ALBUMIN 3.2*  AST 23  ALT 11*  ALKPHOS 55  BILITOT 0.9   Hematology Recent Labs  Lab 03/30/17 1016 03/31/17 0831  WBC 5.7 5.9  RBC 4.34 4.15  HGB 11.5* 11.1*  HCT 35.9* 34.1*  MCV 82.7 82.2  MCH 26.5 26.7  MCHC 32.0 32.6  RDW 14.1 14.1  PLT 241 223   Cardiac Enzymes Recent Labs  Lab 03/30/17 1712 03/30/17 2036 03/31/17 0153  TROPONINI 0.05* 0.04* 0.06*    Recent Labs  Lab 03/30/17 1219  TROPIPOC 0.02    BNPNo results for input(s): BNP, PROBNP in the last 168 hours.  DDimer No results for input(s): DDIMER in the last 168 hours.  Radiology/Studies:  Ct Head Wo Contrast  Result Date: 03/30/2017 CLINICAL DATA:  Dizziness EXAM: CT HEAD WITHOUT CONTRAST TECHNIQUE: Contiguous axial images were obtained from the base of the skull through the vertex  without intravenous contrast. COMPARISON:  04/05/2006 FINDINGS: Brain: There is no evidence for acute hemorrhage, hydrocephalus, mass lesion, or abnormal extra-axial fluid collection. No definite CT evidence for acute infarction. Diffuse loss of parenchymal volume is consistent with atrophy. Patchy low attenuation in the deep hemispheric and periventricular white matter is nonspecific, but likely reflects chronic microvascular ischemic demyelination. Vascular: No hyperdense vessel or unexpected calcification. Skull: Normal. Negative for fracture or focal lesion. Sinuses/Orbits: No acute finding. Other: None. IMPRESSION: No acute intracranial abnormality. Atrophy with chronic small vessel white matter ischemic disease. Electronically Signed   By: Kennith Center M.D.   On: 03/30/2017 12:12   Mr Brain Wo Contrast  Result Date: 03/30/2017 CLINICAL DATA:  Initial evaluation for acute headache, new onset dizziness. EXAM: MRI HEAD WITHOUT CONTRAST MRI CERVICAL SPINE WITHOUT CONTRAST TECHNIQUE: Multiplanar, multiecho pulse sequences of the brain and surrounding structures, and cervical spine, to include the craniocervical junction and cervicothoracic junction, were obtained without intravenous contrast. COMPARISON:  Prior CT from earlier the same day. FINDINGS: MRI HEAD FINDINGS Brain: Diffuse prominence of the CSF containing spaces compatible with generalized cerebral atrophy. Patchy T2/FLAIR hyperintensity within the periventricular and deep white matter both cerebral hemispheres noted, nonspecific, but most likely related chronic small vessel ischemic changes, felt to be within normal limits for age. Probable tiny remote lacunar infarct noted within the left thalamus. No abnormal foci of restricted diffusion to suggest acute or subacute ischemia. Gray-white matter differentiation maintained. No other evidence for chronic infarction. No acute or chronic intracranial hemorrhage. No mass lesion, midline shift or mass  effect. Mild diffuse ventricular prominence related global parenchymal volume loss of hydrocephalus. No extra-axial fluid collection. Major dural sinuses are patent. Pituitary gland suprasellar region normal. Midline structures intact and normal. Vascular: Major intracranial vascular flow voids are maintained. Probable mild atherosclerotic change noted within the left V4 segment. Skull and upper cervical spine: Craniocervical junction normal. Bone marrow signal intensity within normal limits. No scalp soft tissue abnormality. Sinuses/Orbits: Globes normal soft tissues within normal  limits. Patient status post cataract extraction bilaterally. Paranasal sinuses are clear. No mastoid effusion. Inner ear structures normal. Other: None. MRI CERVICAL SPINE FINDINGS Alignment: Examination markedly limited as the patient was unable to complete the entirety of the exam. Only sagittal T1 weighted image was performed. Straightening with slight reversal of the normal cervical lordosis, apex at C5-6. Trace anterolisthesis of C4 on C5 due to chronic facet degeneration. Trace retrolisthesis of C6 on C7. No other listhesis or malalignment. Vertebrae: Vertebral body heights are maintained without acute or chronic fracture. Underlying bone marrow signal intensity within normal limits. There is a well-circumscribed ovoid 12 mm lesion within the anterior aspect of the T4 vertebral body (series 2, image 5), indeterminate on this limited exam. No other discrete osseous lesions. Cord: Cervical spinal cord grossly within normal limits, although incompletely evaluated on this limited exam. Cord caliber within normal limits. Posterior Fossa, vertebral arteries, paraspinal tissues: Craniocervical junction within normal limits. Visualized paraspinous soft tissues within normal limits. No acute abnormality on this limited exam. Disc levels: Mild degenerative spondylolysis noted at C5-6 and C6-7 without significant spinal stenosis. Multilevel  facet hypertrophy noted throughout the cervical spine, incompletely evaluated on this exam. Degenerative changes identified on this limited exam. No severe canal stenosis or evidence for cord compression. Evaluation for foraminal narrowing limited on this exam. IMPRESSION: MRI HEAD IMPRESSION: 1. No acute intracranial abnormality. 2. Age appropriate cerebral atrophy with mild chronic small vessel ischemic disease. MRI CERVICAL SPINE IMPRESSION: 1. Markedly limited study due to the patient's inability to tolerate the full length of the exam. A sagittal T1 weighted sequence of the cervical spine only was performed. Additionally, upon review of ectatic, MRI of the thoracic spine was ordered, but not performed. 2. No obvious acute abnormality identified on this limited exam. 3. Mild degenerative spondylolysis at C5-6 and C6-7 without significant spinal stenosis. 4. 12 mm T1 hypointense lesion within the T4 vertebral body, incompletely evaluated on this limited exam. Attention at follow-up recommended. Electronically Signed   By: Rise Mu M.D.   On: 03/30/2017 20:24   Mr Cervical Spine Wo Contrast  Result Date: 03/30/2017 CLINICAL DATA:  Initial evaluation for acute headache, new onset dizziness. EXAM: MRI HEAD WITHOUT CONTRAST MRI CERVICAL SPINE WITHOUT CONTRAST TECHNIQUE: Multiplanar, multiecho pulse sequences of the brain and surrounding structures, and cervical spine, to include the craniocervical junction and cervicothoracic junction, were obtained without intravenous contrast. COMPARISON:  Prior CT from earlier the same day. FINDINGS: MRI HEAD FINDINGS Brain: Diffuse prominence of the CSF containing spaces compatible with generalized cerebral atrophy. Patchy T2/FLAIR hyperintensity within the periventricular and deep white matter both cerebral hemispheres noted, nonspecific, but most likely related chronic small vessel ischemic changes, felt to be within normal limits for age. Probable tiny remote  lacunar infarct noted within the left thalamus. No abnormal foci of restricted diffusion to suggest acute or subacute ischemia. Gray-white matter differentiation maintained. No other evidence for chronic infarction. No acute or chronic intracranial hemorrhage. No mass lesion, midline shift or mass effect. Mild diffuse ventricular prominence related global parenchymal volume loss of hydrocephalus. No extra-axial fluid collection. Major dural sinuses are patent. Pituitary gland suprasellar region normal. Midline structures intact and normal. Vascular: Major intracranial vascular flow voids are maintained. Probable mild atherosclerotic change noted within the left V4 segment. Skull and upper cervical spine: Craniocervical junction normal. Bone marrow signal intensity within normal limits. No scalp soft tissue abnormality. Sinuses/Orbits: Globes normal soft tissues within normal limits. Patient status post cataract extraction bilaterally.  Paranasal sinuses are clear. No mastoid effusion. Inner ear structures normal. Other: None. MRI CERVICAL SPINE FINDINGS Alignment: Examination markedly limited as the patient was unable to complete the entirety of the exam. Only sagittal T1 weighted image was performed. Straightening with slight reversal of the normal cervical lordosis, apex at C5-6. Trace anterolisthesis of C4 on C5 due to chronic facet degeneration. Trace retrolisthesis of C6 on C7. No other listhesis or malalignment. Vertebrae: Vertebral body heights are maintained without acute or chronic fracture. Underlying bone marrow signal intensity within normal limits. There is a well-circumscribed ovoid 12 mm lesion within the anterior aspect of the T4 vertebral body (series 2, image 5), indeterminate on this limited exam. No other discrete osseous lesions. Cord: Cervical spinal cord grossly within normal limits, although incompletely evaluated on this limited exam. Cord caliber within normal limits. Posterior Fossa,  vertebral arteries, paraspinal tissues: Craniocervical junction within normal limits. Visualized paraspinous soft tissues within normal limits. No acute abnormality on this limited exam. Disc levels: Mild degenerative spondylolysis noted at C5-6 and C6-7 without significant spinal stenosis. Multilevel facet hypertrophy noted throughout the cervical spine, incompletely evaluated on this exam. Degenerative changes identified on this limited exam. No severe canal stenosis or evidence for cord compression. Evaluation for foraminal narrowing limited on this exam. IMPRESSION: MRI HEAD IMPRESSION: 1. No acute intracranial abnormality. 2. Age appropriate cerebral atrophy with mild chronic small vessel ischemic disease. MRI CERVICAL SPINE IMPRESSION: 1. Markedly limited study due to the patient's inability to tolerate the full length of the exam. A sagittal T1 weighted sequence of the cervical spine only was performed. Additionally, upon review of ectatic, MRI of the thoracic spine was ordered, but not performed. 2. No obvious acute abnormality identified on this limited exam. 3. Mild degenerative spondylolysis at C5-6 and C6-7 without significant spinal stenosis. 4. 12 mm T1 hypointense lesion within the T4 vertebral body, incompletely evaluated on this limited exam. Attention at follow-up recommended. Electronically Signed   By: Rise Mu M.D.   On: 03/30/2017 20:24   Mr Thoracic Spine Wo Contrast  Result Date: 03/30/2017 CLINICAL DATA:  Initial evaluation for acute headache, new onset dizziness. EXAM: MRI HEAD WITHOUT CONTRAST MRI CERVICAL SPINE WITHOUT CONTRAST TECHNIQUE: Multiplanar, multiecho pulse sequences of the brain and surrounding structures, and cervical spine, to include the craniocervical junction and cervicothoracic junction, were obtained without intravenous contrast. COMPARISON:  Prior CT from earlier the same day. FINDINGS: MRI HEAD FINDINGS Brain: Diffuse prominence of the CSF containing  spaces compatible with generalized cerebral atrophy. Patchy T2/FLAIR hyperintensity within the periventricular and deep white matter both cerebral hemispheres noted, nonspecific, but most likely related chronic small vessel ischemic changes, felt to be within normal limits for age. Probable tiny remote lacunar infarct noted within the left thalamus. No abnormal foci of restricted diffusion to suggest acute or subacute ischemia. Gray-white matter differentiation maintained. No other evidence for chronic infarction. No acute or chronic intracranial hemorrhage. No mass lesion, midline shift or mass effect. Mild diffuse ventricular prominence related global parenchymal volume loss of hydrocephalus. No extra-axial fluid collection. Major dural sinuses are patent. Pituitary gland suprasellar region normal. Midline structures intact and normal. Vascular: Major intracranial vascular flow voids are maintained. Probable mild atherosclerotic change noted within the left V4 segment. Skull and upper cervical spine: Craniocervical junction normal. Bone marrow signal intensity within normal limits. No scalp soft tissue abnormality. Sinuses/Orbits: Globes normal soft tissues within normal limits. Patient status post cataract extraction bilaterally. Paranasal sinuses are clear. No mastoid effusion.  Inner ear structures normal. Other: None. MRI CERVICAL SPINE FINDINGS Alignment: Examination markedly limited as the patient was unable to complete the entirety of the exam. Only sagittal T1 weighted image was performed. Straightening with slight reversal of the normal cervical lordosis, apex at C5-6. Trace anterolisthesis of C4 on C5 due to chronic facet degeneration. Trace retrolisthesis of C6 on C7. No other listhesis or malalignment. Vertebrae: Vertebral body heights are maintained without acute or chronic fracture. Underlying bone marrow signal intensity within normal limits. There is a well-circumscribed ovoid 12 mm lesion within  the anterior aspect of the T4 vertebral body (series 2, image 5), indeterminate on this limited exam. No other discrete osseous lesions. Cord: Cervical spinal cord grossly within normal limits, although incompletely evaluated on this limited exam. Cord caliber within normal limits. Posterior Fossa, vertebral arteries, paraspinal tissues: Craniocervical junction within normal limits. Visualized paraspinous soft tissues within normal limits. No acute abnormality on this limited exam. Disc levels: Mild degenerative spondylolysis noted at C5-6 and C6-7 without significant spinal stenosis. Multilevel facet hypertrophy noted throughout the cervical spine, incompletely evaluated on this exam. Degenerative changes identified on this limited exam. No severe canal stenosis or evidence for cord compression. Evaluation for foraminal narrowing limited on this exam. IMPRESSION: MRI HEAD IMPRESSION: 1. No acute intracranial abnormality. 2. Age appropriate cerebral atrophy with mild chronic small vessel ischemic disease. MRI CERVICAL SPINE IMPRESSION: 1. Markedly limited study due to the patient's inability to tolerate the full length of the exam. A sagittal T1 weighted sequence of the cervical spine only was performed. Additionally, upon review of ectatic, MRI of the thoracic spine was ordered, but not performed. 2. No obvious acute abnormality identified on this limited exam. 3. Mild degenerative spondylolysis at C5-6 and C6-7 without significant spinal stenosis. 4. 12 mm T1 hypointense lesion within the T4 vertebral body, incompletely evaluated on this limited exam. Attention at follow-up recommended. Electronically Signed   By: Rise MuBenjamin  McClintock M.D.   On: 03/30/2017 20:24    Assessment and Plan:   1. New onset paroxysmal atrial fibrillation: possible patient presented with symptoms corresponding with new onset atrial fibrillation. Rate has been reasonably controlled on telemetry. Patient is on atenolol for BP  control. - This patients CHA2DS2-VASc Score and unadjusted Ischemic Stroke Rate (% per year) is equal to at least 7.2 % stroke rate/year from a score of 5 Above score calculated as 1 point each if present [CHF, HTN, DM, Vascular=MI/PAD/Aortic Plaque, Age if 65-74, or Female] Above score calculated as 2 points each if present [Age > 75, or Stroke/TIA/TE] - Would benefit from anticoagulation going forward. Can consider Eliquis BID at discharge - Will restart home dose of 50mg  atenolol daily for better HR and BP control - Will add on TSH to today's labs  2. Elevated Troponin: Patient denies CP. BP on arrival 200s/90s. Trop 0.05>0.04>0.06. EKG with atrial fibrillation, non-ischemic. Echocardiogram pending. Atypical symptoms for ACS. Mildly elevated troponin could be contributed to by hypertensive urgency and atrial fibrillation. - Echocardiogram today with EF 60-65% and no wall motion abnormalities.  - No further ischemic work-up at this time   3. Dizziness/pre-syncope: Evaluated by neurology. CT head negative for acute findings. MRI brain without acute findings. MRI C/T spine incomplete due to patient tolerance but no obvious acute abnormalities identified. Pre-syncope attributed to by orthostatic hypotension/ volume depletion. Hyperactive reflexes attributed to by B12 deficiency. Recommended to start B12 repletion and to repeat MRI C/T spine outpatient.  - Continue management per primary team/neurology  4. Hypertensive urgency: BP improved this AM, although still hypertensive - Will increase atenolol back to home dose of 50mg  daily  5. DM type 2: Hgb A1C 6.0 - at goal of <7 - Continue management per primary team  6. HLD: no recent lipid profile - Continue statin  7. Suspect CKD stage 3: Cr stable at 1.31 today. Cr consisently >1.0 on previous check, although nothing recent) - s/p IVF - Continue to monitor  Will arrange follow-up with cardiology in 1-2 weeks.    For questions or updates,  please contact [facility] Please consult [email or web address] for contact info under Cardiology/STEMI.   Signed, Beatriz Stallion, PA-C  03/31/2017 10:02 AM [phone]  History and all data above reviewed.  Patient examined.  I agree with the findings as above.  The patient wants to go home.  No acute complaints.  She does not really recall the symptoms that she had when she presented.  There is a mention of vision changes and headache.  She reports none of this now.  No acute findings on exam or neuro imaging.  However, she has a minimally abnormal troponin and atrial fib noted on EKG.   The patient exam reveals COR:RRR  ,  Lungs: Clear  ,  Abd: Positive bowel sounds, no rebound no guarding, Ext No edema, reduced DP/PT bilateral lower  .  All available labs, radiology testing, previous records reviewed. Agree with documented assessment and plan. TIA like symptoms:  She does have MAT .  She also has PAF.  I had a long discussion with her and her family and I explained the risk of stroke and bleeding with and without DOAC.  I hope she will make the decision to start Eliquis as above.  No ASA if she starts Eliquis.  She needs to have better BP control and I talked to her son about getting a BP monitor and how to keep a diary.   Fayrene Fearing Maxmillian Carsey  1:42 PM  03/31/2017

## 2017-03-31 NOTE — Progress Notes (Signed)
Occupational Therapy Treatment Patient Details Name: Jamie Mueller MRN: [id number] DOB: [birth year] Today's Date: 03/31/2017    History of present illness Jamie Mueller is a 84 y.o. female with medical history significant of hypertension, hyperlipidemia, diabetes mellitus, presents 03/30/17 with chest discomfort associated with  Nausea, vomiting( dry heaving) , dizziness, presyncopal , palpitations and  headache. Neurology workup negative.    OT comments  Pt seen for initial evaluation. She is mostly at a supervision to min guard level. She will have 24/7 at home. No further OT is needed.  Follow Up Recommendations  No OT follow up;Supervision/Assistance - 24 hour    Equipment Recommendations  (family will look into shower chair or grab bar)    Recommendations for Other Services      Precautions / Restrictions Precautions Precautions: Fall Restrictions Weight Bearing Restrictions: No       Mobility Bed Mobility               General bed mobility comments: up with family  Transfers Overall transfer level: Needs assistance Equipment used: None Transfers: Sit to/from Stand Sit to Stand: Supervision              Balance           Standing balance support: During functional activity;No upper extremity supported Standing balance-Leahy Scale: Fair Standing balance comment: stands  a nd turns for gown to be placed                           ADL either performed or assessed with clinical judgement   ADL Overall ADL's : Needs assistance/impaired Eating/Feeding: Independent   Grooming: Supervision/safety;Standing   Upper Body Bathing: Set up;Sitting   Lower Body Bathing: Supervison/ safety;Sit to/from stand   Upper Body Dressing : Set up;Sitting   Lower Body Dressing: Supervision/safety;Sit to/from stand   Toilet Transfer: Min guard;Ambulation;Comfort height toilet   Toileting- Clothing Manipulation and Hygiene:  Supervision/safety;Sit to/from stand         General ADL Comments: min guard for safety when ambulating. Pt tends to move quickly     Vision       Perception     Praxis      Cognition Arousal/Alertness: Awake/alert Behavior During Therapy: WFL for tasks assessed/performed Overall Cognitive Status: Impaired/Different from baseline Area of Impairment: Orientation                 Orientation Level: Disoriented to;Place             General Comments: family reports patient is exhibiting some comfusion        Exercises     Shoulder Instructions       General Comments      Pertinent Vitals/ Pain       Pain Assessment: No/denies pain  Home Living Family/patient expects to be discharged to:: Private residence Living Arrangements: Children Available Help at Discharge: Family Type of Home: House       Home Layout: One level     Bathroom Shower/Tub: Chief Strategy Officer: Standard     Home Equipment: None   Additional Comments: lives with son      Prior Functioning/Environment Level of Independence: Independent            Frequency           Progress Toward Goals  OT Goals(current goals can now be found in the care plan section)  Acute Rehab OT Goals Patient Stated Goal: go home OT Goal Formulation: All assessment and education complete, DC therapy  Plan      Co-evaluation                 AM-PAC PT "6 Clicks" Daily Activity     Outcome Measure   Help from another person eating meals?: None Help from another person taking care of personal grooming?: A Little Help from another person toileting, which includes using toliet, bedpan, or urinal?: A Little Help from another person bathing (including washing, rinsing, drying)?: A Little Help from another person to put on and taking off regular upper body clothing?: A Little Help from another person to put on and taking off regular lower body clothing?: A  Little 6 Click Score: 19    End of Session    OT Visit Diagnosis: Muscle weakness (generalized) (M62.81)   Activity Tolerance Patient tolerated treatment well   Patient Left in chair;with call bell/phone within reach;with chair alarm set   Nurse Communication          Time: [id number] OT Time Calculation (min): 10 min  Charges: OT General Charges $OT Visit: 1 Visit OT Evaluation $OT Eval Low Complexity: 1 Low  Jamie OtterMaryellen Neoma Mueller, OTR/L [phone] 03/31/2017   Jamie Mueller 03/31/2017, 3:42 PM

## 2017-03-31 NOTE — Progress Notes (Signed)
  Echocardiogram 2D Echocardiogram has been performed.  Leta JunglingCooper, Diamone Whistler M 03/31/2017, 9:37 AM

## 2017-03-31 NOTE — Evaluation (Signed)
Physical Therapy Evaluation Patient Details Name: Jamie Mueller MRN: [id number] DOB: 08-21-1930 Today's Date: 03/31/2017   History of Present Illness  Jamie Mueller is a 84 y.o. female with medical history significant of hypertension, hyperlipidemia, diabetes mellitus, presents 03/30/17 with chest discomfort associated with  Nausea, vomiting( dry heaving) , dizziness, presyncopal , palpitations and  headache. Neurology workup negative.   Clinical Impression  The patient  Is mobilizing with min guard to supervision. Family reports mild confusion. Patient lives with son. Plans Dc today.  PT will sign off as patient is to DC per MD.   Follow Up Recommendations No PT follow up    Equipment Recommendations  None recommended by PT    Recommendations for Other Services       Precautions / Restrictions Precautions Precautions: Fall      Mobility  Bed Mobility               General bed mobility comments: up with family  Transfers Overall transfer level: Needs assistance Equipment used: None Transfers: Sit to/from Stand Sit to Stand: Supervision            Ambulation/Gait Ambulation/Gait assistance: Supervision;Min guard Ambulation Distance (Feet): 100 Feet Assistive device: None Gait Pattern/deviations: Step-through pattern     General Gait Details: patient ambulates fast at times, cues to slow down  Stairs            Wheelchair Mobility    Modified Rankin (Stroke Patients Only)       Balance           Standing balance support: During functional activity;No upper extremity supported Standing balance-Leahy Scale: Fair Standing balance comment: stands  a nd turns for gown to be placed                             Pertinent Vitals/Pain Pain Assessment: No/denies pain    Home Living Family/patient expects to be discharged to:: Private residence Living Arrangements: Children Available Help at Discharge: Family Type of Home:  House       Home Layout: One level Home Equipment: None Additional Comments: lives with son    Prior Function Level of Independence: Independent               Hand Dominance        Extremity/Trunk Assessment        Lower Extremity Assessment Lower Extremity Assessment: Overall WFL for tasks assessed    Cervical / Trunk Assessment Cervical / Trunk Assessment: Normal  Communication   Communication: No difficulties  Cognition Arousal/Alertness: Awake/alert Behavior During Therapy: WFL for tasks assessed/performed Overall Cognitive Status: Impaired/Different from baseline Area of Impairment: Orientation                 Orientation Level: Disoriented to;Place             General Comments: family reports patient is exhibiting some comfusion      General Comments      Exercises     Assessment/Plan    PT Assessment Patent does not need any further PT services  PT Problem List         PT Treatment Interventions      PT Goals (Current goals can be found in the Care Plan section)  Acute Rehab PT Goals Patient Stated Goal: go home PT Goal Formulation: All assessment and education complete, DC therapy    Frequency     Barriers  to discharge        Co-evaluation               AM-PAC PT "6 Clicks" Daily Activity  Outcome Measure Difficulty turning over in bed (including adjusting bedclothes, sheets and blankets)?: A Little Difficulty moving from lying on back to sitting on the side of the bed? : A Little Difficulty sitting down on and standing up from a chair with arms (e.g., wheelchair, bedside commode, etc,.)?: A Little Help needed moving to and from a bed to chair (including a wheelchair)?: A Little Help needed walking in hospital room?: A Little Help needed climbing 3-5 steps with a railing? : A Little 6 Click Score: 18    End of Session Equipment Utilized During Treatment: Gait belt Activity Tolerance: Patient tolerated  treatment well Patient left: in chair;with call bell/phone within reach;with chair alarm set;with family/visitor present Nurse Communication: Mobility status PT Visit Diagnosis: Unsteadiness on feet (R26.81)    Time: 1610-9604 PT Time Calculation (min) (ACUTE ONLY): 20 min   Charges:   PT Evaluation $PT Eval Low Complexity: 1 Low     PT G CodesBlanchard Kelch PT [phone]   Rada Hay 03/31/2017, 3:09 PM

## 2017-03-31 NOTE — Progress Notes (Signed)
ANTICOAGULATION CONSULT NOTE - Initial Consult  Pharmacy Consult for Apixaban Indication: atrial fibrillation  No Known Allergies  Patient Measurements: Height: 5\' 3"  (160 cm) Weight: 128 lb 4.8 oz (58.2 kg) IBW/kg (Calculated) : 52.4  Vital Signs:    Labs: Recent Labs    03/30/17 1016 03/30/17 1712 03/30/17 2036 03/31/17 0153 03/31/17 0831  HGB 11.5*  --   --   --  11.1*  HCT 35.9*  --   --   --  34.1*  PLT 241  --   --   --  223  APTT 28  --   --   --   --   LABPROT 12.7  --   --   --   --   INR 0.96  --   --   --   --   CREATININE 1.36*  --   --   --  1.31*  TROPONINI  --  0.05* 0.04* 0.06*  --     Estimated Creatinine Clearance: 26.4 mL/min (A) (by C-G formula based on SCr of 1.31 mg/dL (H)).   Medical History: Past Medical History:  Diagnosis Date  . Diabetes mellitus without complication (HCC)   . Hypercholesteremia   . Hypertension    benign  . Memory loss   . Vitamin B 12 deficiency     Medications:  Scheduled:  . atenolol  50 mg Oral Daily  . cyanocobalamin  1,000 mcg Subcutaneous Daily  . enoxaparin (LOVENOX) injection  30 mg Subcutaneous Q24H  . gabapentin  300 mg Oral TID  . insulin aspart  0-5 Units Subcutaneous QHS  . insulin aspart  0-9 Units Subcutaneous TID WC  . pravastatin  20 mg Oral q1800  . [START ON 04/07/2017] vitamin B-12  2,000 mcg Oral Daily   Infusions:  . sodium chloride 100 mL/hr at 03/30/17 2036   PRN: hydrALAZINE  Assessment: 84 yo female with new-onset afib, CHA2DS2-VASc Score = 5.  Pharmacy consulted to dose apixaban per Cardiology recommendations.  Age > 80  Weight < 60kg  SCr 1.31  CrCl 29 ml/min using total body weight  No drug interactions noted  Goal of Therapy:  Prevention of VTE/stroke   Plan:  Apixaban 2.5mg  PO bid Provide education prior to discharge Monitor for signs/symptoms of bleeding  Loralee PacasErin Orra Nolde, PharmD, BCPS Pager: [phone] 03/31/2017,3:42 PM

## 2017-03-31 NOTE — Progress Notes (Signed)
Pt reading new onset of Afib on the monitor. EKG and vitals obtained and NP notified. No new orders at this time. Pt is asymptomatic at this time. Will continue to monitor closely.

## 2017-03-31 NOTE — Progress Notes (Signed)
Follow up review of imaging studies:  MRI head reveals no acute intracranial abnormality. There is age appropriate cerebral atrophy with mild chronic small vessel ischemic disease.  MRI cervical spine was a markedly limited study due to the patient's inability to tolerate the full length of the exam. A sagittal T1 weighted sequence of the cervical spine only was performed.  2. No obvious acute abnormality identified on this limited exam. 3. Mild degenerative spondylolysis at C5-6 and C6-7 without significant spinal stenosis. 4. 12 mm T1 hypointense lesion within the T4 vertebral body, incompletely evaluated on this limited exam. Attention at follow-up Recommended.  Due to patient's inability to tolerate continuation of the imaging study, MRI of the thoracic spine could not be performed.  Vitamin B12 level came back abnormally low at 144. This may explain her lower extremity hyperreflexia on exam, secondary to possible B12 myelopathy.  Assessment: 84 year old female with multiple neurological symptoms. No single CNS or PNS localization can be inferred from the separate symptoms and findings. B12 came back abnormally low at 144. 1. Presyncope: May be due to orthostatic hypotension or volume depletion 2. Foot pain: Most likely due to arthritis 3. Hyperactive patellar reflexes. DDx includes an old cord lesion or diffuse myelopathy such as secondary to her B12 deficiency 4. Severe headache at time of admission. CT head was unremarkable. 5. MRI cervical spine was limited due to patient's inability to tolerate the study. No cord compression seen.  6. MRI brain without acute abnormality.  7. Due to patient's inability to tolerate continuation of the imaging study, MRI of the thoracic spine could not be performed.  Recommendations: 1. Repeat attempt at MRI cervical and thoracic spine to obtain a full study, as inpatient or outpatient. May require conscious sedation or an open MRI - the latter can  only be obtained as an outpatient.  2. Start B12 supplementation at 1000 mcg subcutaneously qd x 1 week, then switch to 2000 mcg po qd thereafter. Will need repeat B12 level in 4 weeks.  3. PT assessment 4. Gentle IV hydration in addition to encouraging PO hydration 5. Orthostatics 6. Continue ASA and pravachol 7. Neurology will sign off. Please call if there are additional questions.   Electronically signed: Dr. Caryl PinaEric Avyn Coate

## 2017-03-31 NOTE — Progress Notes (Signed)
Bilateral carotid duplex completed. There is a 1% to 39% ICA stenosis. Acoustic shadowing on the left may obscure higher velocities . Vertebral artery flow is antegrade bilaterally. [employer] ElectricVirginia Neno [place], RVS 03/31/2017 12:53 PM

## 2017-04-01 ENCOUNTER — Encounter: Payer: Self-pay | Admitting: Cardiology

## 2017-04-05 ENCOUNTER — Telehealth: Payer: Self-pay

## 2017-04-05 NOTE — Telephone Encounter (Signed)
[facility] attempted to forward to [facility]

## 2017-04-06 NOTE — Discharge Summary (Signed)
Physician Discharge Summary  Jamie Mueller:[id number] DOB: Oct 06, 1930 DOA: 03/30/2017  PCP: Renford Dills, MD  Admit date: 03/30/2017 Discharge date: 04/06/2017  Admitted From: Home Disposition: Home  Recommendations for Outpatient Follow-up:  1. Follow up with PCP in 1-2 weeks 2. Follow up with Cardiology in 1-2 weeks 3. Follow up with Neurology in 1-2 weeks 4. Repeat MRI as an outpatient  5. Please obtain CMP/CBC, Mag, Phos in one week 6. Please follow up on the following pending results:  Home Health: No Equipment/Devices: None recommended by PT  Discharge Condition: Stable CODE STATUS: FULL CODE Diet recommendation: Heart Healthy Diet  Brief/Interim Summary: Jamie Mueller is a 84 y.o. female with medical history significant of hypertension, hyperlipidemia, diabetes mellitus, presents today with some chest discomfort at home, at rest, resolved spontaneously, associated with  Nausea, vomiting( dry heaving) , dizziness, presyncopal , palpitations and  headache. No syncope at home or in the past. No sob, or cough, fever or chills. No abd pain, diarrhea, or urinary symptoms. No sensory deficits or weakness as per the patient. Pt reports all her symptoms improved on arrival to ED. She was referred to medical service for admission for evaluation of the above. Neurology and Cardiology consulted by EDP. She went into new Onset A Fib and was not able to tolerate her MRI's and refused subsequent ones. Cardiology evaluated and recommended Eliquis. She had multiple Neurological complaints and Neurology made their recommendations. She improved and was evaluated by PT and recommended no follow up. She was deemed medically stable to D/C home and will need to follow up with Cardiology, Neurology, and PCP. She will also need repeat MRI C and T Spine as an outpatient.  Discharge Diagnoses:  Active Problems:   Chest discomfort   Essential hypertension   Type 2 diabetes mellitus without  complication (HCC)   Hyperlipidemia  Chest discomfort associated with nausea, vomiting, palpitations and dizziness and mildly elevated Troponin -Evaluated for ACS,  -Admit to telemetry, serial troponins, echocardiogram.  -Troponin went from 0.05 -> 0.04 -> 0.06  -Cardiology consulted by EDP and ECHO done and EF was 60-65%; Cardiology recommending no further ischemic workup at this time  New Onset Atrial Fibrillation  -CHADS2-VASc of at least 5 -C/w Atenolol -TSH normal -Started Eliquis and stopped ASA -Follow up with Cardiology as an outpatient   DIZZINESS, pre syncope, improved -Evaluated for TIA.  -TSH was 3.534 and UDS and UA Negative -CT unremarkable and MRI brain unremakabl  -Further recommendations as per neurology below  Multiple Neurological Symptoms -MRI Brain Normal and MRI C Spine limited and unable to do MRI T Spine -Repeat MRI C and T Spine as an outpatient as patient refused; Has hyperpatellar Reflexes -Carotid Duplex showed There is a 1% to 39% ICA stenosis. Acoustic shadowing on the left may obscure higher velocities Vertebral artery flow is antegrade bilaterally -Neurolopy recommending Vitamin B12 Supplementation -PT with no Follow up recommended -Given IV hydration -Orthostatics Done and Normal -Neurology Recommending ASA and Pravachol but will not give ASA as she is going to be Anticoagulated.   Hyperlipidemia:  -Lipid Panel as An outpaient   Hypomagnesemia -Replete prior to D/C -Follow up as an outpateint   Type 2 DM:  -HbA1c was 6.0 -C/w Home Meds  Hypertensive Urgency:  -Resume Home Meds at D/C and restarted Atenolol 50 mg po Daily  -Cardiology recommending better BP Control  Acute renal failure But likely CKD Stage 3 -Previous baseline close to 1 but that was several years  ago -On admissionits 1.36 , could be pre renal, dehydration, would explain dizziness -IVF D/C'd -Did not change much after IVF Rehydration and likley CKD -Follow up  as an outpatient  Discharge Instructions  Discharge Instructions    Ambulatory referral to Neurology   Complete by:  As directed    An appointment is requested in approximately: 1-2 weeks. Needs MRA of Brain and Repeat MRI of Cervical and Thoracic Spine as she refused it to be repeated.   Call MD for:  difficulty breathing, headache or visual disturbances   Complete by:  As directed    Call MD for:  extreme fatigue   Complete by:  As directed    Call MD for:  hives   Complete by:  As directed    Call MD for:  persistant dizziness or light-headedness   Complete by:  As directed    Call MD for:  persistant nausea and vomiting   Complete by:  As directed    Call MD for:  redness, tenderness, or signs of infection (pain, swelling, redness, odor or green/yellow discharge around incision site)   Complete by:  As directed    Call MD for:  severe uncontrolled pain   Complete by:  As directed    Call MD for:  temperature >100.4   Complete by:  As directed    Diet - low sodium heart healthy   Complete by:  As directed    Discharge instructions   Complete by:  As directed    Follow up with PCP, Cardiology, and Neurology at Discharge. Take all medications as prescribed. If symptoms change or worsen please return to the ED for evaluation.   Increase activity slowly   Complete by:  As directed      Allergies as of 03/31/2017   No Known Allergies     Medication List    STOP taking these medications   aspirin EC 81 MG tablet   chlorthalidone 25 MG tablet Commonly known as:  HYGROTON     TAKE these medications   apixaban 2.5 MG Tabs tablet Commonly known as:  ELIQUIS Take 1 tablet (2.5 mg total) by mouth 2 (two) times daily.   atenolol 50 MG tablet Commonly known as:  TENORMIN TK 1 T PO QD   cyanocobalamin 1000 MCG/ML injection Commonly known as:  (VITAMIN B-12) Inject 1 mL (1,000 mcg total) into the skin daily for 6 days.   cyanocobalamin 2000 MCG tablet Take 1 tablet  (2,000 mcg total) by mouth daily. Start taking on:  04/07/2017   enalapril 10 MG tablet Commonly known as:  VASOTEC TK 1 T PO QD   gabapentin 300 MG capsule Commonly known as:  NEURONTIN Take 300 mg by mouth 3 (three) times daily.   metFORMIN 500 MG tablet Commonly known as:  GLUCOPHAGE TK 2 TS PO  BID   pravastatin 40 MG tablet Commonly known as:  PRAVACHOL TK 1 T PO QD      Follow-up Information    Rollene Rotunda, MD Follow up on 04/11/2017.   Specialty:  Cardiology Why:  Please arrive early for your 7:40am appointment Contact information: [address] [place] [address] [phone]        Renford Dills, MD. Call.   Specialty:  Internal Medicine Why:  Follow up within 1 week Contact information: [address] [place] [address] [phone]        Micki Riley, MD. Call.   Specialties:  Neurology, Radiology Why:  Follow up within 1-2 weeks Contact information: [address] [place] [address] [phone]          No Known Allergies  Consultations:  Cardiology   Neurology  Procedures/Studies: Ct Head Wo Contrast  Result Date: 03/30/2017 CLINICAL DATA:  Dizziness EXAM: CT HEAD WITHOUT CONTRAST TECHNIQUE: Contiguous axial images were obtained from the base of the skull through the vertex without intravenous contrast. COMPARISON:  04/05/2006 FINDINGS: Brain: There is no evidence for acute hemorrhage, hydrocephalus, mass lesion, or abnormal extra-axial fluid collection. No definite CT evidence for acute infarction. Diffuse loss of parenchymal volume is consistent with atrophy. Patchy low attenuation in the deep hemispheric and periventricular white matter is nonspecific, but likely reflects chronic microvascular ischemic demyelination. Vascular: No hyperdense vessel or unexpected calcification. Skull: Normal. Negative for fracture or focal lesion. Sinuses/Orbits: No acute finding. Other:  None. IMPRESSION: No acute intracranial abnormality. Atrophy with chronic small vessel white matter ischemic disease. Electronically Signed   By: Kennith Center M.D.   On: 03/30/2017 12:12   Mr Brain Wo Contrast  Result Date: 03/30/2017 CLINICAL DATA:  Initial evaluation for acute headache, new onset dizziness. EXAM: MRI HEAD WITHOUT CONTRAST MRI CERVICAL SPINE WITHOUT CONTRAST TECHNIQUE: Multiplanar, multiecho pulse sequences of the brain and surrounding structures, and cervical spine, to include the craniocervical junction and cervicothoracic junction, were obtained without intravenous contrast. COMPARISON:  Prior CT from earlier the same day. FINDINGS: MRI HEAD FINDINGS Brain: Diffuse prominence of the CSF containing spaces compatible with generalized cerebral atrophy. Patchy T2/FLAIR hyperintensity within the periventricular and deep white matter both cerebral hemispheres noted, nonspecific, but most likely related chronic small vessel ischemic changes, felt to be within normal limits for age. Probable tiny remote lacunar infarct noted within the left thalamus. No abnormal foci of restricted diffusion to suggest acute or subacute ischemia. Gray-white matter differentiation maintained. No other evidence for chronic infarction. No acute or chronic intracranial hemorrhage. No mass lesion, midline shift or mass effect. Mild diffuse ventricular prominence related global parenchymal volume loss of hydrocephalus. No extra-axial fluid collection. Major dural sinuses are patent. Pituitary gland suprasellar region normal. Midline structures intact and normal. Vascular: Major intracranial vascular flow voids are maintained. Probable mild atherosclerotic change noted within the left V4 segment. Skull and upper cervical spine: Craniocervical junction normal. Bone marrow signal intensity within normal limits. No scalp soft tissue abnormality. Sinuses/Orbits: Globes normal soft tissues within normal limits. Patient status  post cataract extraction bilaterally. Paranasal sinuses are clear. No mastoid effusion. Inner ear structures normal. Other: None. MRI CERVICAL SPINE FINDINGS Alignment: Examination markedly limited as the patient was unable to complete the entirety of the exam. Only sagittal T1 weighted image was performed. Straightening with slight reversal of the normal cervical lordosis, apex at C5-6. Trace anterolisthesis of C4 on C5 due to chronic facet degeneration. Trace retrolisthesis of C6 on C7. No other listhesis or malalignment. Vertebrae: Vertebral body heights are maintained without acute or chronic fracture. Underlying bone marrow signal intensity within normal limits. There is a well-circumscribed ovoid 12 mm lesion within the anterior aspect of the T4 vertebral body (series 2, image 5), indeterminate on this limited exam. No other discrete osseous lesions. Cord: Cervical spinal cord grossly within normal limits, although incompletely evaluated on this limited exam. Cord caliber within normal limits. Posterior Fossa, vertebral arteries, paraspinal tissues: Craniocervical junction within normal limits. Visualized paraspinous soft tissues within normal limits. No acute abnormality on this limited exam. Disc levels: Mild degenerative  spondylolysis noted at C5-6 and C6-7 without significant spinal stenosis. Multilevel facet hypertrophy noted throughout the cervical spine, incompletely evaluated on this exam. Degenerative changes identified on this limited exam. No severe canal stenosis or evidence for cord compression. Evaluation for foraminal narrowing limited on this exam. IMPRESSION: MRI HEAD IMPRESSION: 1. No acute intracranial abnormality. 2. Age appropriate cerebral atrophy with mild chronic small vessel ischemic disease. MRI CERVICAL SPINE IMPRESSION: 1. Markedly limited study due to the patient's inability to tolerate the full length of the exam. A sagittal T1 weighted sequence of the cervical spine only was  performed. Additionally, upon review of ectatic, MRI of the thoracic spine was ordered, but not performed. 2. No obvious acute abnormality identified on this limited exam. 3. Mild degenerative spondylolysis at C5-6 and C6-7 without significant spinal stenosis. 4. 12 mm T1 hypointense lesion within the T4 vertebral body, incompletely evaluated on this limited exam. Attention at follow-up recommended. Electronically Signed   By: Rise Mu M.D.   On: 03/30/2017 20:24   Mr Cervical Spine Wo Contrast  Result Date: 03/30/2017 CLINICAL DATA:  Initial evaluation for acute headache, new onset dizziness. EXAM: MRI HEAD WITHOUT CONTRAST MRI CERVICAL SPINE WITHOUT CONTRAST TECHNIQUE: Multiplanar, multiecho pulse sequences of the brain and surrounding structures, and cervical spine, to include the craniocervical junction and cervicothoracic junction, were obtained without intravenous contrast. COMPARISON:  Prior CT from earlier the same day. FINDINGS: MRI HEAD FINDINGS Brain: Diffuse prominence of the CSF containing spaces compatible with generalized cerebral atrophy. Patchy T2/FLAIR hyperintensity within the periventricular and deep white matter both cerebral hemispheres noted, nonspecific, but most likely related chronic small vessel ischemic changes, felt to be within normal limits for age. Probable tiny remote lacunar infarct noted within the left thalamus. No abnormal foci of restricted diffusion to suggest acute or subacute ischemia. Gray-white matter differentiation maintained. No other evidence for chronic infarction. No acute or chronic intracranial hemorrhage. No mass lesion, midline shift or mass effect. Mild diffuse ventricular prominence related global parenchymal volume loss of hydrocephalus. No extra-axial fluid collection. Major dural sinuses are patent. Pituitary gland suprasellar region normal. Midline structures intact and normal. Vascular: Major intracranial vascular flow voids are maintained.  Probable mild atherosclerotic change noted within the left V4 segment. Skull and upper cervical spine: Craniocervical junction normal. Bone marrow signal intensity within normal limits. No scalp soft tissue abnormality. Sinuses/Orbits: Globes normal soft tissues within normal limits. Patient status post cataract extraction bilaterally. Paranasal sinuses are clear. No mastoid effusion. Inner ear structures normal. Other: None. MRI CERVICAL SPINE FINDINGS Alignment: Examination markedly limited as the patient was unable to complete the entirety of the exam. Only sagittal T1 weighted image was performed. Straightening with slight reversal of the normal cervical lordosis, apex at C5-6. Trace anterolisthesis of C4 on C5 due to chronic facet degeneration. Trace retrolisthesis of C6 on C7. No other listhesis or malalignment. Vertebrae: Vertebral body heights are maintained without acute or chronic fracture. Underlying bone marrow signal intensity within normal limits. There is a well-circumscribed ovoid 12 mm lesion within the anterior aspect of the T4 vertebral body (series 2, image 5), indeterminate on this limited exam. No other discrete osseous lesions. Cord: Cervical spinal cord grossly within normal limits, although incompletely evaluated on this limited exam. Cord caliber within normal limits. Posterior Fossa, vertebral arteries, paraspinal tissues: Craniocervical junction within normal limits. Visualized paraspinous soft tissues within normal limits. No acute abnormality on this limited exam. Disc levels: Mild degenerative spondylolysis noted at C5-6 and C6-7 without  significant spinal stenosis. Multilevel facet hypertrophy noted throughout the cervical spine, incompletely evaluated on this exam. Degenerative changes identified on this limited exam. No severe canal stenosis or evidence for cord compression. Evaluation for foraminal narrowing limited on this exam. IMPRESSION: MRI HEAD IMPRESSION: 1. No acute  intracranial abnormality. 2. Age appropriate cerebral atrophy with mild chronic small vessel ischemic disease. MRI CERVICAL SPINE IMPRESSION: 1. Markedly limited study due to the patient's inability to tolerate the full length of the exam. A sagittal T1 weighted sequence of the cervical spine only was performed. Additionally, upon review of ectatic, MRI of the thoracic spine was ordered, but not performed. 2. No obvious acute abnormality identified on this limited exam. 3. Mild degenerative spondylolysis at C5-6 and C6-7 without significant spinal stenosis. 4. 12 mm T1 hypointense lesion within the T4 vertebral body, incompletely evaluated on this limited exam. Attention at follow-up recommended. Electronically Signed   By: Rise Mu M.D.   On: 03/30/2017 20:24   Mr Thoracic Spine Wo Contrast  Result Date: 03/30/2017 CLINICAL DATA:  Initial evaluation for acute headache, new onset dizziness. EXAM: MRI HEAD WITHOUT CONTRAST MRI CERVICAL SPINE WITHOUT CONTRAST TECHNIQUE: Multiplanar, multiecho pulse sequences of the brain and surrounding structures, and cervical spine, to include the craniocervical junction and cervicothoracic junction, were obtained without intravenous contrast. COMPARISON:  Prior CT from earlier the same day. FINDINGS: MRI HEAD FINDINGS Brain: Diffuse prominence of the CSF containing spaces compatible with generalized cerebral atrophy. Patchy T2/FLAIR hyperintensity within the periventricular and deep white matter both cerebral hemispheres noted, nonspecific, but most likely related chronic small vessel ischemic changes, felt to be within normal limits for age. Probable tiny remote lacunar infarct noted within the left thalamus. No abnormal foci of restricted diffusion to suggest acute or subacute ischemia. Gray-white matter differentiation maintained. No other evidence for chronic infarction. No acute or chronic intracranial hemorrhage. No mass lesion, midline shift or mass effect.  Mild diffuse ventricular prominence related global parenchymal volume loss of hydrocephalus. No extra-axial fluid collection. Major dural sinuses are patent. Pituitary gland suprasellar region normal. Midline structures intact and normal. Vascular: Major intracranial vascular flow voids are maintained. Probable mild atherosclerotic change noted within the left V4 segment. Skull and upper cervical spine: Craniocervical junction normal. Bone marrow signal intensity within normal limits. No scalp soft tissue abnormality. Sinuses/Orbits: Globes normal soft tissues within normal limits. Patient status post cataract extraction bilaterally. Paranasal sinuses are clear. No mastoid effusion. Inner ear structures normal. Other: None. MRI CERVICAL SPINE FINDINGS Alignment: Examination markedly limited as the patient was unable to complete the entirety of the exam. Only sagittal T1 weighted image was performed. Straightening with slight reversal of the normal cervical lordosis, apex at C5-6. Trace anterolisthesis of C4 on C5 due to chronic facet degeneration. Trace retrolisthesis of C6 on C7. No other listhesis or malalignment. Vertebrae: Vertebral body heights are maintained without acute or chronic fracture. Underlying bone marrow signal intensity within normal limits. There is a well-circumscribed ovoid 12 mm lesion within the anterior aspect of the T4 vertebral body (series 2, image 5), indeterminate on this limited exam. No other discrete osseous lesions. Cord: Cervical spinal cord grossly within normal limits, although incompletely evaluated on this limited exam. Cord caliber within normal limits. Posterior Fossa, vertebral arteries, paraspinal tissues: Craniocervical junction within normal limits. Visualized paraspinous soft tissues within normal limits. No acute abnormality on this limited exam. Disc levels: Mild degenerative spondylolysis noted at C5-6 and C6-7 without significant spinal stenosis. Multilevel facet  hypertrophy  noted throughout the cervical spine, incompletely evaluated on this exam. Degenerative changes identified on this limited exam. No severe canal stenosis or evidence for cord compression. Evaluation for foraminal narrowing limited on this exam. IMPRESSION: MRI HEAD IMPRESSION: 1. No acute intracranial abnormality. 2. Age appropriate cerebral atrophy with mild chronic small vessel ischemic disease. MRI CERVICAL SPINE IMPRESSION: 1. Markedly limited study due to the patient's inability to tolerate the full length of the exam. A sagittal T1 weighted sequence of the cervical spine only was performed. Additionally, upon review of ectatic, MRI of the thoracic spine was ordered, but not performed. 2. No obvious acute abnormality identified on this limited exam. 3. Mild degenerative spondylolysis at C5-6 and C6-7 without significant spinal stenosis. 4. 12 mm T1 hypointense lesion within the T4 vertebral body, incompletely evaluated on this limited exam. Attention at follow-up recommended. Electronically Signed   By: Rise Mu M.D.   On: 03/30/2017 20:24    ECHOCARDIOGRAM ------------------------------------------------------------------- Study Conclusions  - Left ventricle: The cavity size was normal. There was moderate   concentric hypertrophy. Systolic function was normal. The   estimated ejection fraction was in the range of 60% to 65%. Wall   motion was normal; there were no regional wall motion   abnormalities. - Aortic valve: Trileaflet; normal thickness, mildly calcified   leaflets. - Mitral valve: There was mild regurgitation. - Left atrium: The atrium was mildly dilated. - Pulmonary arteries: Systolic pressure was mildly increased. PA   peak pressure: 40 mm Hg (S). - Pericardium, extracardiac: A small pericardial effusion was   identified.  CAROTID DUPLEX Bilateral carotid duplex completed. There is a 1% to 39% ICA stenosis. Acoustic shadowing on the left may obscure  higher velocities . Vertebral artery flow is antegrade bilaterally.  Subjective: Seen and examined at bedside and refusing MRI's. No complaints and feels improved. Ready to go home.  Discharge Exam: Vitals:   03/31/17 0318 03/31/17 1300  BP: (!) 146/82   Pulse: (!) 103   Resp: 16   Temp: 98.7 F (37.1 C)   SpO2: 100% 99%   Vitals:   03/30/17 1826 03/30/17 2033 03/31/17 0318 03/31/17 1300  BP: (!) 179/80 (!) 171/81 (!) 146/82   Pulse: 81 89 (!) 103   Resp: 18 16 16    Temp: 98.4 F (36.9 C) 99 F (37.2 C) 98.7 F (37.1 C)   TempSrc: Oral Oral Oral   SpO2: 100% 100% 100% 99%  Weight: 58.2 kg (128 lb 4.8 oz)     Height: 5\' 3"  (1.6 m)      General: Pt is alert, awake, not in acute distress Cardiovascular: Irregularly Irregular, S1/S2 +, no rubs, no gallops Respiratory: CTA bilaterally, no wheezing, no rhonchi Abdominal: Soft, NT, ND, bowel sounds + Extremities: no edema, no cyanosis  The results of significant diagnostics from this hospitalization (including imaging, microbiology, ancillary and laboratory) are listed below for reference.    Microbiology: No results found for this or any previous visit (from the past 240 hour(s)).   Labs: BNP (last 3 results) No results for input(s): BNP in the last 8760 hours. Basic Metabolic Panel: Recent Labs  Lab 03/31/17 0831  NA 141  K 4.3  CL 104  CO2 29  GLUCOSE 169*  BUN 17  CREATININE 1.31*  CALCIUM 8.7*  MG 1.3*  PHOS 3.4   Liver Function Tests: Recent Labs  Lab 03/31/17 0831  AST 23  ALT 11*  ALKPHOS 55  BILITOT 0.9  PROT 6.4*  ALBUMIN 3.2*  No results for input(s): LIPASE, AMYLASE in the last 168 hours. No results for input(s): AMMONIA in the last 168 hours. CBC: Recent Labs  Lab 03/31/17 0831  WBC 5.9  NEUTROABS 3.0  HGB 11.1*  HCT 34.1*  MCV 82.2  PLT 223   Cardiac Enzymes: Recent Labs  Lab 03/30/17 1712 03/30/17 2036 03/31/17 0153  TROPONINI 0.05* 0.04* 0.06*   BNP: Invalid input(s):  POCBNP CBG: Recent Labs  Lab 03/30/17 2114 03/31/17 0759 03/31/17 1246 03/31/17 1635  GLUCAP 142* 93 133* 147*   D-Dimer No results for input(s): DDIMER in the last 72 hours. Hgb A1c No results for input(s): HGBA1C in the last 72 hours. Lipid Profile No results for input(s): CHOL, HDL, LDLCALC, TRIG, CHOLHDL, LDLDIRECT in the last 72 hours. Thyroid function studies No results for input(s): TSH, T4TOTAL, T3FREE, THYROIDAB in the last 72 hours.  Invalid input(s): FREET3 Anemia work up No results for input(s): VITAMINB12, FOLATE, FERRITIN, TIBC, IRON, RETICCTPCT in the last 72 hours. Urinalysis    Component Value Date/Time   COLORURINE YELLOW 03/30/2017 1136   APPEARANCEUR CLEAR 03/30/2017 1136   LABSPEC 1.012 03/30/2017 1136   PHURINE 6.0 03/30/2017 1136   GLUCOSEU NEGATIVE 03/30/2017 1136   HGBUR NEGATIVE 03/30/2017 1136   BILIRUBINUR NEGATIVE 03/30/2017 1136   KETONESUR NEGATIVE 03/30/2017 1136   PROTEINUR 100 (A) 03/30/2017 1136   UROBILINOGEN 0.2 10/29/2010 1614   NITRITE NEGATIVE 03/30/2017 1136   LEUKOCYTESUR SMALL (A) 03/30/2017 1136   Sepsis Labs Invalid input(s): PROCALCITONIN,  WBC,  LACTICIDVEN Microbiology No results found for this or any previous visit (from the past 240 hour(s)).  Time coordinating discharge: 35 minutes  SIGNED:  Merlene Laughtermair Latif Fread Kottke, DO [facility] 04/06/2017, 2:24 PM Pager [phone]  If 7PM-7AM, please contact night-coverage [email or web address] Password [email or web address]

## 2017-04-08 NOTE — Progress Notes (Signed)
Cardiology Office Note   Date:  04/11/2017   ID:  Mieshia, Pepitone 02/23/1930, MRN [id number]  PCP:  Renford Dills, MD  Cardiologist:   No primary care provider on file.   No chief complaint on file.   History of Present Illness: Jamie Mueller is a 84 y.o. female who presents for follow up of atrial fib.  She ws in the hospital earlier this month for that.  She presented with nausea, dizziness, chest pain.  She was seen in the ED by neurology and we were called secondary to pain and possible atrial fib.  She had minimally elevated troponin.  Echo was 60 - 65%.  She was started on Eliquis.  CT and MRI of the brain were unremarkable.    Reviewing the EKGs and available strips from the ED I do not see clear evidence of atrial fib but there is multifocal PACs.  It is difficult to assess the EKG.  Rhythm strips show MAT and the EKG the next day shows MAT.  She does not feel palpitations.  The patient denies any new symptoms such as chest discomfort, neck or arm discomfort. There has been no new shortness of breath, PND or orthopnea. There have been no reported palpitations, presyncope or syncope.     Past Medical History:  Diagnosis Date  . Diabetes mellitus without complication (HCC)   . Hypercholesteremia   . Hypertension    benign  . Memory loss   . PAF (paroxysmal atrial fibrillation) (HCC)   . Vitamin B 12 deficiency     Past Surgical History:  Procedure Laterality Date  . ABDOMINAL HYSTERECTOMY    . CATARACT EXTRACTION Left 03/2015     Current Outpatient Medications  Medication Sig Dispense Refill  . apixaban (ELIQUIS) 2.5 MG TABS tablet Take 1 tablet (2.5 mg total) by mouth 2 (two) times daily. 60 tablet 0  . atenolol (TENORMIN) 50 MG tablet TK 1 T PO QD  4  . enalapril (VASOTEC) 10 MG tablet TK 1 T PO QD  7  . gabapentin (NEURONTIN) 300 MG capsule Take 300 mg by mouth 3 (three) times daily.    . metFORMIN (GLUCOPHAGE) 500 MG tablet TK 2 TS PO  BID  3    . pravastatin (PRAVACHOL) 40 MG tablet TK 1 T PO QD  4  . vitamin B-12 2000 MCG tablet Take 1 tablet (2,000 mcg total) by mouth daily. 60 tablet 0   No current facility-administered medications for this visit.     Allergies:   Patient has no known allergies.    ROS:  Please see the history of present illness.   Otherwise, review of systems are positive for none.   All other systems are reviewed and negative.    PHYSICAL EXAM: VS:  BP (!) 184/82   Pulse 79   Ht 5\' 1"  (1.549 m)   Wt 133 lb (60.3 kg)   SpO2 97%   BMI 25.13 kg/m  , BMI Body mass index is 25.13 kg/m. GENERAL:  Well appearing NECK:  No jugular venous distention, waveform within normal limits, carotid upstroke brisk and symmetric, no bruits, no thyromegaly LUNGS:  Clear to auscultation bilaterally BACK:  No CVA tenderness CHEST:  Unremarkable HEART:  PMI not displaced or sustained,S1 and S2 within normal limits, no S3, no S4, no clicks, no rubs, no murmurs ABD:  Flat, positive bowel sounds normal in frequency in pitch, no bruits, no rebound, no guarding, no midline pulsatile mass,  no hepatomegaly, no splenomegaly EXT:  2 plus pulses throughout, no edema, no cyanosis no clubbing   EKG:  EKG is ordered today. The ekg ordered today demonstrates Sinus rhythm, rate 75, axis within normal limits, intervals within normal limits, no acute ST-T wave changes. PACs.     Recent Labs: 03/31/2017: ALT 11; BUN 17; Creatinine, Ser 1.31; Hemoglobin 11.1; Magnesium 1.3; Platelets 223; Potassium 4.3; Sodium 141; TSH 3.534   Lab Results  Component Value Date   HGBA1C 6.0 (H) 03/30/2017    Lipid Panel    Component Value Date/Time   CHOL  06/02/2009 0435    169        ATP III CLASSIFICATION:  <200     mg/dL   Desirable  [id number]  mg/dL   Borderline High  >=578>=240    mg/dL   High          TRIG 89 06/02/2009 0435   HDL 55 06/02/2009 0435   CHOLHDL 3.1 06/02/2009 0435   VLDL 18 06/02/2009 0435   LDLCALC  06/02/2009 0435    96         Total Cholesterol/HDL:CHD Risk Coronary Heart Disease Risk Table                     Men   Women  1/2 Average Risk   3.4   3.3  Average Risk       5.0   4.4  2 X Average Risk   9.6   7.1  3 X Average Risk  23.4   11.0        Use the calculated Patient Ratio above and the CHD Risk Table to determine the patient's CHD Risk.        ATP III CLASSIFICATION (LDL):  <100     mg/dL   Optimal  [id number]  mg/dL   Near or Above                    Optimal  130-159  mg/dL   Borderline  [id number]  mg/dL   High  >244>190     mg/dL   Very High      Wt Readings from Last 3 Encounters:  04/11/17 133 lb (60.3 kg)  03/30/17 128 lb 4.8 oz (58.2 kg)  02/25/16 125 lb (56.7 kg)      Other studies Reviewed: Additional studies/ records that were reviewed today include: ED records. Review of the above records demonstrates:  Please see elsewhere in the note.     ASSESSMENT AND PLAN:  ATRIAL FIB:  I cannot absolutely confirm that the initial EKG was atrial fib.  Subsequent studies were MAT and PACs.  I will apply a 3 week event monitor and if there is no atrial fib the family would prefer to stop the Eliquis.    HTN:    The blood BP was high today but this is not usual.  I have asked them to keep a BP diary which I will review when she comes back.    DM:   A1C was as above.  Continue current therapy   DYSLIPIDEMIA:  She has not wanted to take statin other than Pravastatin.  She will continue this.   CKD III:  This is followed Renford DillsPolite, Ronald, MD and was 1.31.     Current medicines are reviewed at length with the patient today.  The patient does not have concerns regarding medicines.  The following changes have been made:  no change  Labs/ tests ordered today include:   Orders Placed This Encounter  Procedures  . CARDIAC EVENT MONITOR  . EKG 12-Lead     Disposition:   FU with me in six weeks.     Signed, Rollene Rotunda, MD  04/11/2017 8:24 AM    [facility]

## 2017-04-09 ENCOUNTER — Encounter: Payer: Self-pay | Admitting: Cardiology

## 2017-04-11 ENCOUNTER — Ambulatory Visit (INDEPENDENT_AMBULATORY_CARE_PROVIDER_SITE_OTHER): Payer: Medicare Other

## 2017-04-11 ENCOUNTER — Encounter: Payer: Self-pay | Admitting: Cardiology

## 2017-04-11 ENCOUNTER — Ambulatory Visit: Payer: Medicare Other | Admitting: Cardiology

## 2017-04-11 VITALS — BP 184/82 | HR 79 | Ht 61.0 in | Wt 133.0 lb

## 2017-04-11 DIAGNOSIS — R002 Palpitations: Secondary | ICD-10-CM

## 2017-04-11 DIAGNOSIS — I499 Cardiac arrhythmia, unspecified: Secondary | ICD-10-CM

## 2017-04-11 NOTE — Patient Instructions (Signed)
Medication Instructions:  Continue current medications  If you need a refill on your cardiac medications before your next appointment, please call your pharmacy.  Labwork: None Ordered   Testing/Procedures: Your physician has recommended that you wear an event monitor for 30 days. Event monitors are medical devices that record the heart's electrical activity. Doctors most often us these monitors to diagnose arrhythmias. Arrhythmias are problems with the speed or rhythm of the heartbeat. The monitor is a small, portable device. You can wear one while you do your normal daily activities. This is usually used to diagnose what is causing palpitations/syncope (passing out).  Follow-Up: Your physician wants you to follow-up in: 6 Weeks.    Thank you for choosing [facility]!!

## 2017-04-30 ENCOUNTER — Telehealth: Payer: Self-pay | Admitting: Physician Assistant

## 2017-04-30 NOTE — Telephone Encounter (Signed)
Paged by Zollie Scalelivia of [employer] [phone] regarding critical EKG.  Patient just started on a 30-day monitor for monitoring for atrial fibrillation versus MAT versus PACs.  Around 6:37 AM this morning she had 18 seconds of recording that possibly represent atrial fibrillation.  Heart rate was about 100 bpm.  She quickly converted out of it.  The last EKG was sinus rhythm.  Patient has already been contacted by [employer], she is asymptomatic.  Instructed [employer] to fax the records to Dr. Jenene SlickerHochrein's office.

## 2017-05-05 NOTE — Telephone Encounter (Signed)
I need to review the final report when available.  The patient is currently on Eliquis

## 2017-05-20 NOTE — Telephone Encounter (Signed)
Pt have appt with Dr Antoine Poche on 05/10, result of event monitor in chart

## 2017-05-25 ENCOUNTER — Telehealth: Payer: Self-pay

## 2017-05-25 ENCOUNTER — Ambulatory Visit: Payer: Medicare Other | Admitting: Neurology

## 2017-05-25 NOTE — Telephone Encounter (Signed)
Patient no-show for appt

## 2017-05-26 NOTE — Progress Notes (Signed)
Cardiology Office Note   Date:  05/27/2017   ID:  Jamie, Mueller [birth year], MRN [id number]  PCP:  Renford Dills, MD  Cardiologist:   No primary care provider on file.   Chief Complaint  Patient presents with  . Atrial Fibrillation    History of Present Illness: Jamie Mueller is a 84 y.o. female who presents for follow up of atrial fib.  She ws in the hospital earlier this year for that.  She presented with nausea, dizziness, chest pain.  She was seen in the ED by neurology and we were called secondary to pain and possible atrial fib.  She had minimally elevated troponin.  Echo was 60 - 65%.  She was started on Eliquis.  CT and MRI of the brain were unremarkable.  After the last visit I ordered an event recorder.  I reviewed this and there was possible atrial fib although artifact precluded adequate analysis.  She comes back today to discuss this.    She feels well.  She is not having any palpitations, presyncope or syncope.  She denies any chest pressure, neck or arm discomfort.  She has had no weight gain or edema.     Past Medical History:  Diagnosis Date  . Diabetes mellitus without complication (HCC)   . Hypercholesteremia   . Hypertension    benign  . Memory loss   . PAF (paroxysmal atrial fibrillation) (HCC)   . Vitamin B 12 deficiency     Past Surgical History:  Procedure Laterality Date  . ABDOMINAL HYSTERECTOMY    . CATARACT EXTRACTION Left 03/2015     Current Outpatient Medications  Medication Sig Dispense Refill  . apixaban (ELIQUIS) 2.5 MG TABS tablet Take 1 tablet (2.5 mg total) by mouth 2 (two) times daily. 60 tablet 0  . atenolol (TENORMIN) 50 MG tablet TK 1 T PO QD  4  . enalapril (VASOTEC) 10 MG tablet Take 1 tablet (10 mg total) by mouth 2 (two) times daily. 60 tablet 11  . gabapentin (NEURONTIN) 300 MG capsule Take 300 mg by mouth 3 (three) times daily.    . metFORMIN (GLUCOPHAGE) 500 MG tablet TK 2 TS PO  BID  3  . pravastatin  (PRAVACHOL) 40 MG tablet TK 1 T PO QD  4  . vitamin B-12 2000 MCG tablet Take 1 tablet (2,000 mcg total) by mouth daily. 60 tablet 0   No current facility-administered medications for this visit.     Allergies:   Patient has no known allergies.    ROS:  Please see the history of present illness.   Otherwise, review of systems are positive for none.   All other systems are reviewed and negative.    PHYSICAL EXAM: VS:  BP (!) 172/83   Pulse 86   Ht  (1.575 m)   Wt 135 lb 3.2 oz (61.3 kg)   SpO2 96%   BMI 24.73 kg/m  , BMI Body mass index is 24.73 kg/m.  GENERAL:  Well appearing NECK:  No jugular venous distention, waveform within normal limits, carotid upstroke brisk and symmetric, no bruits, no thyromegaly LUNGS:  Clear to auscultation bilaterally CHEST:  Unremarkable HEART:  PMI not displaced or sustained,S1 and S2 within normal limits, no S3, no S4, no clicks, no rubs, no murmurs ABD:  Flat, positive bowel sounds normal in frequency in pitch, no bruits, no rebound, no guarding, no midline pulsatile mass, no hepatomegaly, no splenomegaly EXT:  2 plus pulses  throughout, no edema, no cyanosis no clubbing   EKG:  EKG is not ordered today.    Recent Labs: 03/31/2017: ALT 11; BUN 17; Creatinine, Ser 1.31; Hemoglobin 11.1; Magnesium 1.3; Platelets 223; Potassium 4.3; Sodium 141; TSH 3.534   Lab Results  Component Value Date   HGBA1C 6.0 (H) 03/30/2017    Lipid Panel    Component Value Date/Time   CHOL  06/02/2009 0435    169        ATP III CLASSIFICATION:  <200     mg/dL   Desirable  914-782  mg/dL   Borderline High  >=956    mg/dL   High          TRIG 89 06/02/2009 0435   HDL 55 06/02/2009 0435   CHOLHDL 3.1 06/02/2009 0435   VLDL 18 06/02/2009 0435   LDLCALC  06/02/2009 0435    96        Total Cholesterol/HDL:CHD Risk Coronary Heart Disease Risk Table                     Men   Women  1/2 Average Risk   3.4   3.3  Average Risk       5.0   4.4  2 X Average  Risk   9.6   7.1  3 X Average Risk  23.4   11.0        Use the calculated Patient Ratio above and the CHD Risk Table to determine the patient's CHD Risk.        ATP III CLASSIFICATION (LDL):  <100     mg/dL   Optimal  213-086  mg/dL   Near or Above                    Optimal  130-159  mg/dL   Borderline  578-469  mg/dL   High  >629     mg/dL   Very High      Wt Readings from Last 3 Encounters:  05/27/17 135 lb 3.2 oz (61.3 kg)  04/11/17 133 lb (60.3 kg)  03/30/17 128 lb 4.8 oz (58.2 kg)      Other studies Reviewed: Additional studies/ records that were reviewed today include: Event recorder Review of the above records demonstrates:      ASSESSMENT AND PLAN:  ATRIAL FIB: I could not exclude atrial fibrillation as above on the event monitor.  I had a long discussion with her and her family about this.  Given this I have suggested that she restart the Eliquis.  They agreed to do this but would like further monitoring with an implanted loop.  They want to check with her insurance company to see what the cost of this would be.  She will not be taking any aspirin.  They will contact me after they have done that investigation and we will consider getting the implanted loop and if there is no evidence in the future of atrial fibrillation they would prefer to stop the Eliquis.  HTN:    The BP slightly elevated as it has been.  I am going to increase the enalapril twice daily and get a basic metabolic profile in 10 days.  DM:   A1C was as above.  No change in therapy.   DYSLIPIDEMIA:  She has not wanted to take statin other than Pravastatin.   Continue medical management.  CKD III:    Creat 1.31 most recently.  I will follow this as  above.   Current medicines are reviewed at length with the patient today.  The patient does not have concerns regarding medicines.  The following changes have been made:    Labs/ tests ordered today include:     Orders Placed This Encounter    Procedures  . Basic Metabolic Panel (BMET)     Disposition:   FU with me in 8 weeks.     Signed, Rollene Rotunda, MD  05/27/2017 9:47 AM    [facility]

## 2017-05-27 ENCOUNTER — Ambulatory Visit: Payer: Medicare Other | Admitting: Cardiology

## 2017-05-27 ENCOUNTER — Encounter: Payer: Self-pay | Admitting: Cardiology

## 2017-05-27 VITALS — BP 172/83 | HR 86 | Ht 62.0 in | Wt 135.2 lb

## 2017-05-27 DIAGNOSIS — I10 Essential (primary) hypertension: Secondary | ICD-10-CM | POA: Diagnosis not present

## 2017-05-27 DIAGNOSIS — N183 Chronic kidney disease, stage 3 unspecified: Secondary | ICD-10-CM | POA: Insufficient documentation

## 2017-05-27 DIAGNOSIS — Z79899 Other long term (current) drug therapy: Secondary | ICD-10-CM

## 2017-05-27 DIAGNOSIS — I480 Paroxysmal atrial fibrillation: Secondary | ICD-10-CM

## 2017-05-27 MED ORDER — ENALAPRIL MALEATE 10 MG PO TABS: 10.0000 mg | ORAL_TABLET | Freq: Two times a day (BID) | ORAL | 11 refills | Status: DC

## 2017-05-27 NOTE — Patient Instructions (Addendum)
Medication Instructions:  INCREASE- Enalapril 10 mg twice a day  If you need a refill on your cardiac medications before your next appointment, please call your pharmacy.  Labwork: BMP in 10 days HERE IN OUR OFFICE AT [facility]  Take the provided lab slips with you to the lab for your blood draw.   You will NOT need to fast   Testing/Procedures: None Ordered  Special Instructions: Implantable Loop/Link  Follow-Up: Your physician wants you to follow-up in: 2 Months.      Thank you for choosing [facility] at [facility]!!

## 2017-06-17 ENCOUNTER — Telehealth: Payer: Self-pay | Admitting: Cardiology

## 2017-06-17 NOTE — Telephone Encounter (Signed)
New Message    Patients daughter Meriam SpragueBeverly is calling on behalf of patient. Patient has decided to move forward with the loop recorder. Please call to discuss.

## 2017-06-17 NOTE — Telephone Encounter (Signed)
Spoke with daughter and she was told by a friend insurance would cover the loop recorder. Advised daughter that her mother may need to pay a percentage even if covered. She is wanting an approximate amount before she calls insurance. I explained I would try to obtain but am unsure if that will be doable. Sent message to billing department and will call daughter once I hear back from them.

## 2017-06-21 NOTE — Telephone Encounter (Signed)
lmtcb

## 2017-06-21 NOTE — Telephone Encounter (Signed)
Spoke with billing and daughter will have to call hospital to get estimate but has to be scheduled. Will [facility] with Dr Royann Shiversroitoru to move forward with scheduling appointment/loop. Left message for daughter to call back

## 2017-07-06 NOTE — Telephone Encounter (Signed)
Attempted to call Jamie SpragueBeverly again, no answer, mail box is full.

## 2017-07-06 NOTE — Telephone Encounter (Addendum)
Returned call to daughter again last week, 06/28/17. Daughter inquired about cost of procedure. I advised her that this procedure needed to be scheduled before insurance can be filed and finances can/would be discussed with a representative from the hospital. She agreed to scheduling. I scheduled loop recorder implant for 07/14/17 at 1:00p. Message sent to pre-cert. Advised her I would discuss with Dr C whether patient would need to hold Eliquis for any period of time leading up to procedure.   Per Dr C regarding Eliquis - No, I would have her continue it.   Returned call to daughter today, 07/06/17, to advise her to have patient continue Eliquis leading up to the procedure next week. Daughter has yet to hear from the hospital regarding cost. Claudette Headdvised Beverly today that I would speak with someone on our billing team directly to address the case.  Spoke with Erskine Squibbhonda Williams in billing. The case is currently in the workqueue. Was advised by Bjorn Loserhonda to [facility] [phone], opt #2).They can discuss the financial responsibility of the patient. If they wish to cancel the procedure based on that conversation they can do that at the same time.  Attempted to call Meriam SpragueBeverly again, no answer, mail box is full.

## 2017-07-11 ENCOUNTER — Telehealth: Payer: Self-pay | Admitting: Cardiology

## 2017-07-11 NOTE — Telephone Encounter (Signed)
New message:      Pt's daughter is calling and states she has questions about her upcoming procedure. She states they have got no information about this surgery. She has billing questions as well and I reached out to billing but unable to reach anyone at that moment. Pt's daughter states if they do not get a call back she will be a no show and it will not be there fault.

## 2017-07-11 NOTE — Telephone Encounter (Signed)
Left detailed message regarding [facility] information.

## 2017-07-11 NOTE — Telephone Encounter (Signed)
Discussed case with billing.   Internal billing dept Jamie Mueller(Stacy Collins) made contact with Jamie Mueller on 06/21/17 and provided [facility] information along with CPT code for loop recorder implant.  Jamie Mueller (with our internal billing department) called Jamie Mueller today, 07/11/17. Gave daughter correct [facility] info [phone], opt#1) and offered to call the center herself tomorrow. Jamie Mueller documented the results of their conversation.

## 2017-07-11 NOTE — Telephone Encounter (Signed)
Please see telephone note from 06/17/17 for previous communication.  Left detailed message regarding [facility] information.

## 2017-07-13 ENCOUNTER — Telehealth: Payer: Self-pay | Admitting: Cardiology

## 2017-07-13 NOTE — Telephone Encounter (Signed)
Called patients daughter Meriam Sprague(Beverly) back re: the message she left to discuss and cancel the Loop recorder insert tomorrow. She was very abrupt on the phone and verbalized that she already spoke with someone about this and it had already been cancelled and did not wish to further discuss. I advised her that I was sorry for calling back and was wondering who she spoke with but she eventually hung up. Cath lab notified of the cancellation.

## 2017-07-13 NOTE — Telephone Encounter (Signed)
New Message   Patients daughter Meriam SpragueBeverly is calling to cancel appointment for her mother loop recorder implant. Please call to discuss,

## 2017-07-14 ENCOUNTER — Ambulatory Visit (HOSPITAL_COMMUNITY): Admission: RE | Admit: 2017-07-14 | Payer: Medicare Other | Source: Ambulatory Visit | Admitting: Cardiovascular Disease

## 2017-07-14 ENCOUNTER — Encounter (HOSPITAL_COMMUNITY): Admission: RE | Payer: Self-pay | Source: Ambulatory Visit

## 2017-07-14 SURGERY — LOOP RECORDER INSERTION
Anesthesia: LOCAL

## 2017-07-26 ENCOUNTER — Ambulatory Visit: Payer: Medicare Other

## 2017-08-06 NOTE — Progress Notes (Signed)
Cardiology Office Note   Date:  08/08/2017   ID:  Jamie Mueller, DOB November 22, 1930, MRN [id number]  PCP:  Renford DillsPolite, Ronald, MD  Cardiologist:   No primary care provider on file.   Chief Complaint  Patient presents with  . Atrial Fibrillation    History of Present Illness: Jamie Mueller is a 84 y.o. female who presents for follow up of atrial fib.  She ws in the hospital earlier this year for that.  She presented with nausea, dizziness, chest pain.  She was seen in the ED by neurology and we were called secondary to pain and possible atrial fib.  She had minimally elevated troponin.  Echo was 60 - 65%.  She was started on Eliquis.  CT and MRI of the brain were unremarkable.  After the last visit I ordered an event recorder.  I reviewed this and there was possible atrial fib although artifact precluded adequate analysis.  She came back to discuss this and we planned a loop recorder.  However, this was cancelled by the family apparently secondary to cost.    She feels well.  She walks routinely to the mailbox.  She goes to church.  She was at the dinner.  She goes grocery shopping.  With this she denies any cardiovascular symptoms.  She does not notice any palpitations, presyncope or syncope.  She has no PND or orthopnea.  She has none of the presenting symptoms that she had to include chest pain.     Past Medical History:  Diagnosis Date  . Diabetes mellitus without complication (HCC)   . Hypercholesteremia   . Hypertension    benign  . Memory loss   . PAF (paroxysmal atrial fibrillation) (HCC)   . Vitamin B 12 deficiency     Past Surgical History:  Procedure Laterality Date  . ABDOMINAL HYSTERECTOMY    . CATARACT EXTRACTION Left 03/2015     Current Outpatient Medications  Medication Sig Dispense Refill  . apixaban (ELIQUIS) 2.5 MG TABS tablet Take 1 tablet (2.5 mg total) by mouth 2 (two) times daily. 60 tablet 0  . atenolol (TENORMIN) 50 MG tablet TK 1 T PO QD  4    . enalapril (VASOTEC) 10 MG tablet Take 1 tablet (10 mg total) by mouth 2 (two) times daily. 60 tablet 11  . gabapentin (NEURONTIN) 300 MG capsule Take 300 mg by mouth 3 (three) times daily.    . metFORMIN (GLUCOPHAGE) 500 MG tablet TK 2 TS PO  BID  3  . pravastatin (PRAVACHOL) 40 MG tablet TK 1 T PO QD  4  . vitamin B-12 2000 MCG tablet Take 1 tablet (2,000 mcg total) by mouth daily. 60 tablet 0   No current facility-administered medications for this visit.     Allergies:   Patient has no known allergies.    ROS:  Please see the history of present illness.   Otherwise, review of systems are positive for none.   All other systems are reviewed and negative.    PHYSICAL EXAM: VS:  BP 140/73 (BP Location: Left Arm, Patient Position: Sitting, Cuff Size: Normal)   Pulse 71   Ht 5\' 2"  (1.575 m)   Wt 130 lb (59 kg)   BMI 23.78 kg/m  , BMI Body mass index is 23.78 kg/m.  GENERAL:  Well appearing NECK:  No jugular venous distention, waveform within normal limits, carotid upstroke brisk and symmetric, no bruits, no thyromegaly LUNGS:  Clear to auscultation bilaterally  CHEST:  Unremarkable HEART:  PMI not displaced or sustained,S1 and S2 within normal limits, no S3, no S4, no clicks, no rubs, no murmurs ABD:  Flat, positive bowel sounds normal in frequency in pitch, no bruits, no rebound, no guarding, no midline pulsatile mass, no hepatomegaly, no splenomegaly EXT:  2 plus pulses throughout, no edema, no cyanosis no clubbing    EKG:  EKG is not ordered today.    Recent Labs: 03/31/2017: ALT 11; BUN 17; Creatinine, Ser 1.31; Hemoglobin 11.1; Magnesium 1.3; Platelets 223; Potassium 4.3; Sodium 141; TSH 3.534   Lab Results  Component Value Date   HGBA1C 6.0 (H) 03/30/2017    Lipid Panel    Component Value Date/Time   CHOL  06/02/2009 0435    169        ATP III CLASSIFICATION:  <200     mg/dL   Desirable  478-295  mg/dL   Borderline High  >=621    mg/dL   High          TRIG  89 06/02/2009 0435   HDL 55 06/02/2009 0435   CHOLHDL 3.1 06/02/2009 0435   VLDL 18 06/02/2009 0435   LDLCALC  06/02/2009 0435    96        Total Cholesterol/HDL:CHD Risk Coronary Heart Disease Risk Table                     Men   Women  1/2 Average Risk   3.4   3.3  Average Risk       5.0   4.4  2 X Average Risk   9.6   7.1  3 X Average Risk  23.4   11.0        Use the calculated Patient Ratio above and the CHD Risk Table to determine the patient's CHD Risk.        ATP III CLASSIFICATION (LDL):  <100     mg/dL   Optimal  308-657  mg/dL   Near or Above                    Optimal  130-159  mg/dL   Borderline  846-962  mg/dL   High  >952     mg/dL   Very High      Wt Readings from Last 3 Encounters:  08/08/17 130 lb (59 kg)  05/27/17 135 lb 3.2 oz (61.3 kg)  04/11/17 133 lb (60.3 kg)      Other studies Reviewed: Additional studies/ records that were reviewed today include: Labs Review of the above records demonstrates:   NA   ASSESSMENT AND PLAN:  ATRIAL FIB:    We had long discussions about this.  She does appear to have atrial fibrillation though I could not absolutely confirm this because of artifact.  Given the preponderance of evidence after long discussion the patient family and I agree to continue with the current dose of Eliquis.  At this point they do not want a loop monitor without knowing exactly how much it would cost.  Of note she does have a mild anemia.  This has been stable.  There is been no evidence of GI bleeding or blood loss.  They are going to see next month and have asked him to ask for a CBC.  HTN:    The blood pressure is at target. No change in medications is indicated. We will continue with therapeutic lifestyle changes (TLC).  DM:  A1c is 6.1.  This is followed per Renford Dills, MD   DYSLIPIDEMIA:    LDL was 97.  She agrees to take pravastatin but has not wanted to be switched to another medicine.  No change in therapy.    CKD III:    Creatinine was 1.36.  She remains on the meds as listed.    Current medicines are reviewed at length with the patient today.  The patient does not have concerns regarding medicines.  The following changes have been made:  None  Labs/ tests ordered today include:   None  No orders of the defined types were placed in this encounter.    Disposition:   FU with me in 12 months.   Signed, Rollene Rotunda, MD  08/08/2017 9:47 AM    [facility]

## 2017-08-08 ENCOUNTER — Encounter: Payer: Self-pay | Admitting: Cardiology

## 2017-08-08 ENCOUNTER — Ambulatory Visit: Payer: Medicare Other | Admitting: Cardiology

## 2017-08-08 VITALS — BP 140/73 | HR 71 | Ht 62.0 in | Wt 130.0 lb

## 2017-08-08 DIAGNOSIS — E785 Hyperlipidemia, unspecified: Secondary | ICD-10-CM | POA: Diagnosis not present

## 2017-08-08 DIAGNOSIS — I10 Essential (primary) hypertension: Secondary | ICD-10-CM | POA: Diagnosis not present

## 2017-08-08 DIAGNOSIS — I480 Paroxysmal atrial fibrillation: Secondary | ICD-10-CM

## 2017-08-08 NOTE — Patient Instructions (Signed)
Medication Instructions:  Continue current medications  If you need a refill on your cardiac medications before your next appointment, please call your pharmacy.  Labwork: None Ordered   Testing/Procedures: None Ordered  Follow-Up: Your physician wants you to follow-up in: 1 Year. You should receive a reminder letter in the mail two months in advance. If you do not receive a letter, please call our office [phone].    Thank you for choosing [facility]!!      

## 2017-12-28 ENCOUNTER — Encounter: Payer: Self-pay | Admitting: Podiatry

## 2017-12-28 ENCOUNTER — Ambulatory Visit: Payer: Medicare Other | Admitting: Podiatry

## 2017-12-28 VITALS — BP 126/82

## 2017-12-28 DIAGNOSIS — M79674 Pain in right toe(s): Secondary | ICD-10-CM | POA: Diagnosis not present

## 2017-12-28 DIAGNOSIS — E1151 Type 2 diabetes mellitus with diabetic peripheral angiopathy without gangrene: Secondary | ICD-10-CM | POA: Diagnosis not present

## 2017-12-28 DIAGNOSIS — L84 Corns and callosities: Secondary | ICD-10-CM

## 2017-12-28 DIAGNOSIS — M79675 Pain in left toe(s): Secondary | ICD-10-CM | POA: Diagnosis not present

## 2017-12-28 DIAGNOSIS — B351 Tinea unguium: Secondary | ICD-10-CM

## 2017-12-28 NOTE — Patient Instructions (Addendum)
Diabetes and Foot Care Diabetes may cause you to have problems because of poor blood supply (circulation) to your feet and legs. This may cause the skin on your feet to become thinner, break easier, and heal more slowly. Your skin may become dry, and the skin may peel and crack. You may also have nerve damage in your legs and feet causing decreased feeling in them. You may not notice minor injuries to your feet that could lead to infections or more serious problems. Taking care of your feet is one of the most important things you can do for yourself. Follow these instructions at home:  Wear shoes at all times, even in the house. Do not go barefoot. Bare feet are easily injured.  Check your feet daily for blisters, cuts, and redness. If you cannot see the bottom of your feet, use a mirror or ask someone for help.  Wash your feet with warm water (do not use hot water) and mild soap. Then pat your feet and the areas between your toes until they are completely dry. Do not soak your feet as this can dry your skin.  Apply a moisturizing lotion or petroleum jelly (that does not contain alcohol and is unscented) to the skin on your feet and to dry, brittle toenails. Do not apply lotion between your toes.  Trim your toenails straight across. Do not dig under them or around the cuticle. File the edges of your nails with an emery board or nail file.  Do not cut corns or calluses or try to remove them with medicine.  Wear clean socks or stockings every day. Make sure they are not too tight. Do not wear knee-high stockings since they may decrease blood flow to your legs.  Wear shoes that fit properly and have enough cushioning. To break in new shoes, wear them for just a few hours a day. This prevents you from injuring your feet. Always look in your shoes before you put them on to be sure there are no objects inside.  Do not cross your legs. This may decrease the blood flow to your feet.  If you find a  minor scrape, cut, or break in the skin on your feet, keep it and the skin around it clean and dry. These areas may be cleansed with mild soap and water. Do not cleanse the area with peroxide, alcohol, or iodine.  When you remove an adhesive bandage, be sure not to damage the skin around it.  If you have a wound, look at it several times a day to make sure it is healing.  Do not use heating pads or hot water bottles. They may burn your skin. If you have lost feeling in your feet or legs, you may not know it is happening until it is too late.  Make sure your health care provider performs a complete foot exam at least annually or more often if you have foot problems. Report any cuts, sores, or bruises to your health care provider immediately. Contact a health care provider if:  You have an injury that is not healing.  You have cuts or breaks in the skin.  You have an ingrown nail.  You notice redness on your legs or feet.  You feel burning or tingling in your legs or feet.  You have pain or cramps in your legs and feet.  Your legs or feet are numb.  Your feet always feel cold. Get help right away if:  There is increasing   redness, swelling, or pain in or around a wound.  There is a red line that goes up your leg.  Pus is coming from a wound.  You develop a fever or as directed by your health care provider.  You notice a bad smell coming from an ulcer or wound. This information is not intended to replace advice given to you by your health care provider. Make sure you discuss any questions you have with your health care provider. Document Released: 01/02/2000 Document Revised: 06/12/2015 Document Reviewed: 06/13/2012 Elsevier Interactive Patient Education  2017 [employer]  Corns and Calluses Corns are small areas of thickened skin that occur on the top, sides, or tip of a toe. They contain a cone-shaped core with a point that can press on a nerve below. This causes pain.  Calluses are areas of thickened skin that can occur anywhere on the body including hands, fingers, palms, soles of the feet, and heels.Calluses are usually larger than corns. What are the causes? Corns and calluses are caused by rubbing (friction) or pressure, such as from shoes that are too tight or do not fit properly. What increases the risk? Corns are more likely to develop in people who have toe deformities, such as hammer toes. Since calluses can occur with friction to any area of the skin, calluses are more likely to develop in people who: Work with their hands. Wear shoes that fit poorly, shoes that are too tight, or shoes that are high-heeled. Have toes deformities.  What are the signs or symptoms? Symptoms of a corn or callus include: A hard growth on the skin. Pain or tenderness under the skin. Redness and swelling. Increased discomfort while wearing tight-fitting shoes.  How is this diagnosed? Corns and calluses may be diagnosed with a medical history and physical exam. How is this treated? Corns and calluses may be treated with: Removing the cause of the friction or pressure. This may include: Changing your shoes. Wearing shoe inserts (orthotics) or other protective layers in your shoes, such as a corn pad. Wearing gloves. Medicines to help soften skin in the hardened, thickened areas. Reducing the size of the corn or callus by removing the dead layers of skin. Antibiotic medicines to treat infection. Surgery, if a toe deformity is the cause.  Follow these instructions at home: Take medicines only as directed by your health care provider. If you were prescribed an antibiotic, finish all of it even if you start to feel better. Wear shoes that fit well. Avoid wearing high-heeled shoes and shoes that are too tight or too loose. Wear any padding, protective layers, gloves, or orthotics as directed by your health care provider. Soak your hands or feet and then use a file  or pumice stone to soften your corn or callus. Do this as directed by your health care provider. Check your corn or callus every day for signs of infection. Watch for: Redness, swelling, or pain. Fluid, blood, or pus. Contact a health care provider if: Your symptoms do not improve with treatment. You have increased redness, swelling, or pain at the site of your corn or callus. You have fluid, blood, or pus coming from your corn or callus. You have new symptoms. This information is not intended to replace advice given to you by your health care provider. Make sure you discuss any questions you have with your health care provider. Document Released: 10/11/2003 Document Revised: 07/25/2015 Document Reviewed: 12/31/2013 Elsevier Interactive Patient Education  2018 Elsevier Inc.  

## 2017-12-28 NOTE — Progress Notes (Signed)
Subjective: Jamie Mueller presents today referred by Renford Dills, MD.  She is accompanied by her son and has h/o diabetes and cc of painful, discolored, thick toenails which interfere with daily activities.  Pain is aggravated when wearing enclosed shoe gear.    Past Medical History:  Diagnosis Date  . Diabetes mellitus without complication (HCC)   . Hypercholesteremia   . Hypertension    benign  . Memory loss   . PAF (paroxysmal atrial fibrillation) (HCC)   . Vitamin B 12 deficiency     Patient Active Problem List   Diagnosis Date Noted  . Medication management 05/27/2017  . PAF (paroxysmal atrial fibrillation) (HCC) 05/27/2017  . CKD (chronic kidney disease), stage III (HCC) 05/27/2017  . Cardiac arrhythmia 04/11/2017  . Palpitation 04/11/2017  . Chest discomfort 03/30/2017  . Essential hypertension 03/30/2017  . Type 2 diabetes mellitus without complication (HCC) 03/30/2017  . Hyperlipidemia 03/30/2017    Past Surgical History:  Procedure Laterality Date  . ABDOMINAL HYSTERECTOMY    . CATARACT EXTRACTION Left 03/2015     Current Outpatient Medications:  .  apixaban (ELIQUIS) 2.5 MG TABS tablet, Take 1 tablet (2.5 mg total) by mouth 2 (two) times daily., Disp: 60 tablet, Rfl: 0 .  atenolol (TENORMIN) 50 MG tablet, TK 1 T PO QD, Disp: , Rfl: 4 .  chlorthalidone (HYGROTON) 25 MG tablet, , Disp: , Rfl: 2 .  enalapril (VASOTEC) 10 MG tablet, Take 1 tablet (10 mg total) by mouth 2 (two) times daily., Disp: 60 tablet, Rfl: 11 .  gabapentin (NEURONTIN) 300 MG capsule, Take 300 mg by mouth 3 (three) times daily., Disp: , Rfl:  .  metFORMIN (GLUCOPHAGE) 500 MG tablet, TK 2 TS PO  BID, Disp: , Rfl: 3 .  pravastatin (PRAVACHOL) 40 MG tablet, TK 1 T PO QD, Disp: , Rfl: 4 .  vitamin B-12 2000 MCG tablet, Take 1 tablet (2,000 mcg total) by mouth daily., Disp: 60 tablet, Rfl: 0  No Known Allergies  Social History   Occupational History  . Not on file  Tobacco Use  .  Smoking status: Never Smoker  . Smokeless tobacco: Never Used  Substance and Sexual Activity  . Alcohol use: No    Alcohol/week: 0.0 standard drinks  . Drug use: No  . Sexual activity: Not on file    Family History  Problem Relation Age of Onset  . Cancer Father        lung     There is no immunization history on file for this patient.   Review of systems: Positive Findings in bold print.  Constitutional:  chills, fatigue, fever, sweats, weight change Communication: Nurse, learning disability, sign Presenter, broadcasting, hand writing, iPad/Android device Eyes: diplopia, glare,  light sensitivity, eyeglasses, blindness Ears nose mouth throat: Hard of hearing, deaf, sign language,  vertigo,   bloody nose,  rhinitis,  cold sores, snoring Cardiovascular: HTN, edema, arrhythmia, pacemaker in place, defibrillator in place,  chest pain/tightness, chronic anticoagulation, blood clot Respiratory:  difficulty breathing, denies congestion, SOB, wheezing, cough Gastrointestinal: abdominal pain, diarrhea, nausea, vomiting,  Genitourinary:  nocturia,  pain on urination,  blood in urine, Foley catheter, urinary urgency Musculoskeletal: Uses mobility aid,  cramping, stiff joints, painful joints,  Skin: +changes in toenails, color change dryness, itchy skin, mole changes, or rash  Neurological: numbness, paresthesias, burning in feet, denies fainting,  seizure, change in speech. denies headaches, memory problems/poor historian, cerebral palsy Endocrine: diabetes, hypothyroidism, hyperthyroidism,  dry mouth, flushing, denies heat intolerance,  cold intolerance,  excessive thirst, denies polyuria,  nocturia Hematological:  easy bleeding,  excessive bleeding, easy bruising, enlarged lymph nodes, on long term blood thinner Allergy/immunological:  hives, frequent infections, multiple drug allergies, seasonal allergies,  Psychiatric:  anxiety, depression, mood disorder, suicidal ideations, hallucinations    Objective: Vascular Examination: Capillary refill time <3 seconds x 10 digits Dorsalis pedis 2/4 b/l Posterior tibial pulses absent b/l No digital hair x 10 digits Skin temperature gradient WNL b/l  Dermatological Examination: Skin with normal turgor, texture and tone b/l  Toenails 1-5 b/l discolored, thick, dystrophic with subungual debris and pain with palpation to nailbeds due to thickness of nails.  Hyperkeratotic lesion submet head 5 b/l, dorsal PIPJ left 5th digit  Musculoskeletal: Muscle strength 5/5 to all LE muscle groups  HAV with bunion b/l  Hammertoes 2-5 b/l   Neurological: Sensation diminished with 10 gram monofilament Vibratory sensation diminished b/l  Assessment: 1. Painful onychomycosis toenails 1-5 b/l  2. Calluses submet head 5 b/l; dorsal 5th digit PIPJ left foot 3. NIDDM with PAD  Plan: 1. Discussed diabetic foot care principles. Literature dispensed on today. 2. Toenails 1-5 b/l were debrided in length and girth without iatrogenic bleeding. 3. Patient to continue soft, supportive shoe gear 4. Patient to report any pedal injuries to medical professional immediately. 5. Follow up 3 months. Patient/POA to call should there be a concern in the interim.

## 2018-03-29 ENCOUNTER — Ambulatory Visit: Payer: Medicare Other | Admitting: Podiatry

## 2018-07-04 ENCOUNTER — Telehealth: Payer: Self-pay | Admitting: *Deleted

## 2018-07-04 NOTE — Telephone Encounter (Signed)
Unable to leave a message, no voicemail.  

## 2018-08-06 NOTE — Progress Notes (Deleted)
Cardiology Office Note   Date:  08/06/2018   ID:  Jamie Mueller, DOB [birth year], MRN [id number]  PCP:  Renford DillsPolite, Ronald, MD  Cardiologist:   No primary care provider on file.   No chief complaint on file.   History of Present Illness: Jamie Mueller is a 85 y.o. female who presents for follow up of atrial fib.  She ws in the hospital last year for that.  She presented with nausea, dizziness, chest pain.  She was seen in the ED by neurology and we were called secondary to pain and possible atrial fib.  She had minimally elevated troponin.  Echo was 60 - 65%.  She was started on Eliquis.  CT and MRI of the brain were unremarkable.  After the last visit I ordered an event recorder.  I reviewed this and there was possible atrial fib although artifact precluded adequate analysis.  She did not want an implanted loop.  ***  She came back to discuss this and we planned a loop recorder.  However, this was cancelled by the family apparently secondary to cost.    She feels well.  She walks routinely to the mailbox.  She goes to church.  She was at the dinner.  She goes grocery shopping.  With this she denies any cardiovascular symptoms.  She does not notice any palpitations, presyncope or syncope.  She has no PND or orthopnea.  She has none of the presenting symptoms that she had to include chest pain.     Past Medical History:  Diagnosis Date  . Diabetes mellitus without complication (HCC)   . Hypercholesteremia   . Hypertension    benign  . Memory loss   . PAF (paroxysmal atrial fibrillation) (HCC)   . Vitamin B 12 deficiency     Past Surgical History:  Procedure Laterality Date  . ABDOMINAL HYSTERECTOMY    . CATARACT EXTRACTION Left 03/2015     Current Outpatient Medications  Medication Sig Dispense Refill  . apixaban (ELIQUIS) 2.5 MG TABS tablet Take 1 tablet (2.5 mg total) by mouth 2 (two) times daily. 60 tablet 0  . atenolol (TENORMIN) 50 MG tablet TK 1 T PO QD  4  .  chlorthalidone (HYGROTON) 25 MG tablet   2  . enalapril (VASOTEC) 10 MG tablet Take 1 tablet (10 mg total) by mouth 2 (two) times daily. 60 tablet 11  . gabapentin (NEURONTIN) 300 MG capsule Take 300 mg by mouth 3 (three) times daily.    . metFORMIN (GLUCOPHAGE) 500 MG tablet TK 2 TS PO  BID  3  . pravastatin (PRAVACHOL) 40 MG tablet TK 1 T PO QD  4  . vitamin B-12 2000 MCG tablet Take 1 tablet (2,000 mcg total) by mouth daily. 60 tablet 0   No current facility-administered medications for this visit.     Allergies:   Patient has no known allergies.    ROS:  Please see the history of present illness.   Otherwise, review of systems are positive for ***.   All other systems are reviewed and negative.    PHYSICAL EXAM: VS:  There were no vitals taken for this visit. , BMI There is no height or weight on file to calculate BMI.  GENERAL:  Well appearing NECK:  No jugular venous distention, waveform within normal limits, carotid upstroke brisk and symmetric, no bruits, no thyromegaly LUNGS:  Clear to auscultation bilaterally CHEST:  Unremarkable HEART:  PMI not displaced or sustained,S1 and  S2 within normal limits, no S3, no S4, no clicks, no rubs, *** murmurs ABD:  Flat, positive bowel sounds normal in frequency in pitch, no bruits, no rebound, no guarding, no midline pulsatile mass, no hepatomegaly, no splenomegaly EXT:  2 plus pulses throughout, no edema, no cyanosis no clubbing     ***GENERAL:  Well appearing NECK:  No jugular venous distention, waveform within normal limits, carotid upstroke brisk and symmetric, no bruits, no thyromegaly LUNGS:  Clear to auscultation bilaterally CHEST:  Unremarkable HEART:  PMI not displaced or sustained,S1 and S2 within normal limits, no S3, no S4, no clicks, no rubs, no murmurs ABD:  Flat, positive bowel sounds normal in frequency in pitch, no bruits, no rebound, no guarding, no midline pulsatile mass, no hepatomegaly, no splenomegaly EXT:  2 plus  pulses throughout, no edema, no cyanosis no clubbing    EKG:  EKG is ***ordered today. ***   Recent Labs: No results found for requested labs within last 8760 hours.   Lab Results  Component Value Date   HGBA1C 6.0 (H) 03/30/2017    Lipid Panel    Component Value Date/Time   CHOL  06/02/2009 0435    169        ATP III CLASSIFICATION:  <200     mg/dL   Desirable  161-096200-239  mg/dL   Borderline High  >=045>=240    mg/dL   High          TRIG 89 06/02/2009 0435   HDL 55 06/02/2009 0435   CHOLHDL 3.1 06/02/2009 0435   VLDL 18 06/02/2009 0435   LDLCALC  06/02/2009 0435    96        Total Cholesterol/HDL:CHD Risk Coronary Heart Disease Risk Table                     Men   Women  1/2 Average Risk   3.4   3.3  Average Risk       5.0   4.4  2 X Average Risk   9.6   7.1  3 X Average Risk  23.4   11.0        Use the calculated Patient Ratio above and the CHD Risk Table to determine the patient's CHD Risk.        ATP III CLASSIFICATION (LDL):  <100     mg/dL   Optimal  [id number]  mg/dL   Near or Above                    Optimal  130-159  mg/dL   Borderline  [id number]  mg/dL   High  >956>190     mg/dL   Very High     Lab Results  Component Value Date   CREATININE 1.31 (H) 03/31/2017    Wt Readings from Last 3 Encounters:  08/08/17 130 lb (59 kg)  05/27/17 135 lb 3.2 oz (61.3 kg)  04/11/17 133 lb (60.3 kg)    Lab Results  Component Value Date   HGBA1C 6.0 (H) 03/30/2017    Other studies Reviewed: Additional studies/ records that were reviewed today include: *** Review of the above records demonstrates:   ***   ASSESSMENT AND PLAN:  ATRIAL FIB:    ***  We had long discussions about this.  She does appear to have atrial fibrillation though I could not absolutely confirm this because of artifact.  Given the preponderance of evidence after long discussion the patient family and  I agree to continue with the current dose of Eliquis.  At this point they do not want a loop monitor  without knowing exactly how much it would cost.  Of note she does have a mild anemia.  This has been stable.  There is been no evidence of GI bleeding or blood loss.  They are going to see next month and have asked him to ask for a CBC.  HTN:    The blood pressure is *** at target. No change in medications is indicated. We will continue with therapeutic lifestyle changes (TLC).  DM:    A1c is 6.0.  This is followed per Seward Carol, MD   DYSLIPIDEMIA:    LDL was 96.  She agrees to take pravastatin but has not wanted to be switched to another medicine.  *** No change in therapy.    CKD III:   Creatinine was 1.31.   ***  She remains on the meds as listed.    Current medicines are reviewed at length with the patient today.  The patient does not have concerns regarding medicines.  The following changes have been made:  ***  Labs/ tests ordered today include:   ***  No orders of the defined types were placed in this encounter.    Disposition:   FU with me in *** months.   Signed, Minus Breeding, MD  08/06/2018 3:19 PM    [facility]

## 2018-08-07 ENCOUNTER — Telehealth: Payer: Self-pay

## 2018-08-07 NOTE — Telephone Encounter (Signed)
940AM [facility]  S/W Thomasenia Bottoms (daughter) (DPR) WILL ARRIVE @930AM   COVID-19 Pre-Screening Questions:  . In the past 7 to 10 days have you had a cough,  shortness of breath, headache, congestion, fever (100 or greater) body aches, chills, sore throat, or sudden loss of taste or sense of smell? NO . Have you been around anyone with known Covid 19. NO . Have you been around anyone who is awaiting Covid 19 test results in the past 7 to 10 days? NO . Have you been around anyone who has been exposed to Covid 19, or has mentioned symptoms of Covid 19 within the past 7 to 10 days? NO

## 2018-08-08 ENCOUNTER — Ambulatory Visit: Payer: Medicare Other | Admitting: Cardiology

## 2018-08-08 NOTE — Telephone Encounter (Signed)
PT HAD DEATH IN THE FAMILY CALL BACK END OF THE WEEK TO RESCHEDULE APPT

## 2018-10-25 DIAGNOSIS — E785 Hyperlipidemia, unspecified: Secondary | ICD-10-CM | POA: Insufficient documentation

## 2018-10-25 NOTE — Progress Notes (Signed)
Virtual Visit via Telephone Note   This visit type was conducted due to national recommendations for restrictions regarding the COVID-19 Pandemic (e.g. social distancing) in an effort to limit this patient's exposure and mitigate transmission in our community.  Due to her co-morbid illnesses, this patient is at least at moderate risk for complications without adequate follow up.  This format is felt to be most appropriate for this patient at this time.  The patient did not have access to video technology/had technical difficulties with video requiring transitioning to audio format only (telephone).  All issues noted in this document were discussed and addressed.  No physical exam could be performed with this format.  Please refer to the patient's chart for her  consent to telehealth for [facility].   Date:  10/26/2018   ID:  Jamie, Mueller 1930/06/25, MRN [id number]  Patient Location: Home Provider Location: Home  PCP:  Renford Dills, MD  Cardiologist:  Rollene Rotunda, MD  Electrophysiologist:  None   Evaluation Performed:  Follow-Up Visit  Chief Complaint:  Atrial fib  History of Present Illness:    Jamie Mueller is a 85 y.o. female who presents for follow up of atrial fib.  She was in the hospital earlier this year for that.  She presented with nausea, dizziness, chest pain.  She was seen in the ED by neurology and we were called secondary to pain and possible atrial fib.  She had minimally elevated troponin.  Echo was 60 - 65%.  She was started on Eliquis.  CT and MRI of the brain were unremarkable.  An event recorder demonstrated possible atrial fib although artifact precluded adequate analysis.  She came back to discuss this and we planned a loop recorder.  However, this was cancelled by the family apparently secondary to cost.    Since I last saw her she does not have any new cardiac complaints.  It sounds like she stays home.  We went over her meds and detail.  It  does not look like she is actually taking apixaban.  She does not recall this.  She is not had any symptomatic tachypalpitations.  She denies any shortness of breath, PND or orthopnea.  She has had no chest pressure, neck or arm discomfort.  It sounds like she gets around slowly.  She is had no weight gain or edema.  The patient does not have symptoms concerning for COVID-19 infection (fever, chills, cough, or new shortness of breath).    Past Medical History:  Diagnosis Date  . Diabetes mellitus without complication (HCC)   . Hypercholesteremia   . Hypertension    benign  . Memory loss   . PAF (paroxysmal atrial fibrillation) (HCC)   . Vitamin B 12 deficiency    Past Surgical History:  Procedure Laterality Date  . ABDOMINAL HYSTERECTOMY    . CATARACT EXTRACTION Left 03/2015    Prior to Admission medications   Medication Sig Start Date End Date Taking? Authorizing Provider  apixaban (ELIQUIS) 2.5 MG TABS tablet Take 1 tablet (2.5 mg total) by mouth 2 (two) times daily. 03/31/17  Yes Sheikh, Omair Latif, DO  atenolol (TENORMIN) 50 MG tablet TK 1 T PO QD 11/05/14  Yes [provider]  chlorthalidone (HYGROTON) 25 MG tablet  12/14/17  Yes [provider]  enalapril (VASOTEC) 10 MG tablet Take 1 tablet (10 mg total) by mouth 2 (two) times daily. 05/27/17  Yes Rollene Rotunda, MD  gabapentin (NEURONTIN) 300 MG capsule  Take 300 mg by mouth 3 (three) times daily.   Yes [provider]  metFORMIN (GLUCOPHAGE) 500 MG tablet TK 2 TS PO  BID 10/16/14  Yes [provider]  pravastatin (PRAVACHOL) 40 MG tablet TK 1 T PO QD 11/16/14  Yes [provider]  vitamin B-12 2000 MCG tablet Take 1 tablet (2,000 mcg total) by mouth daily. 04/07/17  Yes Marguerita MerlesSheikh, Omair Latif, DO     Allergies:   Patient has no known allergies.   Social History   Tobacco Use  . Smoking status: Never Smoker  . Smokeless tobacco: Never Used  Substance Use Topics  . Alcohol use: No     Alcohol/week: 0.0 standard drinks  . Drug use: No     Family Hx: The patient's family history includes Cancer in her father.  ROS:   Please see the history of present illness.    All other systems reviewed and are negative.   Prior CV studies:   The following studies were reviewed today:    Labs/Other Tests and Data Reviewed:    EKG:  No ECG reviewed.  Recent Labs: No results found for requested labs within last 8760 hours.   Recent Lipid Panel Lab Results  Component Value Date/Time   CHOL  06/02/2009 04:35 AM    169        ATP III CLASSIFICATION:  <200     mg/dL   Desirable  161-096200-239  mg/dL   Borderline High  >=045>=240    mg/dL   High          TRIG 89 06/02/2009 04:35 AM   HDL 55 06/02/2009 04:35 AM   CHOLHDL 3.1 06/02/2009 04:35 AM   LDLCALC  06/02/2009 04:35 AM    96        Total Cholesterol/HDL:CHD Risk Coronary Heart Disease Risk Table                     Men   Women  1/2 Average Risk   3.4   3.3  Average Risk       5.0   4.4  2 X Average Risk   9.6   7.1  3 X Average Risk  23.4   11.0        Use the calculated Patient Ratio above and the CHD Risk Table to determine the patient's CHD Risk.        ATP III CLASSIFICATION (LDL):  <100     mg/dL   Optimal  409-811100-129  mg/dL   Near or Above                    Optimal  130-159  mg/dL   Borderline  [id number]  mg/dL   High  >956>190     mg/dL   Very High    Wt Readings from Last 3 Encounters:  08/08/17 130 lb (59 kg)  05/27/17 135 lb 3.2 oz (61.3 kg)  04/11/17 133 lb (60.3 kg)     Objective:    Vital Signs:  Ht 5\' 2"  (1.575 m)   BMI 23.78 kg/m    VITAL SIGNS:  reviewed She has no BP cuff  ASSESSMENT & PLAN:    ATRIAL FIB:     I had a long discussion with the patient.  I went to her meds bottle by bottle with her and I do not think she is taking Eliquis.  We are contacting her pharmacy.  We are getting a note from  her primary provider.  She reluctantly agrees to wear a monitor which I would do for about  3 days to see if there is any fibrillation at all.  If so she has no contraindication and I think she should be on this.  If there is no documented atrial fibrillation and I think it would be reasonable to keep her off of this medicine.   HTN:    The blood pressure is not clear.  I will look at her primary provider note.  I would encourage her family to get a blood pressure monitor.  It looks like she is only taking her ACE inhibitor once daily and I would like to follow this with blood pressure readings prior to making an adjustment.    DM:    A1c is 6.1 in Dec.  She does take her metformin and I confirmed this.  This is followed per Seward Carol, MD   DYSLIPIDEMIA:    LDL was 70 at her most recent reading.  No change in therapy.   CKD III:   Creatinine was 1.71 which was up from 1.36 previously.  Follow closely per Seward Carol, MD  COVID-19 Education: The signs and symptoms of COVID-19 were discussed with the patient and how to seek care for testing (follow up with PCP or arrange E-visit).  The importance of social distancing was discussed today.  I did encourage her to get a flu shot tonight not sure whether she will comply with this.  Time:   Today, I have spent 30 minutes with the patient with telehealth technology discussing the above problems.     Medication Adjustments/Labs and Tests Ordered: Current medicines are reviewed at length with the patient today.  Concerns regarding medicines are outlined above.   Tests Ordered: Orders Placed This Encounter  Procedures  . LONG TERM MONITOR (3-14 DAYS)    Medication Changes: Meds ordered this encounter  Medications  . enalapril (VASOTEC) 10 MG tablet    Sig: Take 1 tablet (10 mg total) by mouth once for 1 dose.    Dispense:  90 tablet    Refill:  3  . chlorthalidone (HYGROTON) 25 MG tablet    Sig: Take 1/2 tablet daily.    Dispense:  45 tablet    Refill:  3  . DISCONTD: apixaban (ELIQUIS) 2.5 MG TABS tablet    Sig: Take 1  tablet (2.5 mg total) by mouth 2 (two) times daily.    Dispense:  90 tablet    Refill:  3  . apixaban (ELIQUIS) 2.5 MG TABS tablet    Sig: Take 1 tablet (2.5 mg total) by mouth 2 (two) times daily.    Dispense:  90 tablet    Refill:  3    Follow Up:  Virtual Visit in six months  Signed, Minus Breeding, MD  10/26/2018 10:29 AM    [place]

## 2018-10-26 ENCOUNTER — Encounter: Payer: Self-pay | Admitting: Cardiology

## 2018-10-26 ENCOUNTER — Telehealth (INDEPENDENT_AMBULATORY_CARE_PROVIDER_SITE_OTHER): Payer: Medicare Other | Admitting: Cardiology

## 2018-10-26 VITALS — Ht 62.0 in

## 2018-10-26 DIAGNOSIS — I4891 Unspecified atrial fibrillation: Secondary | ICD-10-CM

## 2018-10-26 DIAGNOSIS — N1832 Chronic kidney disease, stage 3b: Secondary | ICD-10-CM

## 2018-10-26 DIAGNOSIS — I10 Essential (primary) hypertension: Secondary | ICD-10-CM

## 2018-10-26 DIAGNOSIS — E785 Hyperlipidemia, unspecified: Secondary | ICD-10-CM

## 2018-10-26 DIAGNOSIS — E118 Type 2 diabetes mellitus with unspecified complications: Secondary | ICD-10-CM

## 2018-10-26 MED ORDER — APIXABAN 2.5 MG PO TABS: 2.5000 mg | ORAL_TABLET | Freq: Two times a day (BID) | ORAL | 3 refills | Status: DC

## 2018-10-26 MED ORDER — ENALAPRIL MALEATE 10 MG PO TABS: 10.0000 mg | ORAL_TABLET | Freq: Once | ORAL | 3 refills | Status: AC

## 2018-10-26 MED ORDER — CHLORTHALIDONE 25 MG PO TABS: ORAL_TABLET | ORAL | 3 refills | Status: AC

## 2018-10-26 NOTE — Patient Instructions (Addendum)
Medication Instructions:  Your physician recommends that you continue on your current medications as directed. Please refer to the Current Medication list given to you today.  If you need a refill on your cardiac medications before your next appointment, please call your pharmacy.   Lab work: NONE  Testing/Procedures: Your physician has recommended that you wear a 3 event monitor. Event monitors are medical devices that record the heart's electrical activity. Doctors most often [place] these monitors to diagnose arrhythmias. Arrhythmias are problems with the speed or rhythm of the heartbeat. The monitor is a small, portable device. You can wear one while you do your normal daily activities. This is usually used to diagnose what is causing palpitations/syncope (passing out). You will receive a call and the monitor will be mailed.    Follow-Up: At [facility], you and your health needs are our priority.  As part of our continuing mission to provide you with exceptional heart care, we have created designated Provider Care Teams.  These Care Teams include your primary Cardiologist (physician) and Advanced Practice Providers (APPs -  Physician Assistants and Nurse Practitioners) who all work together to provide you with the care you need, when you need it. You will need a follow up appointment in 6 months.  Please call our office 2 months in advance to schedule this appointment.  You may see Minus Breeding, MD or one of the following Advanced Practice Providers on your designated Care Team:   Rosaria Ferries, PA-C Jory Sims, DNP, ANP

## 2018-10-31 ENCOUNTER — Telehealth: Payer: Self-pay

## 2018-10-31 NOTE — Telephone Encounter (Signed)
[id number] 

## 2018-10-31 NOTE — Telephone Encounter (Signed)
-----   Message from Minus Breeding, MD sent at 10/27/2018  5:44 PM EDT ----- I reviewed the records from her PCP and she is still supposed to be taking 2.5 mg Eliquis daily.  Please call.

## 2018-11-01 ENCOUNTER — Telehealth: Payer: Self-pay

## 2018-11-01 NOTE — Telephone Encounter (Signed)
Spoke to pt, went over monitor instructions. Verified address. Advised pt to have her Daughter call us if she has any questions. I called her several times but it went straight to VM. 3 day ZIO monitor ordered to be mailed to pt's home address.

## 2018-11-08 NOTE — Telephone Encounter (Signed)
Tried calling pt on both numbers listed. LM2CB.

## 2018-11-14 ENCOUNTER — Telehealth: Payer: Self-pay

## 2018-11-14 NOTE — Telephone Encounter (Signed)
Called both numbers listed. Voicemail not set up. Called both children per DPR. No voicemail.

## 2019-02-01 ENCOUNTER — Telehealth: Payer: Self-pay | Admitting: *Deleted

## 2019-02-01 NOTE — Telephone Encounter (Signed)
-----   Message from Ernst Bowler sent at 01/24/2019  5:03 PM EST ----- [employer] monitor status"lost" which means ZIO patch monitor was never returned to [employer].  [employer] generally attempts to reach out to patch 2-3 times to have monitor returned.  ----- Message ----- From: Burnell Blanks, LPN Sent: 02/25/1186  10:47 PM EST To: Burnell Blanks, LPN, Ernst Bowler, #  Hello ladies Just f/u on pts monitor Thx [place]

## 2019-05-24 ENCOUNTER — Telehealth: Payer: Self-pay | Admitting: *Deleted

## 2019-05-24 NOTE — Telephone Encounter (Signed)
A message was left, re: her follow up visit. 

## 2019-06-04 DIAGNOSIS — Z7189 Other specified counseling: Secondary | ICD-10-CM | POA: Insufficient documentation

## 2019-06-04 NOTE — Progress Notes (Deleted)
Cardiology Office Note   Date:  06/04/2019   ID:  Jamie Mueller, Jamie Mueller 1930-11-23, MRN [id number]  PCP:  Renford Dills, MD  Cardiologist:   Rollene Rotunda, MD Referring:  ***  No chief complaint on file.     History of Present Illness: Jamie Mueller is a 86 y.o. female who presents for follow up of atrial fib.  She was in the hospital in 2019.   She presented with nausea, dizziness, chest pain.  She was seen in the ED by neurology and we were called secondary to pain and possible atrial fib.  She had minimally elevated troponin.  Echo was 60 - 65%.  She was started on Eliquis.  CT and MRI of the brain were unremarkable.  An event recorder demonstrated possible atrial fib although artifact precluded adequate analysis.  She came back to discuss this and we planned a loop recorder.  However, this was cancelled by the family apparently secondary to cost.   She did have monitor and she was found to have PAF.  ***    Since I last saw her ***   ***she does not have any new cardiac complaints.  It sounds like she stays home.  We went over her meds and detail.  It does not look like she is actually taking apixaban.  She does not recall this.  She is not had any symptomatic tachypalpitations.  She denies any shortness of breath, PND or orthopnea.  She has had no chest pressure, neck or arm discomfort.  It sounds like she gets around slowly.  She is had no weight gain or edema.    Past Medical History:  Diagnosis Date  . Diabetes mellitus without complication (HCC)   . Hypercholesteremia   . Hypertension    benign  . Memory loss   . PAF (paroxysmal atrial fibrillation) (HCC)   . Vitamin B 12 deficiency     Past Surgical History:  Procedure Laterality Date  . ABDOMINAL HYSTERECTOMY    . CATARACT EXTRACTION Left 03/2015     Current Outpatient Medications  Medication Sig Dispense Refill  . apixaban (ELIQUIS) 2.5 MG TABS tablet Take 1 tablet (2.5 mg total) by mouth 2 (two)  times daily. 90 tablet 3  . atenolol (TENORMIN) 50 MG tablet TK 1 T PO QD  4  . chlorthalidone (HYGROTON) 25 MG tablet Take 1/2 tablet daily. 45 tablet 3  . enalapril (VASOTEC) 10 MG tablet Take 1 tablet (10 mg total) by mouth once for 1 dose. 90 tablet 3  . gabapentin (NEURONTIN) 300 MG capsule Take 300 mg by mouth 3 (three) times daily.    . metFORMIN (GLUCOPHAGE) 500 MG tablet TK 2 TS PO  BID  3  . pravastatin (PRAVACHOL) 40 MG tablet TK 1 T PO QD  4  . vitamin B-12 2000 MCG tablet Take 1 tablet (2,000 mcg total) by mouth daily. 60 tablet 0   No current facility-administered medications for this visit.    Allergies:   Patient has no known allergies.    ROS:  Please see the history of present illness.   Otherwise, review of systems are positive for {NONE DEFAULTED:18576::"none"}.   All other systems are reviewed and negative.    PHYSICAL EXAM: VS:  There were no vitals taken for this visit. , BMI There is no height or weight on file to calculate BMI. GENERAL:  Well appearing NECK:  No jugular venous distention, waveform within normal limits, carotid upstroke brisk  and symmetric, no bruits, no thyromegaly LUNGS:  Clear to auscultation bilaterally CHEST:  Unremarkable HEART:  PMI not displaced or sustained,S1 and S2 within normal limits, no S3, no S4, no clicks, no rubs, *** murmurs ABD:  Flat, positive bowel sounds normal in frequency in pitch, no bruits, no rebound, no guarding, no midline pulsatile mass, no hepatomegaly, no splenomegaly EXT:  2 plus pulses throughout, no edema, no cyanosis no clubbing   ***GENERAL:  Well appearing HEENT:  Pupils equal round and reactive, fundi not visualized, oral mucosa unremarkable NECK:  No jugular venous distention, waveform within normal limits, carotid upstroke brisk and symmetric, no bruits, no thyromegaly LYMPHATICS:  No cervical, inguinal adenopathy LUNGS:  Clear to auscultation bilaterally BACK:  No CVA tenderness CHEST:   Unremarkable HEART:  PMI not displaced or sustained,S1 and S2 within normal limits, no S3, no S4, no clicks, no rubs, *** murmurs ABD:  Flat, positive bowel sounds normal in frequency in pitch, no bruits, no rebound, no guarding, no midline pulsatile mass, no hepatomegaly, no splenomegaly EXT:  2 plus pulses throughout, no edema, no cyanosis no clubbing SKIN:  No rashes no nodules NEURO:  Cranial nerves II through XII grossly intact, motor grossly intact throughout PSYCH:  Cognitively intact, oriented to person place and time    EKG:  EKG {ACTION; IS/IS SNK:[id number]} ordered today. The ekg ordered today demonstrates ***   Recent Labs: No results found for requested labs within last 8760 hours.    Lipid Panel    Component Value Date/Time   CHOL  06/02/2009 0435    169        ATP III CLASSIFICATION:  <200     mg/dL   Desirable  193-790  mg/dL   Borderline High  >=240    mg/dL   High          TRIG 89 06/02/2009 0435   HDL 55 06/02/2009 0435   CHOLHDL 3.1 06/02/2009 0435   VLDL 18 06/02/2009 0435   LDLCALC  06/02/2009 0435    96        Total Cholesterol/HDL:CHD Risk Coronary Heart Disease Risk Table                     Men   Women  1/2 Average Risk   3.4   3.3  Average Risk       5.0   4.4  2 X Average Risk   9.6   7.1  3 X Average Risk  23.4   11.0        Use the calculated Patient Ratio above and the CHD Risk Table to determine the patient's CHD Risk.        ATP III CLASSIFICATION (LDL):  <100     mg/dL   Optimal  973-532  mg/dL   Near or Above                    Optimal  130-159  mg/dL   Borderline  992-426  mg/dL   High  >834     mg/dL   Very High      Wt Readings from Last 3 Encounters:  08/08/17 130 lb (59 kg)  05/27/17 135 lb 3.2 oz (61.3 kg)  04/11/17 133 lb (60.3 kg)      Other studies Reviewed: Additional studies/ records that were reviewed today include: ***. Review of the above records demonstrates:  Please see elsewhere in the note.   ***   ASSESSMENT  AND PLAN:  ATRIAL FIB:***   I had a long discussion with the patient.  I went to her meds bottle by bottle with her and I do not think she is taking Eliquis.  We are contacting her pharmacy.  We are getting a note from her primary provider.  She reluctantly agrees to wear a monitor which I would do for about 3 days to see if there is any fibrillation at all.  If so she has no contraindication and I think she should be on this.  If there is no documented atrial fibrillation and I think it would be reasonable to keep her off of this medicine.   HTN:The blood pressure is *** not clear.  I will look at her primary provider note.  I would encourage her family to get a blood pressure monitor.  It looks like she is only taking her ACE inhibitor once daily and I would like to follow this with blood pressure readings prior to making an adjustment.    DM:A1c was ***  s 6.1 in Dec.  She does take her metformin and I confirmed this.  This is followed per Seward Carol, MD  [facility] was *** 70 at her most recent reading.  No change in therapy.   CKD NUU:VOZDGUYQIH was ***  1.71 which was up from 1.36 previously.  Follow closely per Seward Carol, MD  COVID EDUCATION:  ***  The signs and symptoms of COVID-19 were discussed with the patient and how to seek care for testing (follow up with PCP or arrange E-visit).  The importance of social distancing was discussed today.  I did encourage her to get a flu shot tonight not sure whether she will comply with this.   Current medicines are reviewed at length with the patient today.  The patient {ACTIONS; HAS/DOES NOT HAVE:19233} concerns regarding medicines.  The following changes have been made:  {PLAN; NO CHANGE:13088:s}  Labs/ tests ordered today include: *** No orders of the defined types were placed in this encounter.    Disposition:   FU with ***    Signed, Minus Breeding, MD  06/04/2019 8:29 PM     [facility]

## 2019-06-05 ENCOUNTER — Ambulatory Visit: Payer: Medicare Other | Admitting: Cardiology

## 2019-07-11 ENCOUNTER — Encounter: Payer: Self-pay | Admitting: Cardiology

## 2019-10-05 ENCOUNTER — Encounter: Payer: Self-pay | Admitting: Podiatry

## 2019-10-05 ENCOUNTER — Ambulatory Visit: Payer: Medicare Other | Admitting: Podiatry

## 2019-10-05 ENCOUNTER — Other Ambulatory Visit: Payer: Self-pay

## 2019-10-05 DIAGNOSIS — L84 Corns and callosities: Secondary | ICD-10-CM

## 2019-10-05 DIAGNOSIS — E1151 Type 2 diabetes mellitus with diabetic peripheral angiopathy without gangrene: Secondary | ICD-10-CM

## 2019-10-05 DIAGNOSIS — B351 Tinea unguium: Secondary | ICD-10-CM

## 2019-10-05 DIAGNOSIS — M79674 Pain in right toe(s): Secondary | ICD-10-CM | POA: Diagnosis not present

## 2019-10-05 DIAGNOSIS — M79675 Pain in left toe(s): Secondary | ICD-10-CM | POA: Diagnosis not present

## 2019-10-05 NOTE — Patient Instructions (Signed)
EPSOM SALT FOOT SOAK INSTRUCTIONS  *IF YOU HAVE BEEN PRESCRIBED ANTIBIOTICS, TAKE AS INSTRUCTED UNTIL ALL ARE GONE*  Shopping List:  A. Plain epsom salt (not scented) B. Neosporin Cream/Ointment or Bacitracin Cream/Ointment (or prescribed antiobiotic drops/cream/ointment) C. 1-inch fabric band-aids  1.  Place 1/4 cup of epsom salts in 2 quarts of warm tap water. IF YOU ARE DIABETIC, OR HAVE NEUROPATHY, CHECK THE TEMPERATURE OF THE WATER WITH YOUR ELBOW.  2.  Submerge your foot/feet in the solution and soak for 10-15 minutes.      3.  Next, remove your foot/feet from solution, blot dry the affected area.    4.  Apply light amount of antibiotic cream/ointment and cover with fabric band-aid .  5.  This soak should be done once a day for 7 days.   6.  Monitor for any signs/symptoms of infection such as redness, swelling, odor, drainage, increased pain, or non-healing of digit.   7.  Please do not hesitate to call the office and speak to a Nurse or Doctor if you have questions.   8.  If you experience fever, chills, nightsweats, nausea or vomiting with worsening of digit, please go to the emergency room.   

## 2019-10-07 NOTE — Progress Notes (Signed)
Subjective:  Patient ID: Jamie Mueller, female    DOB: 1930-05-01,  MRN: [id number]  86 y.o. female presents with at risk foot care. Pt has h/o NIDDM with PAD and painful corn(s) left 5th toe , callus(es) right foot and painful mycotic nails.  Pain interferes with ambulation. Aggravating factors include wearing enclosed shoe gear. Painful toenails interfere with ambulation. Aggravating factors include wearing enclosed shoe gear. Pain is relieved with periodic professional debridement. Painful corns and calluses are aggravated when weightbearing with and without shoegear. Pain is relieved with periodic professional debridement..    Review of Systems: Negative except as noted in the HPI.  Past Medical History:  Diagnosis Date  . Diabetes mellitus without complication (HCC)   . Hypercholesteremia   . Hypertension    benign  . Memory loss   . PAF (paroxysmal atrial fibrillation) (HCC)   . Vitamin B 12 deficiency    Past Surgical History:  Procedure Laterality Date  . ABDOMINAL HYSTERECTOMY    . CATARACT EXTRACTION Left 03/2015   Patient Active Problem List   Diagnosis Date Noted  . Educated about COVID-19 virus infection 06/04/2019  . Dyslipidemia 10/25/2018  . Medication management 05/27/2017  . PAF (paroxysmal atrial fibrillation) (HCC) 05/27/2017  . CKD (chronic kidney disease), stage III 05/27/2017  . Cardiac arrhythmia 04/11/2017  . Palpitation 04/11/2017  . Chest discomfort 03/30/2017  . Essential hypertension 03/30/2017  . Type 2 diabetes mellitus without complication (HCC) 03/30/2017  . Hyperlipidemia 03/30/2017    Current Outpatient Medications:  .  apixaban (ELIQUIS) 2.5 MG TABS tablet, Take 1 tablet (2.5 mg total) by mouth 2 (two) times daily., Disp: 90 tablet, Rfl: 3 .  atenolol (TENORMIN) 50 MG tablet, TK 1 T PO QD, Disp: , Rfl: 4 .  chlorthalidone (HYGROTON) 25 MG tablet, Take 1/2 tablet daily., Disp: 45 tablet, Rfl: 3 .  enalapril (VASOTEC) 10 MG tablet, Take  1 tablet (10 mg total) by mouth once for 1 dose., Disp: 90 tablet, Rfl: 3 .  gabapentin (NEURONTIN) 300 MG capsule, Take 300 mg by mouth 3 (three) times daily., Disp: , Rfl:  .  metFORMIN (GLUCOPHAGE) 500 MG tablet, TK 2 TS PO  BID, Disp: , Rfl: 3 .  pravastatin (PRAVACHOL) 40 MG tablet, TK 1 T PO QD, Disp: , Rfl: 4 .  vitamin B-12 2000 MCG tablet, Take 1 tablet (2,000 mcg total) by mouth daily., Disp: 60 tablet, Rfl: 0 No Known Allergies Social History   Tobacco Use  Smoking Status Never Smoker  Smokeless Tobacco Never Used   Objective:  There were no vitals filed for this visit. Constitutional Patient is a pleasant 86 y.o. African American female in NAD.Marland Kitchen AAO x 3.  Vascular Capillary fill time to digits <3 seconds b/l lower extremities. Palpable DP pulse(s) b/l lower extremities Nonpalpable PT pulse(s) b/l lower extremities. Pedal hair absent. Lower extremity skin temperature gradient within normal limits. No pain with calf compression b/l. No ischemia or gangrene noted b/l lower extremities. No cyanosis or clubbing noted.  Neurologic Normal speech. Oriented to person, place, and time. Protective sensation diminished with 10g monofilament b/l. Vibratory sensation diminished b/l. Proprioception intact bilaterally.  Dermatologic Pedal skin with normal turgor, texture and tone bilaterally. No open wounds bilaterally. No interdigital macerations bilaterally. Toenails 1-5 b/l elongated, discolored, dystrophic, thickened, crumbly with subungual debris and tenderness to dorsal palpation. Hyperkeratotic lesion(s) L 5th toe, submet head 5 left foot and submet head 5 right foot.  No erythema, no edema, no drainage, no  flocculence.  Orthopedic: Normal muscle strength 5/5 to all lower extremity muscle groups bilaterally. No pain crepitus or joint limitation noted with ROM b/l. Hallux valgus with bunion deformity noted b/l lower extremities. Hammertoes noted to the 2-5 bilaterally.   Assessment:   1.  Pain due to onychomycosis of toenails of both feet   2. Corns and callosities   3. Type II diabetes mellitus with peripheral circulatory disorder [facility])    Plan:  Patient was evaluated and treated and all questions answered.  Onychomycosis with pain -Nails palliatively debridement as below. -Educated on self-care  Procedure: Nail Debridement Rationale: Pain Type of Debridement: manual, sharp debridement. Instrumentation: Nail nipper, rotary burr. Number of Nails: 10  -Examined patient. -No new findings. No new orders. -Continue diabetic foot care principles. -Toenails 1-5 b/l were debrided in length and girth with sterile nail nippers and dremel without iatrogenic bleeding.  -Corn(s) L 5th toe and callus(es) submet head 5 left foot and submet head 5 right foot were pared utilizing sterile scalpel blade without incident. Total number debrided =3. -Patient to report any pedal injuries to medical professional immediately. -Patient to continue soft, supportive shoe gear daily. -Patient/POA to call should there be question/concern in the interim.  Return in about 3 months (around 01/04/2020) for diabetic foot care, diabetic corn(s)/callus(es).  Freddie Breech, DPM

## 2019-10-18 ENCOUNTER — Other Ambulatory Visit: Payer: Self-pay

## 2019-10-18 ENCOUNTER — Ambulatory Visit: Payer: Medicare Other | Admitting: Orthotics

## 2019-10-18 DIAGNOSIS — E1151 Type 2 diabetes mellitus with diabetic peripheral angiopathy without gangrene: Secondary | ICD-10-CM

## 2019-10-18 DIAGNOSIS — L84 Corns and callosities: Secondary | ICD-10-CM

## 2019-10-18 NOTE — Progress Notes (Signed)

## 2019-11-19 ENCOUNTER — Other Ambulatory Visit: Payer: Self-pay

## 2019-11-19 ENCOUNTER — Ambulatory Visit (INDEPENDENT_AMBULATORY_CARE_PROVIDER_SITE_OTHER): Payer: Medicare Other | Admitting: Orthotics

## 2019-11-19 DIAGNOSIS — L84 Corns and callosities: Secondary | ICD-10-CM

## 2019-11-19 DIAGNOSIS — M2042 Other hammer toe(s) (acquired), left foot: Secondary | ICD-10-CM | POA: Diagnosis not present

## 2019-11-19 DIAGNOSIS — M2041 Other hammer toe(s) (acquired), right foot: Secondary | ICD-10-CM | POA: Diagnosis not present

## 2019-11-19 DIAGNOSIS — E1151 Type 2 diabetes mellitus with diabetic peripheral angiopathy without gangrene: Secondary | ICD-10-CM

## 2019-11-27 DIAGNOSIS — E1122 Type 2 diabetes mellitus with diabetic chronic kidney disease: Secondary | ICD-10-CM | POA: Diagnosis not present

## 2019-11-27 DIAGNOSIS — Z7984 Long term (current) use of oral hypoglycemic drugs: Secondary | ICD-10-CM | POA: Diagnosis not present

## 2019-11-27 DIAGNOSIS — Z23 Encounter for immunization: Secondary | ICD-10-CM | POA: Diagnosis not present

## 2019-11-27 DIAGNOSIS — R413 Other amnesia: Secondary | ICD-10-CM | POA: Diagnosis not present

## 2019-11-27 DIAGNOSIS — E7800 Pure hypercholesterolemia, unspecified: Secondary | ICD-10-CM | POA: Diagnosis not present

## 2019-11-27 DIAGNOSIS — R410 Disorientation, unspecified: Secondary | ICD-10-CM | POA: Diagnosis not present

## 2019-11-27 DIAGNOSIS — I480 Paroxysmal atrial fibrillation: Secondary | ICD-10-CM | POA: Diagnosis not present

## 2019-12-03 NOTE — Progress Notes (Signed)

## 2019-12-31 ENCOUNTER — Ambulatory Visit: Payer: Medicare Other | Admitting: Podiatry

## 2020-01-04 ENCOUNTER — Other Ambulatory Visit: Payer: Self-pay

## 2020-01-04 ENCOUNTER — Ambulatory Visit (INDEPENDENT_AMBULATORY_CARE_PROVIDER_SITE_OTHER): Payer: Medicare HMO

## 2020-01-04 ENCOUNTER — Encounter: Payer: Self-pay | Admitting: Podiatry

## 2020-01-04 ENCOUNTER — Ambulatory Visit (INDEPENDENT_AMBULATORY_CARE_PROVIDER_SITE_OTHER): Payer: Medicare HMO | Admitting: Podiatry

## 2020-01-04 DIAGNOSIS — M2041 Other hammer toe(s) (acquired), right foot: Secondary | ICD-10-CM

## 2020-01-04 DIAGNOSIS — M79675 Pain in left toe(s): Secondary | ICD-10-CM | POA: Diagnosis not present

## 2020-01-04 DIAGNOSIS — M2012 Hallux valgus (acquired), left foot: Secondary | ICD-10-CM

## 2020-01-04 DIAGNOSIS — M2011 Hallux valgus (acquired), right foot: Secondary | ICD-10-CM

## 2020-01-04 DIAGNOSIS — E538 Deficiency of other specified B group vitamins: Secondary | ICD-10-CM | POA: Insufficient documentation

## 2020-01-04 DIAGNOSIS — M2042 Other hammer toe(s) (acquired), left foot: Secondary | ICD-10-CM

## 2020-01-04 DIAGNOSIS — E7800 Pure hypercholesterolemia, unspecified: Secondary | ICD-10-CM | POA: Insufficient documentation

## 2020-01-04 DIAGNOSIS — R413 Other amnesia: Secondary | ICD-10-CM | POA: Insufficient documentation

## 2020-01-04 DIAGNOSIS — L02611 Cutaneous abscess of right foot: Secondary | ICD-10-CM

## 2020-01-04 DIAGNOSIS — I739 Peripheral vascular disease, unspecified: Secondary | ICD-10-CM | POA: Insufficient documentation

## 2020-01-04 DIAGNOSIS — M79674 Pain in right toe(s): Secondary | ICD-10-CM | POA: Diagnosis not present

## 2020-01-04 DIAGNOSIS — G619 Inflammatory polyneuropathy, unspecified: Secondary | ICD-10-CM | POA: Insufficient documentation

## 2020-01-04 DIAGNOSIS — E1151 Type 2 diabetes mellitus with diabetic peripheral angiopathy without gangrene: Secondary | ICD-10-CM | POA: Diagnosis not present

## 2020-01-04 DIAGNOSIS — E119 Type 2 diabetes mellitus without complications: Secondary | ICD-10-CM | POA: Diagnosis not present

## 2020-01-04 DIAGNOSIS — B351 Tinea unguium: Secondary | ICD-10-CM

## 2020-01-04 DIAGNOSIS — E1121 Type 2 diabetes mellitus with diabetic nephropathy: Secondary | ICD-10-CM | POA: Insufficient documentation

## 2020-01-04 DIAGNOSIS — G622 Polyneuropathy due to other toxic agents: Secondary | ICD-10-CM | POA: Insufficient documentation

## 2020-01-04 NOTE — Progress Notes (Signed)
ANNUAL DIABETIC FOOT EXAM  Subjective: Jamie Mueller presents today for for annual diabetic foot examination and painful thick toenails that are difficult to trim. Pain interferes with ambulation. Aggravating factors include wearing enclosed shoe gear. Pain is relieved with periodic professional debridement.   Patent's son, Jamie Mueller, is present during today's visit. Jamie Mueller did call his sister (who usually accompanies Jamie Mueller to her appointments) and sister states it has been several months since her last pedicure and it was just a polish with no trimming of toenails).  Patient relates >10 year h/o diabetes.  Patient denies h/o foot wounds.  Patient denies symptoms of foot numbness.  Patient denies symptoms of foot tingling.  Patient denies symptoms of burning in feet.  Patient does not check blood sugars at home.  Jamie Dills, MD is patient's PCP. Last visit was 11/27/2019.  Past Medical History:  Diagnosis Date  . Diabetes mellitus without complication (HCC)   . Hypercholesteremia   . Hypertension    benign  . Memory loss   . PAF (paroxysmal atrial fibrillation) (HCC)   . Vitamin B 12 deficiency    Patient Active Problem List   Diagnosis Date Noted  . Diabetic renal disease (HCC) 01/04/2020  . Inflammatory and toxic neuropathy (HCC) 01/04/2020  . Memory loss 01/04/2020  . Peripheral vascular disease (HCC) 01/04/2020  . Pure hypercholesterolemia 01/04/2020  . Vitamin B12 deficiency (non anemic) 01/04/2020  . Educated about COVID-19 virus infection 06/04/2019  . Dyslipidemia 10/25/2018  . Medication management 05/27/2017  . PAF (paroxysmal atrial fibrillation) (HCC) 05/27/2017  . CKD (chronic kidney disease), stage III (HCC) 05/27/2017  . Cardiac arrhythmia 04/11/2017  . Palpitation 04/11/2017  . Chest discomfort 03/30/2017  . Essential hypertension 03/30/2017  . Type 2 diabetes mellitus without complication (HCC) 03/30/2017  . Hyperlipidemia 03/30/2017    Past Surgical History:  Procedure Laterality Date  . ABDOMINAL HYSTERECTOMY    . CATARACT EXTRACTION Left 03/2015   Current Outpatient Medications on File Prior to Visit  Medication Sig Dispense Refill  . apixaban (ELIQUIS) 2.5 MG TABS tablet Take 1 tablet (2.5 mg total) by mouth 2 (two) times daily. 90 tablet 3  . atenolol (TENORMIN) 50 MG tablet TK 1 T PO QD  4  . chlorthalidone (HYGROTON) 25 MG tablet Take 1/2 tablet daily. 45 tablet 3  . enalapril (VASOTEC) 10 MG tablet Take 1 tablet (10 mg total) by mouth once for 1 dose. 90 tablet 3  . gabapentin (NEURONTIN) 300 MG capsule Take 300 mg by mouth 3 (three) times daily.    . metFORMIN (GLUCOPHAGE) 500 MG tablet TK 2 TS PO  BID  3  . pravastatin (PRAVACHOL) 40 MG tablet TK 1 T PO QD  4  . vitamin B-12 2000 MCG tablet Take 1 tablet (2,000 mcg total) by mouth daily. 60 tablet 0   No current facility-administered medications on file prior to visit.    No Known Allergies Social History   Occupational History  . Not on file  Tobacco Use  . Smoking status: Never Smoker  . Smokeless tobacco: Never Used  Vaping Use  . Vaping Use: Never used  Substance and Sexual Activity  . Alcohol use: No    Alcohol/week: 0.0 standard drinks  . Drug use: No  . Sexual activity: Not on file   Family History  Problem Relation Age of Onset  . Cancer Father        lung    There is no immunization history on file for  this patient.   Review of Systems: Negative except as noted in the HPI.  Objective: There were no vitals filed for this visit.  Jamie Mueller Page is a pleasant 86 y.o. female, WD, WN in NAD. AAO X 3.  Vascular Examination: Capillary fill time to digits <3 seconds b/l lower extremities. Faintly palpable DP pulse(s) b/l lower extremities. Nonpalpable PT pulse(s) b/l lower extremities. Pedal hair present. Lower extremity skin temperature gradient within normal limits. No edema noted b/l lower extremities. No ischemia or gangrene  noted b/l lower extremities.  Dermatological Examination: Pedal skin with normal turgor, texture and tone bilaterally. No open wounds bilaterally. No interdigital macerations bilaterally. Toenails 1-5 b/l elongated, discolored, dystrophic, thickened, crumbly with subungual debris and tenderness to dorsal palpation. Subungual abscess noted right hallux with tenderness to palpation and  with debridement of toenail.  Musculoskeletal Examination: Normal muscle strength 5/5 to all lower extremity muscle groups bilaterally. No pain crepitus or joint limitation noted with ROM b/l. Hallux valgus with bunion deformity noted b/l lower extremities. Hammertoes noted to the 2-5 bilaterally.  Footwear Assessment: Does the patient wear appropriate shoes? Yes.. Does the patient need inserts/orthotics? No.  Neurological Examination: Protective sensation decreased with 10 gram monofilament b/l. Vibratory sensation diminished b/l. Clonus negative b/l.  Xray findings right foot: No gas in tissues right foot. No evidence of fracture right foot. No foreign body evident right foot. No bone erosion noted right hallux.  Assessment: 1. Pain due to onychomycosis of toenails of both feet   2. Abscess of great toe of right foot   3. Hallux valgus, acquired, bilateral   4. Acquired hammertoes of both feet   5. Type II diabetes mellitus with peripheral circulatory disorder (HCC)   6. Encounter for diabetic foot exam (HCC)      ADA Risk Categorization: High Risk  Patient has one or more of the following: Loss of protective sensation Absent pedal pulses Severe Foot deformity History of foot ulcer  Plan: -Examined patient. -Diabetic foot examination performed on today's visit. -Patient to continue soft, supportive shoe gear daily. -Toenails 1-5 b/l were debrided in length and girth with sterile nail nippers and dremel without iatrogenic bleeding.  -Patient to report any pedal injuries to medical  professional immediately. -Patient also evaluated/treated by Jamie Mueller. Please see his note regarding visit. -Patient/POA to call should there be question/concern in the interim.  Return in about 2 weeks (around 01/18/2020) for Jamie Mueller .  Jamie Breech, DPM

## 2020-01-11 ENCOUNTER — Other Ambulatory Visit: Payer: Self-pay | Admitting: Cardiology

## 2020-01-25 ENCOUNTER — Other Ambulatory Visit: Payer: Self-pay

## 2020-01-25 ENCOUNTER — Ambulatory Visit: Payer: Medicare HMO | Admitting: Podiatry

## 2020-01-25 DIAGNOSIS — L02611 Cutaneous abscess of right foot: Secondary | ICD-10-CM

## 2020-01-25 NOTE — Progress Notes (Signed)
  Subjective:  Patient ID: Jamie Mueller, female    DOB: 10/18/1930,  MRN: [id number]  Chief Complaint  Patient presents with  . Foot Problem    Right 1st toe check, pt states some pus drainage, denies fever/chills/nausea/vomiting and denies pain.    87 y.o. female presents for follow up of nail procedure. History confirmed with patient.   Objective:  Physical Exam: Right great toenail with small area of ingrown nail medially. No warmth, erythema, drainage noted. POP about the toe. Assessment:   1. Abscess of great toe of right foot      Plan:  Patient was evaluated and treated and all questions answered.  Ingrown Nail R 1st Medial -Healing well. Residual nail debrided with nail nipper and dermal curette. Patient tolerated reasonably. -Soak in water and epsom salt daily. No need for daily bandaging. F/u in 1 month to ensure no residual issues and full healing.

## 2020-02-11 DIAGNOSIS — I480 Paroxysmal atrial fibrillation: Secondary | ICD-10-CM | POA: Diagnosis not present

## 2020-02-11 DIAGNOSIS — Z1389 Encounter for screening for other disorder: Secondary | ICD-10-CM | POA: Diagnosis not present

## 2020-02-11 DIAGNOSIS — R413 Other amnesia: Secondary | ICD-10-CM | POA: Diagnosis not present

## 2020-02-11 DIAGNOSIS — I10 Essential (primary) hypertension: Secondary | ICD-10-CM | POA: Diagnosis not present

## 2020-02-11 DIAGNOSIS — E7800 Pure hypercholesterolemia, unspecified: Secondary | ICD-10-CM | POA: Diagnosis not present

## 2020-02-11 DIAGNOSIS — Z7984 Long term (current) use of oral hypoglycemic drugs: Secondary | ICD-10-CM | POA: Diagnosis not present

## 2020-02-11 DIAGNOSIS — E1122 Type 2 diabetes mellitus with diabetic chronic kidney disease: Secondary | ICD-10-CM | POA: Diagnosis not present

## 2020-02-11 DIAGNOSIS — Z0000 Encounter for general adult medical examination without abnormal findings: Secondary | ICD-10-CM | POA: Diagnosis not present

## 2020-02-22 ENCOUNTER — Ambulatory Visit (INDEPENDENT_AMBULATORY_CARE_PROVIDER_SITE_OTHER): Payer: Medicare Other | Admitting: Podiatry

## 2020-02-22 ENCOUNTER — Other Ambulatory Visit: Payer: Self-pay

## 2020-02-22 DIAGNOSIS — M79676 Pain in unspecified toe(s): Secondary | ICD-10-CM | POA: Diagnosis not present

## 2020-02-22 DIAGNOSIS — L600 Ingrowing nail: Secondary | ICD-10-CM | POA: Diagnosis not present

## 2020-02-22 NOTE — Progress Notes (Signed)
  Subjective:  Patient ID: Jamie Mueller, female    DOB: 03-16-1930,  MRN: [id number]  Chief Complaint  Patient presents with  . Nail Problem    Nail check, pt states doing well, denies any concerns.    87 y.o. female presents for follow up of nail procedure. History confirmed with patient.   Objective:  Physical Exam: Right great toenail without active ingrown nail, no warmth, erythema, signs of infection. Assessment:   1. Ingrown nail   2. Pain around toenail      Plan:  Patient was evaluated and treated and all questions answered.  Ingrown Nail R 1st Medial -Appears resolved without recurrence. Gently curettage of nail border. F/u PRN should issues recur.

## 2020-03-06 DIAGNOSIS — H6123 Impacted cerumen, bilateral: Secondary | ICD-10-CM | POA: Diagnosis not present

## 2020-03-18 DIAGNOSIS — H6123 Impacted cerumen, bilateral: Secondary | ICD-10-CM | POA: Diagnosis not present

## 2020-04-11 ENCOUNTER — Emergency Department (HOSPITAL_COMMUNITY): Payer: Medicare Other

## 2020-04-11 ENCOUNTER — Other Ambulatory Visit: Payer: Self-pay

## 2020-04-11 ENCOUNTER — Encounter (HOSPITAL_COMMUNITY): Payer: Self-pay | Admitting: Emergency Medicine

## 2020-04-11 DIAGNOSIS — I2102 ST elevation (STEMI) myocardial infarction involving left anterior descending coronary artery: Secondary | ICD-10-CM | POA: Diagnosis not present

## 2020-04-11 DIAGNOSIS — J811 Chronic pulmonary edema: Secondary | ICD-10-CM | POA: Diagnosis not present

## 2020-04-11 DIAGNOSIS — I2119 ST elevation (STEMI) myocardial infarction involving other coronary artery of inferior wall: Principal | ICD-10-CM | POA: Diagnosis present

## 2020-04-11 DIAGNOSIS — R7401 Elevation of levels of liver transaminase levels: Secondary | ICD-10-CM | POA: Diagnosis not present

## 2020-04-11 DIAGNOSIS — Z515 Encounter for palliative care: Secondary | ICD-10-CM

## 2020-04-11 DIAGNOSIS — N179 Acute kidney failure, unspecified: Secondary | ICD-10-CM | POA: Diagnosis present

## 2020-04-11 DIAGNOSIS — R1084 Generalized abdominal pain: Secondary | ICD-10-CM | POA: Diagnosis not present

## 2020-04-11 DIAGNOSIS — J90 Pleural effusion, not elsewhere classified: Secondary | ICD-10-CM | POA: Diagnosis not present

## 2020-04-11 DIAGNOSIS — E785 Hyperlipidemia, unspecified: Secondary | ICD-10-CM | POA: Diagnosis not present

## 2020-04-11 DIAGNOSIS — E854 Organ-limited amyloidosis: Secondary | ICD-10-CM | POA: Diagnosis not present

## 2020-04-11 DIAGNOSIS — I340 Nonrheumatic mitral (valve) insufficiency: Secondary | ICD-10-CM | POA: Diagnosis not present

## 2020-04-11 DIAGNOSIS — I130 Hypertensive heart and chronic kidney disease with heart failure and stage 1 through stage 4 chronic kidney disease, or unspecified chronic kidney disease: Secondary | ICD-10-CM | POA: Diagnosis present

## 2020-04-11 DIAGNOSIS — Z95828 Presence of other vascular implants and grafts: Secondary | ICD-10-CM

## 2020-04-11 DIAGNOSIS — Z7984 Long term (current) use of oral hypoglycemic drugs: Secondary | ICD-10-CM

## 2020-04-11 DIAGNOSIS — I213 ST elevation (STEMI) myocardial infarction of unspecified site: Secondary | ICD-10-CM

## 2020-04-11 DIAGNOSIS — F0390 Unspecified dementia without behavioral disturbance: Secondary | ICD-10-CM | POA: Diagnosis present

## 2020-04-11 DIAGNOSIS — E1169 Type 2 diabetes mellitus with other specified complication: Secondary | ICD-10-CM | POA: Diagnosis present

## 2020-04-11 DIAGNOSIS — Z4682 Encounter for fitting and adjustment of non-vascular catheter: Secondary | ICD-10-CM | POA: Diagnosis not present

## 2020-04-11 DIAGNOSIS — K5730 Diverticulosis of large intestine without perforation or abscess without bleeding: Secondary | ICD-10-CM | POA: Diagnosis not present

## 2020-04-11 DIAGNOSIS — E871 Hypo-osmolality and hyponatremia: Secondary | ICD-10-CM | POA: Diagnosis not present

## 2020-04-11 DIAGNOSIS — I4892 Unspecified atrial flutter: Secondary | ICD-10-CM | POA: Diagnosis not present

## 2020-04-11 DIAGNOSIS — I43 Cardiomyopathy in diseases classified elsewhere: Secondary | ICD-10-CM | POA: Diagnosis not present

## 2020-04-11 DIAGNOSIS — Z7189 Other specified counseling: Secondary | ICD-10-CM | POA: Diagnosis not present

## 2020-04-11 DIAGNOSIS — R0682 Tachypnea, not elsewhere classified: Secondary | ICD-10-CM | POA: Diagnosis not present

## 2020-04-11 DIAGNOSIS — E1122 Type 2 diabetes mellitus with diabetic chronic kidney disease: Secondary | ICD-10-CM | POA: Diagnosis not present

## 2020-04-11 DIAGNOSIS — E1151 Type 2 diabetes mellitus with diabetic peripheral angiopathy without gangrene: Secondary | ICD-10-CM | POA: Diagnosis not present

## 2020-04-11 DIAGNOSIS — Z66 Do not resuscitate: Secondary | ICD-10-CM | POA: Diagnosis not present

## 2020-04-11 DIAGNOSIS — R9431 Abnormal electrocardiogram [ECG] [EKG]: Secondary | ICD-10-CM | POA: Diagnosis not present

## 2020-04-11 DIAGNOSIS — I700 Atherosclerosis of aorta: Secondary | ICD-10-CM | POA: Diagnosis present

## 2020-04-11 DIAGNOSIS — Z79899 Other long term (current) drug therapy: Secondary | ICD-10-CM

## 2020-04-11 DIAGNOSIS — R0600 Dyspnea, unspecified: Secondary | ICD-10-CM | POA: Diagnosis not present

## 2020-04-11 DIAGNOSIS — I255 Ischemic cardiomyopathy: Secondary | ICD-10-CM | POA: Clinically undetermined

## 2020-04-11 DIAGNOSIS — E538 Deficiency of other specified B group vitamins: Secondary | ICD-10-CM | POA: Diagnosis present

## 2020-04-11 DIAGNOSIS — N183 Chronic kidney disease, stage 3 unspecified: Secondary | ICD-10-CM | POA: Diagnosis not present

## 2020-04-11 DIAGNOSIS — I5041 Acute combined systolic (congestive) and diastolic (congestive) heart failure: Secondary | ICD-10-CM | POA: Diagnosis not present

## 2020-04-11 DIAGNOSIS — E119 Type 2 diabetes mellitus without complications: Secondary | ICD-10-CM | POA: Diagnosis not present

## 2020-04-11 DIAGNOSIS — I480 Paroxysmal atrial fibrillation: Secondary | ICD-10-CM | POA: Diagnosis present

## 2020-04-11 DIAGNOSIS — R570 Cardiogenic shock: Secondary | ICD-10-CM | POA: Diagnosis not present

## 2020-04-11 DIAGNOSIS — I10 Essential (primary) hypertension: Secondary | ICD-10-CM | POA: Diagnosis not present

## 2020-04-11 DIAGNOSIS — R509 Fever, unspecified: Secondary | ICD-10-CM | POA: Diagnosis not present

## 2020-04-11 DIAGNOSIS — Z90710 Acquired absence of both cervix and uterus: Secondary | ICD-10-CM

## 2020-04-11 DIAGNOSIS — E876 Hypokalemia: Secondary | ICD-10-CM | POA: Diagnosis present

## 2020-04-11 DIAGNOSIS — N1831 Chronic kidney disease, stage 3a: Secondary | ICD-10-CM | POA: Diagnosis present

## 2020-04-11 DIAGNOSIS — Z20822 Contact with and (suspected) exposure to covid-19: Secondary | ICD-10-CM | POA: Diagnosis present

## 2020-04-11 DIAGNOSIS — I4891 Unspecified atrial fibrillation: Secondary | ICD-10-CM | POA: Diagnosis present

## 2020-04-11 DIAGNOSIS — E1159 Type 2 diabetes mellitus with other circulatory complications: Secondary | ICD-10-CM | POA: Diagnosis present

## 2020-04-11 DIAGNOSIS — F05 Delirium due to known physiological condition: Secondary | ICD-10-CM | POA: Diagnosis not present

## 2020-04-11 DIAGNOSIS — N170 Acute kidney failure with tubular necrosis: Secondary | ICD-10-CM | POA: Diagnosis not present

## 2020-04-11 DIAGNOSIS — M160 Bilateral primary osteoarthritis of hip: Secondary | ICD-10-CM | POA: Diagnosis not present

## 2020-04-11 DIAGNOSIS — F458 Other somatoform disorders: Secondary | ICD-10-CM | POA: Diagnosis not present

## 2020-04-11 DIAGNOSIS — I2121 ST elevation (STEMI) myocardial infarction involving left circumflex coronary artery: Secondary | ICD-10-CM | POA: Diagnosis not present

## 2020-04-11 DIAGNOSIS — I5021 Acute systolic (congestive) heart failure: Secondary | ICD-10-CM | POA: Diagnosis not present

## 2020-04-11 DIAGNOSIS — J9811 Atelectasis: Secondary | ICD-10-CM | POA: Diagnosis not present

## 2020-04-11 DIAGNOSIS — R109 Unspecified abdominal pain: Secondary | ICD-10-CM | POA: Diagnosis not present

## 2020-04-11 DIAGNOSIS — R0602 Shortness of breath: Secondary | ICD-10-CM | POA: Diagnosis not present

## 2020-04-11 DIAGNOSIS — I361 Nonrheumatic tricuspid (valve) insufficiency: Secondary | ICD-10-CM | POA: Diagnosis not present

## 2020-04-11 DIAGNOSIS — I2109 ST elevation (STEMI) myocardial infarction involving other coronary artery of anterior wall: Secondary | ICD-10-CM | POA: Diagnosis present

## 2020-04-11 DIAGNOSIS — M4316 Spondylolisthesis, lumbar region: Secondary | ICD-10-CM | POA: Diagnosis not present

## 2020-04-11 DIAGNOSIS — R062 Wheezing: Secondary | ICD-10-CM | POA: Diagnosis not present

## 2020-04-11 DIAGNOSIS — R34 Anuria and oliguria: Secondary | ICD-10-CM | POA: Diagnosis not present

## 2020-04-11 DIAGNOSIS — I25119 Atherosclerotic heart disease of native coronary artery with unspecified angina pectoris: Secondary | ICD-10-CM | POA: Diagnosis present

## 2020-04-11 DIAGNOSIS — I517 Cardiomegaly: Secondary | ICD-10-CM | POA: Diagnosis not present

## 2020-04-11 DIAGNOSIS — I509 Heart failure, unspecified: Secondary | ICD-10-CM

## 2020-04-11 DIAGNOSIS — R451 Restlessness and agitation: Secondary | ICD-10-CM | POA: Diagnosis not present

## 2020-04-11 DIAGNOSIS — R1011 Right upper quadrant pain: Secondary | ICD-10-CM | POA: Diagnosis not present

## 2020-04-11 LAB — COMPREHENSIVE METABOLIC PANEL
ALT: 55 U/L — ABNORMAL HIGH (ref 0–44)
AST: 467 U/L — ABNORMAL HIGH (ref 15–41)
Albumin: 3.3 g/dL — ABNORMAL LOW (ref 3.5–5.0)
Alkaline Phosphatase: 75 U/L (ref 38–126)
Anion gap: 9 (ref 5–15)
BUN: 23 mg/dL (ref 8–23)
CO2: 25 mmol/L (ref 22–32)
Calcium: 8.6 mg/dL — ABNORMAL LOW (ref 8.9–10.3)
Chloride: 104 mmol/L (ref 98–111)
Creatinine, Ser: 1.88 mg/dL — ABNORMAL HIGH (ref 0.44–1.00)
GFR, Estimated: 26 mL/min — ABNORMAL LOW (ref >60)
Glucose, Bld: 239 mg/dL — ABNORMAL HIGH (ref 70–99)
Potassium: 4.4 mmol/L (ref 3.5–5.1)
Sodium: 138 mmol/L (ref 135–145)
Total Bilirubin: 0.7 mg/dL (ref 0.3–1.2)
Total Protein: 6.9 g/dL (ref 6.5–8.1)

## 2020-04-11 LAB — CBC
HCT: 37 % (ref 36.0–46.0)
Hemoglobin: 11.6 g/dL — ABNORMAL LOW (ref 12.0–15.0)
MCH: 26.7 pg (ref 26.0–34.0)
MCHC: 31.4 g/dL (ref 30.0–36.0)
MCV: 85.3 fL (ref 80.0–100.0)
Platelets: 277 10*3/uL (ref 150–400)
RBC: 4.34 MIL/uL (ref 3.87–5.11)
RDW: 14.1 % (ref 11.5–15.5)
WBC: 6.5 10*3/uL (ref 4.0–10.5)
nRBC: 0 % (ref 0.0–0.2)

## 2020-04-11 LAB — LIPASE, BLOOD: Lipase: 39 U/L (ref 11–51)

## 2020-04-11 MED ORDER — METOPROLOL TARTRATE 5 MG/5ML IV SOLN
2.5000 mg | Freq: Once | INTRAVENOUS | Status: AC
  Administered 2020-04-11: 2.5 mg via INTRAVENOUS
  Filled 2020-04-11: qty 5

## 2020-04-11 MED ORDER — SODIUM CHLORIDE 0.9 % IV BOLUS
500.0000 mL | Freq: Once | INTRAVENOUS | Status: AC
  Administered 2020-04-11: 500 mL via INTRAVENOUS

## 2020-04-11 NOTE — ED Notes (Signed)
Labeled specimen cup provided to pt for urine collection. ENMiles 

## 2020-04-11 NOTE — ED Provider Notes (Incomplete)
Blood pressure (!) 87/60, pulse (!) 117, temperature 98 F (36.7 C), temperature source Oral, resp. rate (!) 25, height 5\' 2"  (1.575 m), weight 59 kg, SpO2 97 %.  Assuming care from Dr. .  In short, Jamie Mueller is a 87 y.o. female with a chief complaint of Abdominal Pain .  Refer to the original H&P for additional details.  The current plan of care is to follow up on CT and troponin. Case discussed with Cardiology earlier with concern for STEMI criteria. No symptoms currently. Plan for admit.

## 2020-04-11 NOTE — ED Provider Notes (Signed)
[facility] DEPT Provider Note   CSN: [id number] Arrival date & time: 04/02/2020  2011     History Chief Complaint  Patient presents with  . Abdominal Pain    Jamie Mueller is a 87 y.o. female.  HPI      87 year old female with a history of diabetes, hypertension, hyperlipidemia, paroxysmal atrial fibrillation, peripheral vascular disease, vitamin B12 deficiency, who presents with concern for abdominal pain.  She is unable to remember exactly when her symptoms started, or what they felt like, but reports that she had some upper abdominal pain.  Her daughter is at bedside and assists with history.  Reports that she lives with her brother, and that her brother called her today to report that she had told him she "was not feeling well" --she had told him that she was having severe right upper quadrant abdominal pain.  She did not say she was having chest pain or difficulty breathing.  She described this upper abdominal pain and feeling unwell.  She has a history of atrial fibrillation, however daughter is not sure if it is persistent or paroxysmal. She has not had nausea, vomiting, diarrhea, fever, or other concerns. On my history denies any chest pain.  Denies any active pain or discomfort at this time, including no abdominal pain, no chest pain, no dyspnea.   Past Medical History:  Diagnosis Date  . Diabetes mellitus without complication ([place])   . Hypercholesteremia   . Hypertension    benign  . Memory loss   . PAF (paroxysmal atrial fibrillation) ([place])   . Vitamin B 12 deficiency     Patient Active Problem List   Diagnosis Date Noted  . Diabetic renal disease ([place]) 01/04/2020  . 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 Memory loss 01/04/2020  . Peripheral vascular disease ([place]) 01/04/2020  . Pure hypercholesterolemia 01/04/2020  . Vitamin B12 deficiency (non anemic) 01/04/2020  . Educated about COVID-19 virus infection  06/04/2019  . Dyslipidemia 10/25/2018  . Medication management 05/27/2017  . PAF (paroxysmal atrial fibrillation) ([place]) 05/27/2017  . CKD (chronic kidney disease), stage III ([place]) 05/27/2017  . Cardiac arrhythmia 04/11/2017  . Palpitation 04/11/2017  . Chest discomfort 03/30/2017  . Essential hypertension 03/30/2017  . Type 2 diabetes mellitus without complication ([place]) 28/76/8115  . Hyperlipidemia 03/30/2017    Past Surgical History:  Procedure Laterality Date  . ABDOMINAL HYSTERECTOMY    . CATARACT EXTRACTION Left 03/2015     OB History   No obstetric history on file.     Family History  Problem Relation Age of Onset  . Cancer Father        lung    Social History   Tobacco Use  . Smoking status: Never Smoker  . Smokeless tobacco: Never Used  Vaping Use  . Vaping Use: Never used  Substance Use Topics  . Alcohol use: No    Alcohol/week: 0.0 standard drinks  . Drug use: No    Home Medications Prior to Admission medications   Medication Sig Start Date End Date Taking? Authorizing Provider  atenolol (TENORMIN) 50 MG tablet Take 50 mg by mouth daily. 11/05/14  Yes [provider]  chlorthalidone (HYGROTON) 25 MG tablet Take 1/2 tablet daily. Patient taking differently: Take 12.5 mg by mouth daily as needed (leg swelling). 10/26/18  Yes Minus Breeding, MD  enalapril (VASOTEC) 10 MG tablet Take 1 tablet (10 mg total) by mouth once for 1 dose. Patient taking differently: Take 10 mg  by mouth daily. 10/26/18 10/26/18 Yes Minus Breeding, MD  gabapentin (NEURONTIN) 300 MG capsule Take 300 mg by mouth 3 (three) times daily.   Yes [provider]  metFORMIN (GLUCOPHAGE) 500 MG tablet Take 1,000 mg by mouth 2 (two) times daily with a meal. 10/16/14  Yes [provider]  pravastatin (PRAVACHOL) 40 MG tablet Take 40 mg by mouth daily. 11/16/14  Yes [provider]  Blood Glucose Calibration (ONETOUCH VERIO) SOLN  01/22/20   [provider]  Blood Glucose Monitoring Suppl ([place]) w/Device KIT  01/22/20   [provider]  [facility] VERIO test strip 1 each daily. 01/22/20   [provider]    Allergies    Patient has no known allergies.  Review of Systems   Review of Systems  Constitutional: Positive for fatigue. Negative for fever.  HENT: Negative for sore throat.   Eyes: Negative for visual disturbance.  Respiratory: Negative for cough and shortness of breath.   Cardiovascular: Negative for chest pain.  Gastrointestinal: Positive for abdominal pain. Negative for nausea and vomiting.  Genitourinary: Negative for difficulty urinating.  Musculoskeletal: Negative for back pain and neck pain.  Skin: Negative for rash.  Neurological: Negative for syncope and headaches.    Physical Exam Updated Vital Signs BP 100/76   Pulse (!) 116   Temp 98 F (36.7 C) (Oral)   Resp 17   Ht 5' 2"  (1.575 m)   Wt 59 kg   SpO2 100%   BMI 23.78 kg/m   Physical Exam Vitals and nursing note reviewed.  Constitutional:      General: She is not in acute distress.    Appearance: She is well-developed. She is not diaphoretic.  HENT:     Head: Normocephalic and atraumatic.  Eyes:     Conjunctiva/sclera: Conjunctivae normal.  Cardiovascular:     Rate and Rhythm: Tachycardia present. Rhythm irregularly irregular.     Heart sounds: Normal heart sounds. No murmur heard. No friction rub. No gallop.   Pulmonary:     Effort: Pulmonary effort is normal. No respiratory distress.     Breath sounds: Normal breath sounds. No wheezing or rales.  Abdominal:     General: There is no distension.     Palpations: Abdomen is soft.     Tenderness: There is generalized abdominal tenderness and tenderness in the right upper quadrant. There is no guarding.  Musculoskeletal:        General: No tenderness.     Cervical back: Normal range of motion.  Skin:    General: Skin is warm and dry.     Findings: No erythema  or rash.  Neurological:     Mental Status: She is alert and oriented to person, place, and time.     ED Results / Procedures / Treatments   Labs (all labs ordered are listed, but only abnormal results are displayed) Labs Reviewed  COMPREHENSIVE METABOLIC PANEL - Abnormal; Notable for the following components:      Result Value   Glucose, Bld 239 (*)    Creatinine, Ser 1.88 (*)    Calcium 8.6 (*)    Albumin 3.3 (*)    AST 467 (*)    ALT 55 (*)    GFR, Estimated 26 (*)    All other components within normal limits  CBC - Abnormal; Notable for the following components:   Hemoglobin 11.6 (*)    All other components within normal limits  TROPONIN I (HIGH  SENSITIVITY) - Abnormal; Notable for the following components:   Troponin I (High Sensitivity) >135,540 (*)    All other components within normal limits  RESP PANEL BY RT-PCR (FLU A&B, COVID) ARPGX2  LIPASE, BLOOD  URINALYSIS, ROUTINE W REFLEX MICROSCOPIC  HEMOGLOBIN A1C  PROTIME-INR  APTT  LIPID PANEL  TROPONIN I (HIGH SENSITIVITY)    EKG None  Radiology CT ABDOMEN PELVIS WO CONTRAST  Result Date: 04/06/2020 CLINICAL DATA:  Right upper quadrant pain that radiates to back. EXAM: CT ABDOMEN AND PELVIS WITHOUT CONTRAST TECHNIQUE: Multidetector CT imaging of the abdomen and pelvis was performed following the standard protocol without IV contrast. COMPARISON:  None. FINDINGS: Lower chest: Bibasilar atelectasis. Hepatobiliary: Unremarkable noncontrast appearance of the hepatic parenchyma. Gallbladder surgically absent. No biliary ductal dilation. Pancreas: Unremarkable. Spleen: Unremarkable. Adrenals/Urinary Tract: Bilateral adrenal glands are unremarkable. No hydronephrosis. No nephrolithiasis. Urinary bladder is grossly unremarkable for degree of distension. Stomach/Bowel: Stomach is grossly unremarkable for degree of distension. No small bowel dilation. Appendix is visualized in the right lower quadrant and appears unremarkable.  Scattered colonic diverticula without findings acute diverticulitis. Vascular/Lymphatic: Aortic atherosclerosis. No enlarged abdominal or pelvic lymph nodes. Reproductive: Status post hysterectomy. No adnexal masses. Other: No abdominopelvic ascites. Musculoskeletal: Mild levoconvex curvature of the spine multilevel degenerative changes spine. Grade 1 degenerative anterolisthesis of L4 on L5. Degenerative change of the hips, SI joints and pubic symphysis. IMPRESSION: 1. No acute findings in the abdomen or pelvis. 2. Colonic diverticulosis without findings of acute diverticulitis. 3. Aortic atherosclerosis. Aortic Atherosclerosis (ICD10-I70.0). Electronically Signed   By: Dahlia Bailiff MD   On: 03/30/2020 23:57   DG Chest Portable 1 View  Result Date: 04/14/2020 CLINICAL DATA:  Dyspnea. EXAM: PORTABLE CHEST 1 VIEW COMPARISON:  Remote exam 04/29/2003 FINDINGS: Chronic cardiomegaly unchanged. Normal mediastinal contours. No acute or focal airspace disease. No pulmonary edema, pleural effusion, or pneumothorax. Surgical clips in the left thoracic inlet. Mild degenerative change of the left shoulder. Scoliotic curvature of the lower thoracic spine. IMPRESSION: Chronic cardiomegaly without acute abnormality. Electronically Signed   By: Keith Rake M.D.   On: 03/28/2020 22:51    Procedures .Critical Care Performed by: Gareth Morgan, MD Authorized by: Gareth Morgan, MD   Critical care provider statement:    Critical care time (minutes):  45   Critical care was time spent personally by me on the following activities:  Discussions with consultants, evaluation of patient's response to treatment, examination of patient, ordering and performing treatments and interventions, ordering and review of laboratory studies, ordering and review of radiographic studies, pulse oximetry, re-evaluation of patient's condition, obtaining history from patient or surrogate and review of old charts     Medications  Ordered in ED Medications  metoprolol tartrate (LOPRESSOR) injection 2.5 mg (2.5 mg Intravenous Given 03/24/2020 2230)  sodium chloride 0.9 % bolus 500 mL (0 mLs Intravenous Stopped 03/21/2020 0001)  aspirin chewable tablet 324 mg (324 mg Oral Given 03/20/2020 0002)  heparin injection 3,550 Units (3,550 Units Intravenous Given 03/25/2020 0017)    ED Course  I have reviewed the triage vital signs and the nursing notes.  Pertinent labs & imaging results that were available during my care of the patient were reviewed by me and considered in my medical decision making (see chart for details).    MDM Rules/Calculators/A&P                          87 year old female with a history of diabetes,  hypertension, hyperlipidemia, paroxysmal atrial fibrillation (unclear if on anticoagulation), peripheral vascular disease, vitamin B12 deficiency, who presents with concern for abdominal pain.  On arrival to the ED appears to be in atrial fibrillation with RVR and ECG shows ST elevation in the lateral leads.  She is currently not having any symptoms, including no chest pain, abdominal pain or shortness of breath.  Paged Dr. Gwenlyn Found twice and sent message in Epic however did not hear a response and discussed ECG with Dr. Vickki Muff of Cardiology.  Discussed with Cardiology and patient that if she were having symptoms would activate cath lab but in discussion with Cardiology we do not feel this is appropriate given lack of current symptoms.    Labs pending including troponin. Given 2.64m metoprolol for atrial fibrillation with RVR and BP 1438systolic.  Ordered CT abdomen pelvis given abdominal tenderness on exam and history of abdominal pain as presenting symptom prior to arrival.   After sign out to Dr. LLaverta Baltimore troponin returned greater than 135000.  Activated CODE STEMI.  Daughter unsure whether patient is taking eliquis.  Given aspirin and heparin. [facility] cath lab with Dr. BGwenlyn Found       Final Clinical Impression(s) / ED Diagnoses Final diagnoses:  ST elevation myocardial infarction (STEMI), unspecified artery (HCC)  Atrial fibrillation with RVR (HCC)  Abdominal pain, unspecified abdominal location    Rx / DC Orders ED Discharge Orders    None       SGareth Morgan MD 04/06/2020 [phone]

## 2020-04-11 NOTE — ED Triage Notes (Signed)
Patient poor historian, daughter at bedside. Patient states she woke up this morning with right upper quad pain that radiates to her back. Unable to elaborate on her condition. Denies other symptoms, no N/V/D or constipation. Patient with "some dementia" per daughter.

## 2020-04-12 ENCOUNTER — Encounter (HOSPITAL_COMMUNITY): Admission: EM | Disposition: E | Payer: Self-pay | Source: Home / Self Care | Attending: Cardiovascular Disease

## 2020-04-12 ENCOUNTER — Inpatient Hospital Stay: Payer: Self-pay

## 2020-04-12 ENCOUNTER — Inpatient Hospital Stay (HOSPITAL_COMMUNITY): Payer: Medicare Other

## 2020-04-12 DIAGNOSIS — N1831 Chronic kidney disease, stage 3a: Secondary | ICD-10-CM

## 2020-04-12 DIAGNOSIS — N179 Acute kidney failure, unspecified: Secondary | ICD-10-CM | POA: Diagnosis present

## 2020-04-12 DIAGNOSIS — I2121 ST elevation (STEMI) myocardial infarction involving left circumflex coronary artery: Secondary | ICD-10-CM | POA: Diagnosis not present

## 2020-04-12 DIAGNOSIS — I700 Atherosclerosis of aorta: Secondary | ICD-10-CM | POA: Diagnosis present

## 2020-04-12 DIAGNOSIS — Z20822 Contact with and (suspected) exposure to covid-19: Secondary | ICD-10-CM | POA: Diagnosis present

## 2020-04-12 DIAGNOSIS — I2102 ST elevation (STEMI) myocardial infarction involving left anterior descending coronary artery: Secondary | ICD-10-CM

## 2020-04-12 DIAGNOSIS — E1169 Type 2 diabetes mellitus with other specified complication: Secondary | ICD-10-CM | POA: Diagnosis not present

## 2020-04-12 DIAGNOSIS — K5730 Diverticulosis of large intestine without perforation or abscess without bleeding: Secondary | ICD-10-CM | POA: Diagnosis present

## 2020-04-12 DIAGNOSIS — I361 Nonrheumatic tricuspid (valve) insufficiency: Secondary | ICD-10-CM | POA: Diagnosis not present

## 2020-04-12 DIAGNOSIS — F0390 Unspecified dementia without behavioral disturbance: Secondary | ICD-10-CM | POA: Diagnosis present

## 2020-04-12 DIAGNOSIS — I2109 ST elevation (STEMI) myocardial infarction involving other coronary artery of anterior wall: Secondary | ICD-10-CM | POA: Diagnosis not present

## 2020-04-12 DIAGNOSIS — I517 Cardiomegaly: Secondary | ICD-10-CM | POA: Diagnosis not present

## 2020-04-12 DIAGNOSIS — R509 Fever, unspecified: Secondary | ICD-10-CM | POA: Diagnosis not present

## 2020-04-12 DIAGNOSIS — I340 Nonrheumatic mitral (valve) insufficiency: Secondary | ICD-10-CM

## 2020-04-12 DIAGNOSIS — Z4682 Encounter for fitting and adjustment of non-vascular catheter: Secondary | ICD-10-CM | POA: Diagnosis not present

## 2020-04-12 DIAGNOSIS — I4891 Unspecified atrial fibrillation: Secondary | ICD-10-CM

## 2020-04-12 DIAGNOSIS — I2119 ST elevation (STEMI) myocardial infarction involving other coronary artery of inferior wall: Secondary | ICD-10-CM | POA: Diagnosis present

## 2020-04-12 DIAGNOSIS — I130 Hypertensive heart and chronic kidney disease with heart failure and stage 1 through stage 4 chronic kidney disease, or unspecified chronic kidney disease: Secondary | ICD-10-CM | POA: Diagnosis present

## 2020-04-12 DIAGNOSIS — R9431 Abnormal electrocardiogram [ECG] [EKG]: Secondary | ICD-10-CM | POA: Diagnosis not present

## 2020-04-12 DIAGNOSIS — E119 Type 2 diabetes mellitus without complications: Secondary | ICD-10-CM | POA: Diagnosis not present

## 2020-04-12 DIAGNOSIS — N170 Acute kidney failure with tubular necrosis: Secondary | ICD-10-CM | POA: Diagnosis not present

## 2020-04-12 DIAGNOSIS — I5041 Acute combined systolic (congestive) and diastolic (congestive) heart failure: Secondary | ICD-10-CM | POA: Diagnosis not present

## 2020-04-12 DIAGNOSIS — R109 Unspecified abdominal pain: Secondary | ICD-10-CM

## 2020-04-12 DIAGNOSIS — J9811 Atelectasis: Secondary | ICD-10-CM | POA: Diagnosis not present

## 2020-04-12 DIAGNOSIS — Z7189 Other specified counseling: Secondary | ICD-10-CM | POA: Diagnosis not present

## 2020-04-12 DIAGNOSIS — R0600 Dyspnea, unspecified: Secondary | ICD-10-CM | POA: Diagnosis not present

## 2020-04-12 DIAGNOSIS — I213 ST elevation (STEMI) myocardial infarction of unspecified site: Secondary | ICD-10-CM | POA: Diagnosis present

## 2020-04-12 DIAGNOSIS — E871 Hypo-osmolality and hyponatremia: Secondary | ICD-10-CM | POA: Diagnosis not present

## 2020-04-12 DIAGNOSIS — I43 Cardiomyopathy in diseases classified elsewhere: Secondary | ICD-10-CM | POA: Diagnosis present

## 2020-04-12 DIAGNOSIS — E1159 Type 2 diabetes mellitus with other circulatory complications: Secondary | ICD-10-CM | POA: Diagnosis not present

## 2020-04-12 DIAGNOSIS — R0602 Shortness of breath: Secondary | ICD-10-CM | POA: Diagnosis not present

## 2020-04-12 DIAGNOSIS — R062 Wheezing: Secondary | ICD-10-CM | POA: Diagnosis not present

## 2020-04-12 DIAGNOSIS — I4892 Unspecified atrial flutter: Secondary | ICD-10-CM | POA: Diagnosis not present

## 2020-04-12 DIAGNOSIS — J90 Pleural effusion, not elsewhere classified: Secondary | ICD-10-CM | POA: Diagnosis not present

## 2020-04-12 DIAGNOSIS — Z66 Do not resuscitate: Secondary | ICD-10-CM | POA: Diagnosis present

## 2020-04-12 DIAGNOSIS — I255 Ischemic cardiomyopathy: Secondary | ICD-10-CM | POA: Diagnosis not present

## 2020-04-12 DIAGNOSIS — R7401 Elevation of levels of liver transaminase levels: Secondary | ICD-10-CM | POA: Diagnosis not present

## 2020-04-12 DIAGNOSIS — E854 Organ-limited amyloidosis: Secondary | ICD-10-CM | POA: Diagnosis present

## 2020-04-12 DIAGNOSIS — E538 Deficiency of other specified B group vitamins: Secondary | ICD-10-CM | POA: Diagnosis present

## 2020-04-12 DIAGNOSIS — R570 Cardiogenic shock: Secondary | ICD-10-CM | POA: Diagnosis not present

## 2020-04-12 DIAGNOSIS — E1122 Type 2 diabetes mellitus with diabetic chronic kidney disease: Secondary | ICD-10-CM | POA: Diagnosis present

## 2020-04-12 DIAGNOSIS — F05 Delirium due to known physiological condition: Secondary | ICD-10-CM | POA: Diagnosis not present

## 2020-04-12 DIAGNOSIS — I10 Essential (primary) hypertension: Secondary | ICD-10-CM | POA: Diagnosis not present

## 2020-04-12 DIAGNOSIS — F458 Other somatoform disorders: Secondary | ICD-10-CM | POA: Diagnosis not present

## 2020-04-12 DIAGNOSIS — E1151 Type 2 diabetes mellitus with diabetic peripheral angiopathy without gangrene: Secondary | ICD-10-CM | POA: Diagnosis present

## 2020-04-12 DIAGNOSIS — I5021 Acute systolic (congestive) heart failure: Secondary | ICD-10-CM | POA: Diagnosis not present

## 2020-04-12 DIAGNOSIS — E785 Hyperlipidemia, unspecified: Secondary | ICD-10-CM | POA: Diagnosis present

## 2020-04-12 DIAGNOSIS — Z515 Encounter for palliative care: Secondary | ICD-10-CM | POA: Diagnosis not present

## 2020-04-12 DIAGNOSIS — N183 Chronic kidney disease, stage 3 unspecified: Secondary | ICD-10-CM | POA: Diagnosis not present

## 2020-04-12 DIAGNOSIS — J811 Chronic pulmonary edema: Secondary | ICD-10-CM | POA: Diagnosis not present

## 2020-04-12 DIAGNOSIS — I480 Paroxysmal atrial fibrillation: Secondary | ICD-10-CM | POA: Diagnosis present

## 2020-04-12 LAB — CBC
HCT: 36.8 % (ref 36.0–46.0)
Hemoglobin: 11.5 g/dL — ABNORMAL LOW (ref 12.0–15.0)
MCH: 26.7 pg (ref 26.0–34.0)
MCHC: 31.3 g/dL (ref 30.0–36.0)
MCV: 85.4 fL (ref 80.0–100.0)
Platelets: 254 10*3/uL (ref 150–400)
RBC: 4.31 MIL/uL (ref 3.87–5.11)
RDW: 14.2 % (ref 11.5–15.5)
WBC: 6.6 10*3/uL (ref 4.0–10.5)
nRBC: 0 % (ref 0.0–0.2)

## 2020-04-12 LAB — GLUCOSE, CAPILLARY
Glucose-Capillary: 168 mg/dL — ABNORMAL HIGH (ref 70–99)
Glucose-Capillary: 176 mg/dL — ABNORMAL HIGH (ref 70–99)
Glucose-Capillary: 150 mg/dL — ABNORMAL HIGH (ref 70–99)
Glucose-Capillary: 202 mg/dL — ABNORMAL HIGH (ref 70–99)
Glucose-Capillary: 217 mg/dL — ABNORMAL HIGH (ref 70–99)

## 2020-04-12 LAB — LIPID PANEL
Cholesterol: 166 mg/dL (ref 0–200)
Cholesterol: 159 mg/dL (ref 0–200)
HDL: 55 mg/dL (ref >40)
HDL: 53 mg/dL (ref >40)
LDL Cholesterol: 90 mg/dL (ref 0–99)
LDL Cholesterol: 95 mg/dL (ref 0–99)
Total CHOL/HDL Ratio: 3 RATIO
Total CHOL/HDL Ratio: 3 RATIO
Triglycerides: 107 mg/dL (ref <150)
Triglycerides: 56 mg/dL (ref <150)
VLDL: 21 mg/dL (ref 0–40)
VLDL: 11 mg/dL (ref 0–40)

## 2020-04-12 LAB — COOXEMETRY PANEL
Carboxyhemoglobin: 0.8 % (ref 0.5–1.5)
Methemoglobin: 0.8 % (ref 0.0–1.5)
O2 Saturation: 48.5 %
Total hemoglobin: 12.9 g/dL (ref 12.0–16.0)

## 2020-04-12 LAB — HEPARIN LEVEL (UNFRACTIONATED)
Heparin Unfractionated: 0.37 IU/mL (ref 0.30–0.70)
Heparin Unfractionated: 0.4 IU/mL (ref 0.30–0.70)

## 2020-04-12 LAB — TROPONIN I (HIGH SENSITIVITY)
Troponin I (High Sensitivity): >135540 ng/L (ref <18)
Troponin I (High Sensitivity): >27000 ng/L (ref <18)
Troponin I (High Sensitivity): >27000 ng/L (ref <18)

## 2020-04-12 LAB — HEMOGLOBIN A1C
Hgb A1c MFr Bld: 6.7 % — ABNORMAL HIGH (ref 4.8–5.6)
Hgb A1c MFr Bld: 6.7 % — ABNORMAL HIGH (ref 4.8–5.6)
Mean Plasma Glucose: 145.59 mg/dL
Mean Plasma Glucose: 145.59 mg/dL

## 2020-04-12 LAB — ECHOCARDIOGRAM COMPLETE
Height: 62 in
S' Lateral: 2.8 cm
Weight: 2080 oz

## 2020-04-12 LAB — RESP PANEL BY RT-PCR (FLU A&B, COVID) ARPGX2
Influenza A by PCR: NEGATIVE
Influenza B by PCR: NEGATIVE
SARS Coronavirus 2 by RT PCR: NEGATIVE

## 2020-04-12 LAB — BASIC METABOLIC PANEL
Anion gap: 13 (ref 5–15)
BUN: 24 mg/dL — ABNORMAL HIGH (ref 8–23)
CO2: 24 mmol/L (ref 22–32)
Calcium: 8.2 mg/dL — ABNORMAL LOW (ref 8.9–10.3)
Chloride: 104 mmol/L (ref 98–111)
Creatinine, Ser: 2.03 mg/dL — ABNORMAL HIGH (ref 0.44–1.00)
GFR, Estimated: 23 mL/min — ABNORMAL LOW (ref >60)
Glucose, Bld: 210 mg/dL — ABNORMAL HIGH (ref 70–99)
Potassium: 4.5 mmol/L (ref 3.5–5.1)
Sodium: 141 mmol/L (ref 135–145)

## 2020-04-12 LAB — PROTIME-INR
INR: 1 (ref 0.8–1.2)
Prothrombin Time: 12.6 seconds (ref 11.4–15.2)

## 2020-04-12 LAB — APTT: aPTT: 29 seconds (ref 24–36)

## 2020-04-12 LAB — MRSA PCR SCREENING: MRSA by PCR: NEGATIVE

## 2020-04-12 LAB — MAGNESIUM: Magnesium: 1.4 mg/dL — ABNORMAL LOW (ref 1.7–2.4)

## 2020-04-12 SURGERY — CORONARY/GRAFT ACUTE MI REVASCULARIZATION
Anesthesia: LOCAL

## 2020-04-12 MED ORDER — CHLORHEXIDINE GLUCONATE CLOTH 2 % EX PADS
6.0000 | MEDICATED_PAD | Freq: Every day | CUTANEOUS | Status: DC
  Administered 2020-04-12 – 2020-04-17 (×5): 6 via TOPICAL

## 2020-04-12 MED ORDER — METOPROLOL TARTRATE 12.5 MG HALF TABLET
12.5000 mg | ORAL_TABLET | Freq: Four times a day (QID) | ORAL | Status: DC
  Administered 2020-04-12: 12.5 mg via ORAL
  Filled 2020-04-12: qty 1

## 2020-04-12 MED ORDER — ASPIRIN EC 81 MG PO TBEC
81.0000 mg | DELAYED_RELEASE_TABLET | Freq: Every day | ORAL | Status: DC
  Administered 2020-04-13 – 2020-04-16 (×3): 81 mg via ORAL
  Filled 2020-04-12 (×3): qty 1

## 2020-04-12 MED ORDER — ATORVASTATIN CALCIUM 40 MG PO TABS
40.0000 mg | ORAL_TABLET | Freq: Every day | ORAL | Status: DC
  Administered 2020-04-12 – 2020-04-16 (×4): 40 mg via ORAL
  Filled 2020-04-12 (×4): qty 1

## 2020-04-12 MED ORDER — AMIODARONE HCL IN DEXTROSE 360-4.14 MG/200ML-% IV SOLN
30.0000 mg/h | INTRAVENOUS | Status: DC
  Administered 2020-04-12 – 2020-04-13 (×2): 30 mg/h via INTRAVENOUS
  Filled 2020-04-12: qty 200

## 2020-04-12 MED ORDER — HEPARIN (PORCINE) 25000 UT/250ML-% IV SOLN
800.0000 [IU]/h | INTRAVENOUS | Status: DC
  Administered 2020-04-12 – 2020-04-13 (×2): 700 [IU]/h via INTRAVENOUS
  Administered 2020-04-15: 1000 [IU]/h via INTRAVENOUS
  Administered 2020-04-16: 750 [IU]/h via INTRAVENOUS
  Administered 2020-04-17: 800 [IU]/h via INTRAVENOUS
  Filled 2020-04-12 (×5): qty 250

## 2020-04-12 MED ORDER — AMIODARONE HCL IN DEXTROSE 360-4.14 MG/200ML-% IV SOLN
60.0000 mg/h | INTRAVENOUS | Status: AC
  Administered 2020-04-12 (×2): 60 mg/h via INTRAVENOUS
  Filled 2020-04-12: qty 200

## 2020-04-12 MED ORDER — INSULIN ASPART 100 UNIT/ML ~~LOC~~ SOLN
0.0000 [IU] | Freq: Three times a day (TID) | SUBCUTANEOUS | Status: DC
  Administered 2020-04-12: 2 [IU] via SUBCUTANEOUS
  Administered 2020-04-12: 1 [IU] via SUBCUTANEOUS
  Administered 2020-04-12: 3 [IU] via SUBCUTANEOUS
  Administered 2020-04-13 (×3): 2 [IU] via SUBCUTANEOUS
  Administered 2020-04-14 (×2): 3 [IU] via SUBCUTANEOUS
  Administered 2020-04-14: 5 [IU] via SUBCUTANEOUS
  Administered 2020-04-15: 2 [IU] via SUBCUTANEOUS
  Administered 2020-04-15: 1 [IU] via SUBCUTANEOUS
  Administered 2020-04-16: 5 [IU] via SUBCUTANEOUS
  Administered 2020-04-16 – 2020-04-17 (×2): 3 [IU] via SUBCUTANEOUS

## 2020-04-12 MED ORDER — NITROGLYCERIN 0.4 MG SL SUBL
0.4000 mg | SUBLINGUAL_TABLET | SUBLINGUAL | Status: DC | PRN
Start: 2020-04-12 — End: 2020-04-17

## 2020-04-12 MED ORDER — NITROGLYCERIN 1 MG/10 ML FOR IR/CATH LAB
INTRA_ARTERIAL | Status: AC
  Filled 2020-04-12: qty 10

## 2020-04-12 MED ORDER — MILRINONE LACTATE IN DEXTROSE 20-5 MG/100ML-% IV SOLN
0.1250 ug/kg/min | INTRAVENOUS | Status: DC
  Administered 2020-04-12 – 2020-04-15 (×5): 0.25 ug/kg/min via INTRAVENOUS
  Administered 2020-04-16 – 2020-04-20 (×3): 0.125 ug/kg/min via INTRAVENOUS
  Filled 2020-04-12 (×8): qty 100

## 2020-04-12 MED ORDER — ASPIRIN 81 MG PO CHEW
324.0000 mg | CHEWABLE_TABLET | Freq: Once | ORAL | Status: AC
  Administered 2020-04-12: 324 mg via ORAL
  Filled 2020-04-12: qty 4

## 2020-04-12 MED ORDER — AMIODARONE HCL IN DEXTROSE 360-4.14 MG/200ML-% IV SOLN
INTRAVENOUS | Status: AC
  Administered 2020-04-12: 150 mg via INTRAVENOUS
  Filled 2020-04-12: qty 200

## 2020-04-12 MED ORDER — FUROSEMIDE 10 MG/ML IJ SOLN
40.0000 mg | Freq: Every day | INTRAMUSCULAR | Status: DC
  Administered 2020-04-12: 40 mg via INTRAVENOUS
  Filled 2020-04-12: qty 4

## 2020-04-12 MED ORDER — SODIUM CHLORIDE 0.9% FLUSH: 10.0000 mL | INTRAVENOUS | Status: DC | PRN

## 2020-04-12 MED ORDER — MORPHINE SULFATE (PF) 2 MG/ML IV SOLN
1.0000 mg | Freq: Once | INTRAVENOUS | Status: AC
  Administered 2020-04-12: 1 mg via INTRAVENOUS
  Filled 2020-04-12: qty 1

## 2020-04-12 MED ORDER — AMIODARONE LOAD VIA INFUSION
150.0000 mg | Freq: Once | INTRAVENOUS | Status: AC
  Filled 2020-04-12: qty 83.34

## 2020-04-12 MED ORDER — VERAPAMIL HCL 2.5 MG/ML IV SOLN
INTRAVENOUS | Status: AC
  Filled 2020-04-12: qty 2

## 2020-04-12 MED ORDER — MAGNESIUM SULFATE 4 GM/100ML IV SOLN
4.0000 g | Freq: Once | INTRAVENOUS | Status: AC
  Administered 2020-04-12: 4 g via INTRAVENOUS
  Filled 2020-04-12: qty 100

## 2020-04-12 MED ORDER — LIDOCAINE HCL (PF) 1 % IJ SOLN
INTRAMUSCULAR | Status: AC
  Filled 2020-04-12: qty 30

## 2020-04-12 MED ORDER — ACETAMINOPHEN 325 MG PO TABS
650.0000 mg | ORAL_TABLET | ORAL | Status: DC | PRN
  Administered 2020-04-13 – 2020-04-14 (×3): 650 mg via ORAL
  Filled 2020-04-12 (×3): qty 2

## 2020-04-12 MED ORDER — TICAGRELOR 90 MG PO TABS
90.0000 mg | ORAL_TABLET | Freq: Two times a day (BID) | ORAL | Status: DC
  Administered 2020-04-12 – 2020-04-16 (×9): 90 mg via ORAL
  Filled 2020-04-12 (×9): qty 1

## 2020-04-12 MED ORDER — SODIUM CHLORIDE 0.9 % IV BOLUS
500.0000 mL | Freq: Once | INTRAVENOUS | Status: AC
  Administered 2020-04-12: 500 mL via INTRAVENOUS

## 2020-04-12 MED ORDER — HEPARIN SODIUM (PORCINE) 5000 UNIT/ML IJ SOLN
60.0000 [IU]/kg | Freq: Once | INTRAMUSCULAR | Status: AC
  Administered 2020-04-12: 3550 [IU] via INTRAVENOUS
  Filled 2020-04-12: qty 1

## 2020-04-12 MED ORDER — METOPROLOL TARTRATE 12.5 MG HALF TABLET: 12.5000 mg | ORAL_TABLET | Freq: Two times a day (BID) | ORAL | Status: DC

## 2020-04-12 MED ORDER — HEPARIN (PORCINE) IN NACL 1000-0.9 UT/500ML-% IV SOLN
INTRAVENOUS | Status: AC
  Filled 2020-04-12: qty 1000

## 2020-04-12 MED ORDER — SODIUM CHLORIDE 0.9% FLUSH
10.0000 mL | Freq: Two times a day (BID) | INTRAVENOUS | Status: DC
  Administered 2020-04-12 – 2020-04-20 (×9): 10 mL

## 2020-04-12 NOTE — ED Notes (Signed)
This nurse called lab to check up on the status of pt's Troponin test. Lab told nurse results over the phone. Results were critical. Nurse notified multiple MDs.

## 2020-04-12 NOTE — Progress Notes (Signed)
ANTICOAGULATION CONSULT NOTE - Initial Consult  Pharmacy Consult for Heparin Indication: chest pain/ACS  No Known Allergies  Patient Measurements: Height: _0  (157.5 cm) Weight: 59 kg (130 lb) IBW/kg (Calculated) : 50.1    Vital Signs: Temp: 97.4 F (36.3 C) (03/26 0037) Temp Source: Oral (03/25 2200) BP: 111/89 (03/26 0042) Pulse Rate: 65 (03/26 0042)  Labs: Recent Labs    04/10/2020 2134 04/05/2020 2217  HGB 11.6*  --   HCT 37.0  --   PLT 277  --   APTT 29  --   LABPROT 12.6  --   INR 1.0  --   CREATININE 1.88*  --   TROPONINIHS  --  >397,953*    Estimated Creatinine Clearance: 16.7 mL/min (A) (by C-G formula based on SCr of 1.88 mg/dL (H)).   Medical History: Past Medical History:  Diagnosis Date  . Diabetes mellitus without complication ([place])   . Hypercholesteremia   . Hypertension    benign  . Memory loss   . PAF (paroxysmal atrial fibrillation) ([place])   . Vitamin B 12 deficiency     Medications:  No current facility-administered medications on file prior to encounter.   Current Outpatient Medications on File Prior to Encounter  Medication Sig Dispense Refill  . atenolol (TENORMIN) 50 MG tablet Take 50 mg by mouth daily.  4  . chlorthalidone (HYGROTON) 25 MG tablet Take 1/2 tablet daily. (Patient taking differently: Take 12.5 mg by mouth daily as needed (leg swelling).) 45 tablet 3  . enalapril (VASOTEC) 10 MG tablet Take 1 tablet (10 mg total) by mouth once for 1 dose. (Patient taking differently: Take 10 mg by mouth daily.) 90 tablet 3  . gabapentin (NEURONTIN) 300 MG capsule Take 300 mg by mouth 3 (three) times daily.    . metFORMIN (GLUCOPHAGE) 500 MG tablet Take 1,000 mg by mouth 2 (two) times daily with a meal.  3  . pravastatin (PRAVACHOL) 40 MG tablet Take 40 mg by mouth daily.  4  . Blood Glucose Calibration (ONETOUCH VERIO) SOLN     . Blood Glucose Monitoring Suppl ([place] FLEX SYSTEM) w/Device KIT     . ONETOUCH VERIO test strip 1  each daily.       Assessment: 87 y.o. female admitted with STEMI for heparin.  Heparin 3550 units IV bolus given at 0015 Goal of Therapy:  Heparin level 0.3-0.7 units/ml Monitor platelets by anticoagulation protocol: Yes   Plan:  Start heparin 700 units/hr Check heparin level in 8 hours.   Caryl Pina 04/14/2020,1:15 AM

## 2020-04-12 NOTE — ED Notes (Signed)
Daughter has gone home

## 2020-04-12 NOTE — Progress Notes (Signed)
Patient screaming out in pain.  She c/o pain everywhere.  BP 108/91 AFIB 120s.  Paged cardiology

## 2020-04-12 NOTE — Progress Notes (Signed)
  Echocardiogram 2D Echocardiogram has been performed.  Jamie Mueller 03/30/2020, 9:24 AM

## 2020-04-12 NOTE — Progress Notes (Signed)
ANTICOAGULATION CONSULT NOTE - Follow Up Consult  Pharmacy Consult for heparin Indication: STEMI/Afib  Labs: Recent Labs    04/15/2020 2134 04/03/2020 2217 05-06-2020 0238 05-06-20 0602 2020/05/06 0818 05/06/20 1837  HGB 11.6*  --  11.5*  --   --   --   HCT 37.0  --  36.8  --   --   --   PLT 277  --  254  --   --   --   APTT 29  --   --   --   --   --   LABPROT 12.6  --   --   --   --   --   INR 1.0  --   --   --   --   --   HEPARINUNFRC  --   --   --   --  0.37 0.40  CREATININE 1.88*  --  2.03*  --   --   --   TROPONINIHS  --  >135,540* >27,000* >27,000*  --   --     Assessment/Plan:  87yo female remains therapeutic on heparin. Will continue gtt at current rate of 700 units/hr and monitor daily level.   Vernard Gambles, PharmD, BCPS  05/06/20,7:09 PM

## 2020-04-12 NOTE — Progress Notes (Signed)
New orders received.  Amio bolus infusing.  Administered lasix 40mg  IV

## 2020-04-12 NOTE — H&P (Addendum)
CARDIOLOGY ADMISSION NOTE  Patient ID: Jamie Mueller MRN: [id number] DOB/AGE: December 29, 1930 87 y.o.  Admit date: 03/24/2020 Primary Physician   Seward Carol, MD Primary Cardiologist   Minus Breeding, MD Chief Complaint    Abdominal Pain  HPI:   Jamie Mueller is a 87 year old woman with no previous coronary artery disease and a history of diabetes, hyperlipidemia, hypertension, atrial fibrillation (not on anticoagulation), and early stage dementia who presents to the ED with abdominal pain for approximately 24 hours and was ultimately found to have inferolateral ST elevation with marked troponin elevation and no subjective chest pain overall concerning for STEMI.  History is limited given patient's cognitive deficits/memory issues and is primarily obtained from ED records and collateral from patient's daughter at the bedside. She is unable to remember exactly when her symptoms started, but per family [name] first started reporting abdominal pain the day prior to her presentation.  The evening of her presentation, she reported to her son who lives with her that she was not feeling well. When prompted further, she stated that she was having severe abdominal pain. She denied any other acute issues at that time, with no associated chest pain, exertional chest pressure/discomfort, dyspnea/tachypnea, paroxysmal nocturnal dyspnea/orthopnea, irregular heart beat/palpitations, presyncope/syncope, lower extremity edema, claudication, or abdominal distention. After conferring with his sister, the son brought Jamie Mueller to [facility] ED for evaluation.   In the ED, she was found to be AF with RVR but otherwise hemodynamically stable. She denied any active pain at that time but abdominal pain was elicited on physical exam. Her EKG showed evidence of mild inferolateral ST elevation without reciprocal changes. High sensitivity troponin subsequently came back markedly elevated at > 130,000. She was given full  dose aspirin, a bolus of heparin, and 2.54m of IV metoprolol and Code STEMI was called.  [facility] with RVR, but was otherwise comfortable and stable from a vitals standpoint. She denied any acute issues or concerns and was pleasantly smiling and intermittently stated that "I want to go home".  .    Past Medical History:  Diagnosis Date   Diabetes mellitus without complication (HCC)    Hypercholesteremia    Hypertension    benign   Memory loss    PAF (paroxysmal atrial fibrillation) ([place]    Vitamin B 12 deficiency     Past Surgical History:  Procedure Laterality Date   ABDOMINAL HYSTERECTOMY     CATARACT EXTRACTION Left 03/2015    No Known Allergies No current facility-administered medications on file prior to encounter.   Current Outpatient Medications on File Prior to Encounter  Medication Sig Dispense Refill   atenolol (TENORMIN) 50 MG tablet Take 50 mg by mouth daily.  4   chlorthalidone (HYGROTON) 25 MG tablet Take 1/2 tablet daily. (Patient taking differently: Take 12.5 mg by mouth daily as needed (leg swelling).) 45 tablet 3   enalapril (VASOTEC) 10 MG tablet Take 1 tablet (10 mg total) by mouth once for 1 dose. (Patient taking differently: Take 10 mg by mouth daily.) 90 tablet 3   gabapentin (NEURONTIN) 300 MG capsule Take 300 mg by mouth 3 (three) times daily.     metFORMIN (GLUCOPHAGE) 500 MG tablet Take 1,000 mg by mouth 2 (two) times daily with a meal.  3   pravastatin (PRAVACHOL) 40 MG tablet Take 40 mg by mouth daily.  4   Blood Glucose Calibration (ONETOUCH VERIO) SOLN  Blood Glucose Monitoring Suppl (ONETOUCH VERIO FLEX SYSTEM) w/Device KIT      ONETOUCH VERIO test strip 1 each daily.     Social History   Socioeconomic History   Marital status: Married    Spouse name: Not on file   Number of children: Not on file   Years of education: Not on file   Highest education level: Not on file  Occupational  History   Not on file  Tobacco Use   Smoking status: Never Smoker   Smokeless tobacco: Never Used  Vaping Use   Vaping Use: Never used  Substance and Sexual Activity   Alcohol use: No    Alcohol/week: 0.0 standard drinks   Drug use: No   Sexual activity: Not on file  Other Topics Concern   Not on file  Social History Narrative   Not on file   Social Determinants of Health   Financial Resource Strain: Not on file  Food Insecurity: Not on file  Transportation Needs: Not on file  Physical Activity: Not on file  Stress: Not on file  Social Connections: Not on file  Intimate Partner Violence: Not on file    Family History  Problem Relation Age of Onset   Cancer Father        lung     Review of Systems: [y] = yes, _0  = no      General: Weight gain _1 ; Weight loss _2 ; Anorexia _3 ; Fatigue _4 ; Fever _5 ; Chills _6 ; Weakness _7    Cardiac: Chest pain/pressure _8 ; Resting SOB _9 ; Exertional SOB _10 ; Orthopnea _11 ; Pedal Edema _12 ; Palpitations _13 ; Syncope _14 ; Presyncope _15 ; Paroxysmal nocturnal dyspnea_16    Pulmonary: Cough _17 ; Wheezing_18 ; Hemoptysis_19 ; Sputum _20 ; Snoring _21    GI: Vomiting_22 ; Dysphagia_23 ; Melena_24 ; Hematochezia _25 ; Heartburn_26 ; Abdominal pain Blue.Reese ]; Constipation _27 ; Diarrhea _28 ; BRBPR _29    GU: Hematuria_30 ; Dysuria _31 ; Nocturia_32  Vascular: Pain in legs with walking _33 ; Pain in feet with lying flat _34 ; Non-healing sores _35 ; Stroke _36 ; TIA _37 ; Slurred speech _38 ;   Neuro: Headaches_39 ; Vertigo_40 ; Seizures_41 ; Paresthesias_42 ;Blurred vision _43 ; Diplopia _44 ; Vision changes _45    Ortho/Skin: Arthritis _46 ; Joint pain _47 ; Muscle pain _48 ; Joint swelling _49 ; Back Pain _50 ; Rash _51    Psych: Depression_52 ; Anxiety_53    Heme: Bleeding problems _54 ; Clotting disorders _55 ; Anemia _56    Endocrine: Diabetes _57 ; Thyroid dysfunction_58   Physical Exam: Blood pressure 111/89, pulse 65, temperature (!) 97.4 F (36.3 C), resp. rate 17, height  _59  (1.575 m), weight 59 kg, SpO2 (!) 86 %.   GENERAL: Patient is afebrile, Vital signs reviewed, Well appearing, Patient appears comfortable, Alert to person and place, but not situation or time.  EYES: Normal inspection. HEENT:  normocephalic, atraumatic , normal ENT inspection. ORAL:  Moist NECK:  supple , normal inspection. CARD:  Tachy, irregularly irregular rhythm, heart sounds normal. RESP:  no respiratory distress, breath sounds normal. ABD: soft, diffusely tender to palpation, BS present, no organomegaly or masses . MUSC:  normal ROM, non-tender , no pedal edema . SKIN: color normal, no rash, warm, dry . NEURO: awake & alert, moves all extremities well, no focal deficits. PSYCH: mood/affect normal.   Labs: Lab Results  Component Value Date   BUN 23 03/21/2020   Lab Results  Component Value Date   CREATININE 1.88 (H) 03/18/2020   Lab Results  Component Value Date   NA 138 03/24/2020   K 4.4 03/18/2020   CL 104 04/09/2020   CO2 25 03/30/2020   Lab Results  Component Value Date   TROPONINI 0.06 ([place]) 03/31/2017   Lab Results  Component Value Date   WBC 6.5 03/24/2020   HGB 11.6 (L) 03/30/2020   HCT 37.0 03/25/2020   MCV 85.3 04/17/2020   PLT 277 03/26/2020   Lab Results  Component Value Date   CHOL  06/02/2009    169        ATP III CLASSIFICATION:  <200     mg/dL   Desirable  200-239  mg/dL   Borderline High  >=240    mg/dL   High          HDL 55 06/02/2009   LDLCALC  06/02/2009    96        Total Cholesterol/HDL:CHD Risk Coronary Heart Disease Risk Table                     Men   Women  1/2 Average Risk   3.4   3.3  Average Risk       5.0   4.4  2 X Average Risk   9.6   7.1  3 X Average Risk  23.4   11.0        Use the calculated Patient Ratio above and the CHD Risk Table to determine the patient's CHD Risk.        ATP III CLASSIFICATION (LDL):  <100     mg/dL   Optimal  100-129  mg/dL   Near or Above                    Optimal  130-159   mg/dL   Borderline  160-189  mg/dL   High  >190     mg/dL   Very High   TRIG 89 06/02/2009   CHOLHDL 3.1 06/02/2009   Lab Results  Component Value Date   ALT 55 (H) 04/01/2020   AST 467 (H) 04/07/2020   ALKPHOS 75 03/25/2020   BILITOT 0.7 03/20/2020      Radiology:  CXR: Chronic cardiomegaly without acute abnormality.   CT Abdomen/Pelvis:  IMPRESSION: 1. No acute findings in the abdomen or pelvis. 2. Colonic diverticulosis without findings of acute diverticulitis. 3. Aortic atherosclerosis.  EKG:  Reviewed and described above   ASSESSMENT AND PLAN:   Ms. Dunkel is a 87 year old woman with no previous coronary artery disease and a history of diabetes, hyperlipidemia, hypertension, atrial fibrillation (not on anticoagulation), and early stage dementia who presents to the ED with abdominal pain for approximately 24 hours and was ultimately found to have inferolateral ST elevation with marked troponin elevation and no subjective chest pain overall concerning for STEMI.  Patient with overall atypical clinical presentation with marked troponin elevation and not overly impressive EKG changes. She continues to deny chest pain and has not had chest pain at any point during her course. She has however had abdominal pain which could be an anginal equivalent in this elderly woman with dementia. Fortunately, aside from AF with RVR she is stable without signs of hemodynamic compromise.   Interventional cardiology discussed the pros and cons of taking patient urgently to the cath lab with Ms. Narayanan and her  daughter. Given her symptom onset is presumed to be over 24 hours ago, her lack of overt/ongoing chest pain or other discomfort, her stable vital signs, as well as cognitive deficits we opted for conservative management as outlined below. Patient and her daughter were in complete agreement with this course of action.    # STEMI # HTN # HLD - Start heparin IV infusion protocol - Symptom  management with Nitro prn - Trend trop and serial ECGs - TTE in AM - Aggressive risk factor modification with glycemic control, smoking cessation, and GDMT (to be started once more objective data obtained             - Lipid panel and A1c pending  - Start fractionated metoprolol (substituted for home atenolol)  - Start atorvastatin 65m daily (substituted for home pravastatin)  # Paroxysmal AF - Heparin gtt as above for stroke/systemic embolism prophylaxis - Fractionated Metoprolol for rate control - Monitor on telemetry - Replete and maintain electrolytes   # DMII - Check A1c - Hold oral agents for now - SSI ordered  # Early Stage Dementia - Continue to monitor clinically     Signed: FClois Dupes3/26/2022, 1:12 AM  Agree with assessment and plan of Dr UVickki Muffas outlined above  89y/o AAF with late presentation inferolateral STEMI,  atyp Sx of intermittent abd pain but no CP,  Afib with RVR, and moderate renal insufficiency along with cognitive impairment/ dementia. After conferring with Cards Fellow and pts Daughter at bedside I felt that conservative Rx was the best option and everyone was in agreement. Plan IV hep, rate control, 2D echo. Pt was on Eliquis in the past but apparently not currently taking. Not sure  she is optimal anticoag candidate.  JLorretta Harp M.D., FBath FMem[facility] FLaverta BaltimoreFBurgess[address] [place]  [id number] [phone]/26/2022 8:44 AM

## 2020-04-12 NOTE — Progress Notes (Signed)
Patient's daughter at bedside.  Updated daughter.

## 2020-04-12 NOTE — Progress Notes (Signed)
PICC line team placed PICC and Radiology at bedside to confirm.

## 2020-04-12 NOTE — Progress Notes (Addendum)
Progress Note  Patient Name: Jamie Mueller Date of Encounter: 04/02/2020  [facility] Cardiologist: Rollene Rotunda, MD    Subjective   Denies chest pain and dyspnea, but RN reports she became easily tachypneic with minimal moving. Abdomen a little uncomfortable, but she can't provide details. Oliguric, zero urine on bladder scan. Afib with RVR overnight.  Inpatient Medications    Scheduled Meds: . [START ON 04/13/2020] aspirin EC  81 mg Oral Daily  . atorvastatin  40 mg Oral Daily  . insulin aspart  0-9 Units Subcutaneous TID WC  . metoprolol tartrate  12.5 mg Oral Q6H   Continuous Infusions: . heparin 700 Units/hr (04/05/2020 0600)  . magnesium sulfate bolus IVPB 4 g (04/03/2020 0703)   PRN Meds: acetaminophen, nitroGLYCERIN   Vital Signs    Vitals:   03/18/2020 0500 04/15/2020 0600 04/07/2020 0624 03/25/2020 0641  BP:   107/66   Pulse: (!) 132 88 (!) 126   Resp: (!) 28 (!) 34    Temp:    (!) 97.3 F (36.3 C)  TempSrc:    Oral  SpO2: 100% 97%    Weight:      Height:        Intake/Output Summary (Last 24 hours) at 04/03/2020 0805 Last data filed at 03/24/2020 0600 Gross per 24 hour  Intake 22.69 ml  Output -  Net 22.69 ml   Last 3 Weights 04/09/2020 10/26/2018 08/08/2017  Weight (lbs) 130 lb (No Data) 130 lb  Weight (kg) 58.968 kg (No Data) 58.968 kg      Telemetry    AFib w RVR - Personally Reviewed  ECG    AFib with RVR and coarse AFib waves, very low QRS voltage. Extensive mild anterolateral ST elevation is actually significant when corrected for the low QRS voltage - Personally Reviewed  Physical Exam  Appears comfortable GEN: No acute distress.   Neck: 4-5 cm JVD Cardiac: RRR, no murmurs, rubs. S3 present . Warm hands and feet. Respiratory: Clear to auscultation bilaterally. GI: Soft, nontender, non-distended  MS: No edema; No deformity. Neuro:  Nonfocal  Psych: Normal affect   Labs    High Sensitivity Troponin:   Recent Labs  Lab  03/19/2020 2217 04/05/2020 0238 04/07/2020 0602  TROPONINIHS >135,540* >27,000* >27,000*      Chemistry Recent Labs  Lab 03/24/2020 2134 04/15/2020 0238  NA 138 141  K 4.4 4.5  CL 104 104  CO2 25 24  GLUCOSE 239* 210*  BUN 23 24*  CREATININE 1.88* 2.03*  CALCIUM 8.6* 8.2*  PROT 6.9  --   ALBUMIN 3.3*  --   AST 467*  --   ALT 55*  --   ALKPHOS 75  --   BILITOT 0.7  --   GFRNONAA 26* 23*  ANIONGAP 9 13     Hematology Recent Labs  Lab 04/13/2020 2134 04/12/20 0238  WBC 6.5 6.6  RBC 4.34 4.31  HGB 11.6* 11.5*  HCT 37.0 36.8  MCV 85.3 85.4  MCH 26.7 26.7  MCHC 31.4 31.3  RDW 14.1 14.2  PLT 277 254    BNPNo results for input(s): BNP, PROBNP in the last 168 hours.   DDimer No results for input(s): DDIMER in the last 168 hours.   Radiology    CT ABDOMEN PELVIS WO CONTRAST  Result Date: 03/21/2020 CLINICAL DATA:  Right upper quadrant pain that radiates to back. EXAM: CT ABDOMEN AND PELVIS WITHOUT CONTRAST TECHNIQUE: Multidetector CT imaging of the abdomen and pelvis was performed  following the standard protocol without IV contrast. COMPARISON:  None. FINDINGS: Lower chest: Bibasilar atelectasis. Hepatobiliary: Unremarkable noncontrast appearance of the hepatic parenchyma. Gallbladder surgically absent. No biliary ductal dilation. Pancreas: Unremarkable. Spleen: Unremarkable. Adrenals/Urinary Tract: Bilateral adrenal glands are unremarkable. No hydronephrosis. No nephrolithiasis. Urinary bladder is grossly unremarkable for degree of distension. Stomach/Bowel: Stomach is grossly unremarkable for degree of distension. No small bowel dilation. Appendix is visualized in the right lower quadrant and appears unremarkable. Scattered colonic diverticula without findings acute diverticulitis. Vascular/Lymphatic: Aortic atherosclerosis. No enlarged abdominal or pelvic lymph nodes. Reproductive: Status post hysterectomy. No adnexal masses. Other: No abdominopelvic ascites. Musculoskeletal: Mild  levoconvex curvature of the spine multilevel degenerative changes spine. Grade 1 degenerative anterolisthesis of L4 on L5. Degenerative change of the hips, SI joints and pubic symphysis. IMPRESSION: 1. No acute findings in the abdomen or pelvis. 2. Colonic diverticulosis without findings of acute diverticulitis. 3. Aortic atherosclerosis. Aortic Atherosclerosis (ICD10-I70.0). Electronically Signed   By: Maudry Mayhew MD   On: 04/02/2020 23:57   DG Chest Portable 1 View  Result Date: 03/27/2020 CLINICAL DATA:  Dyspnea. EXAM: PORTABLE CHEST 1 VIEW COMPARISON:  Remote exam 04/29/2003 FINDINGS: Chronic cardiomegaly unchanged. Normal mediastinal contours. No acute or focal airspace disease. No pulmonary edema, pleural effusion, or pneumothorax. Surgical clips in the left thoracic inlet. Mild degenerative change of the left shoulder. Scoliotic curvature of the lower thoracic spine. IMPRESSION: Chronic cardiomegaly without acute abnormality. Electronically Signed   By: Narda Rutherford M.D.   On: 04/06/2020 22:51    Cardiac Studies   ECHO 2019 - Left ventricle: The cavity size was normal. There was moderate  concentric hypertrophy. Systolic function was normal. The  estimated ejection fraction was in the range of 60% to 65%. Wall  motion was normal; there were no regional wall motion  abnormalities.  - Aortic valve: Trileaflet; normal thickness, mildly calcified  leaflets.  - Mitral valve: There was mild regurgitation.  - Left atrium: The atrium was mildly dilated.  - Pulmonary arteries: Systolic pressure was mildly increased. PA  peak pressure: 40 mm Hg (S).  - Pericardium, extracardiac: A small pericardial effusion was  identified.   Patient Profile     87 y.o. female with DM, HTN, hx of paroxysmal atrial fibrillation and early dementia with delayed presentation of anterolateral STEMI, now with oliguria and AFib w RVR, being treated conservatively  Assessment & Plan    1.  STEMI: suspect proximal LAD stenosis related infarction. Delayed presentation, AST was already markedly increased at presentation. Very high and sustained troponin level. Anticipate LVEF will be 35% or lower. Echo pending. Some signs worrisome for low CO, in particular oliguria, but extremities are warm. Mild JVD elevation, but lungs are clear clinically and on CXR. Cautious with beta blocker dosing. On ASA, statin, heparin. Add ticagrelor. 2. AFib w RVR: May need amio for rate control, but current rate low 100s appears appropriate while we await for echo information. Avoid diltiazem. Not on chronic anticoagulation. On IV heparin. 3. Acute on chronic renal insuff:  Rapidly rising BUN/creat. Baseline uncertain (creat 1.3 in 2019). Poor PO intake. Chronic Rx w ACEi and thiazide. May be "dry" or poor CO due to AMI/impending shock. Will give a small fluid bolus and watch for response while we await the echo. Hold metformin/ACEi/thiazide diuretic for now. 4. HTN: low normal range. BP currently in low normal range. 5. DM: well controlled, A1c 6.7%. 6. HypoMg: likely due to thiazide. Replete.  CRITICAL CARE Performed by: Rachelle Hora Croitoru  Total critical care time: 45 minutes  Critical care time was exclusive of separately billable procedures and treating other patients.  Critical care was necessary to treat or prevent imminent or life-threatening deterioration.  Critical care was time spent personally by me on the following activities: development of treatment plan with patient and/or surrogate as well as nursing, discussions with consultants, evaluation of patient's response to treatment, examination of patient, obtaining history from patient or surrogate, ordering and performing treatments and interventions, ordering and review of laboratory studies, ordering and review of radiographic studies, pulse oximetry and re-evaluation of patient's condition.   For questions or updates, please contact [facility] Please consult [email or web address] for contact info under        Signed, Thurmon Fair, MD  03/21/2020, 8:05 AM     ADDENDUM:  Bedside echo review confirms suspicion of extensive anterior MI - entire LAD territory is akinetic. In addition there are findings that strongly support a diagnosis of cardiac amyloidosis (marked concentric LVH on echo w low QRS voltage on ECG, thickened RV wall as well, atrial dilation, small pericardial effusion). The IVC is plethoric. Although diastolic function is difficult to evaluate w AFib, the mitral annulus diastolic velocities are extremely low, the mitral inflow deceleration time is markedly short/restrictive, cardiac stroke volume is very low.  Will add IV milrinone. If RVR worsens, use amio for rate control, but avoid bradycardia as her CO is HR dependent.  Thurmon Fair, MD, [facility] [phone] office [phone] pager 04/06/2020  9:36 AM

## 2020-04-12 NOTE — ED Provider Notes (Signed)
Received as a transfer from [facility].  Patient pending heart catheterization, sent to the emergency department awaiting Cath Lab staff arrival.  At arrival to the emergency department, patient is comfortable and stable.   Gilda Crease, MD 2020/05/07 [phone]

## 2020-04-12 NOTE — Plan of Care (Signed)

## 2020-04-12 NOTE — Progress Notes (Signed)
Echo at bedside

## 2020-04-12 NOTE — Progress Notes (Addendum)
Paged cardiology.  Patient now in AFIB 120s.  She is now SOB when talking.  BP 129/86 Map 101 HR 120s-130s Oxygen 96% RA and c/o abd pain.

## 2020-04-12 NOTE — Progress Notes (Signed)
Peripherally Inserted Central Catheter Placement  The IV Nurse has discussed with the patient and/or persons authorized to consent for the patient, the purpose of this procedure and the potential benefits and risks involved with this procedure.  The benefits include less needle sticks, lab draws from the catheter, and the patient may be discharged home with the catheter. Risks include, but not limited to, infection, bleeding, blood clot (thrombus formation), and puncture of an artery; nerve damage and irregular heartbeat and possibility to perform a PICC exchange if needed/ordered by physician.  Alternatives to this procedure were also discussed.  Bard Power PICC patient education guide, fact sheet on infection prevention and patient information card has been provided to patient /or left at bedside.  Consent signed by son due to altered mental status (dementia).  PICC Placement Documentation  PICC Double Lumen 04/01/2020 PICC Right Brachial 35 cm 0 cm (Active)  Indication for Insertion or Continuance of Line Prolonged intravenous therapies;Vasoactive infusions 04/06/2020 1208  Exposed Catheter (cm) 0 cm 03/31/2020 1208  Site Assessment Clean;Dry;Intact 04/09/2020 1208  Lumen #1 Status Flushed;Saline locked;Blood return noted 04/07/2020 1208  Lumen #2 Status Flushed;Saline locked;Blood return noted 04/17/2020 1208  Dressing Type Transparent 03/26/2020 1208  Dressing Status Clean;Dry;Intact 03/28/2020 1208  Antimicrobial disc in place? Yes 04/05/2020 1208  Safety Lock Not Applicable 04/03/2020 1208  Line Care Connections checked and tightened 03/29/2020 1208  Line Adjustment (NICU/IV Team Only) No 03/27/2020 1208  Dressing Intervention New dressing 03/22/2020 1208  Dressing Change Due 04/19/20 04/03/2020 1208       Terre Zabriskie, Lajean Manes 03/26/2020, 12:10 PM

## 2020-04-12 NOTE — Progress Notes (Signed)
Patient states she feels much better

## 2020-04-12 NOTE — Progress Notes (Signed)
NP at bedside.

## 2020-04-12 NOTE — ED Notes (Signed)
Report given  To payton rn on 2h

## 2020-04-12 NOTE — Progress Notes (Signed)
New order received.  1 mg morphine IV.

## 2020-04-12 NOTE — Progress Notes (Signed)
Dr. Ashley Royalty returned page.  No new orders received.

## 2020-04-12 NOTE — ED Notes (Signed)
A covid swab has already been sent tonight from [facility] ed

## 2020-04-12 NOTE — Progress Notes (Signed)
    Called to the bedside by RN with increased HR, now sustaining the in 130s. Remains in Afib. Patient c/o vague symptoms, headache, chest/abd discomfort. BP in the 120s systolic.  General: Thin, older female. Appears uncomfortable in the bed. Head: Normocephalic, atraumatic.  Neck: Supple without bruits, + JVD Lungs:  Resp regular and unlabored, CTA. Heart: Irreg Irreg, S1, S2, no murmur; no rub. Abdomen: Soft, non-tender, non-distended. Extremities: No clubbing, cyanosis, edema. Warm/dry extremities Neuro: Alert and oriented X 3. Moves all extremities spontaneously. Psych: Normal affect.   Coox 48, echo noted EF of 20-25%. Has had about 100cc UOP thus far after being started on milrinone. BB was stopped this morning. Discussed with attending and updated on clinical change.  -- will add IV amiodarone with bolus for rate control -- IV lasix 40mg  daily for now, follow UOP -- BMET, Coox, CBC ordered for morning  Signed, , NP-C 04/14/2020, 3:29 PM

## 2020-04-12 NOTE — Progress Notes (Signed)
Paged Cardiology:   Patient still in AFIB 120s-130s Just had 11 beats of SVT.

## 2020-04-12 NOTE — ED Notes (Signed)
Dr berry has arrived

## 2020-04-12 NOTE — Progress Notes (Signed)
Patient unable to use purwic.  She attempted to use bedside commode.  When she stood she immediately became SOB and RR 38, oxygen on the monitor was 99%  Placed patient back in bed hi fowlers position.  Patient still c/o SOB no CP or abd pain.  Patient recovered.  She was able to use bedpan very scant urine output .  Bladder scan demonstrated 0.  Patient maintains AFIB 109-130s.

## 2020-04-12 NOTE — ED Notes (Signed)
The pt was transferred from [place] ed to here to wait for the cath team to come in.  Pt c/o abd pain. A and o x4    No pain at present  2 ivs  Rt and lt -acs saline locked.

## 2020-04-12 NOTE — Progress Notes (Signed)
ANTICOAGULATION CONSULT NOTE - Initial Consult  Pharmacy Consult for Heparin Indication: chest pain/ACS; also w/ afib  No Known Allergies  Patient Measurements: Height: _0  (157.5 cm) Weight: 59 kg (130 lb) IBW/kg (Calculated) : 50.1    Vital Signs: Temp: 97.3 F (36.3 C) (03/26 0641) Temp Source: Oral (03/26 0641) BP: 97/73 (03/26 0900) Pulse Rate: 107 (03/26 0900)  Labs: Recent Labs    04/01/2020 2134 04/06/2020 2217 04/13/2020 0238 03/18/2020 0602 03/28/2020 0818  HGB 11.6*  --  11.5*  --   --   HCT 37.0  --  36.8  --   --   PLT 277  --  254  --   --   APTT 29  --   --   --   --   LABPROT 12.6  --   --   --   --   INR 1.0  --   --   --   --   HEPARINUNFRC  --   --   --   --  0.37  CREATININE 1.88*  --  2.03*  --   --   TROPONINIHS  --  >135,540* >27,000* >27,000*  --     Estimated Creatinine Clearance: 15.4 mL/min (A) (by C-G formula based on SCr of 2.03 mg/dL (H)).   Medical History: Past Medical History:  Diagnosis Date  . Diabetes mellitus without complication ([place])   . Hypercholesteremia   . Hypertension    benign  . Memory loss   . PAF (paroxysmal atrial fibrillation) ([place])   . Vitamin B 12 deficiency     Medications:  No current facility-administered medications on file prior to encounter.   Current Outpatient Medications on File Prior to Encounter  Medication Sig Dispense Refill  . atenolol (TENORMIN) 50 MG tablet Take 50 mg by mouth daily.  4  . chlorthalidone (HYGROTON) 25 MG tablet Take 1/2 tablet daily. (Patient taking differently: Take 12.5 mg by mouth daily as needed (leg swelling).) 45 tablet 3  . enalapril (VASOTEC) 10 MG tablet Take 1 tablet (10 mg total) by mouth once for 1 dose. (Patient taking differently: Take 10 mg by mouth daily.) 90 tablet 3  . gabapentin (NEURONTIN) 300 MG capsule Take 300 mg by mouth 3 (three) times daily.    . metFORMIN (GLUCOPHAGE) 500 MG tablet Take 1,000 mg by mouth 2 (two) times daily with a meal.  3  .  pravastatin (PRAVACHOL) 40 MG tablet Take 40 mg by mouth daily.  4  . Blood Glucose Calibration (ONETOUCH VERIO) SOLN     . Blood Glucose Monitoring Suppl ([place] FLEX SYSTEM) w/Device KIT     . ONETOUCH VERIO test strip 1 each daily.       Assessment: 87 y.o. female admitted with STEMI for heparin.  Also has atrial fibrillation, not on anticoagulation at admission although reports Eliquis in the past.  Heparin level within goal range today.  No overt bleeding or complications noted.  CBC stable.  Goal of Therapy:  Heparin level 0.3-0.7 units/ml Monitor platelets by anticoagulation protocol: Yes   Plan:  Continue IV heparin at 700 units/hr. Repeat heparin level in 8 hrs Daily heparin level and CBC. F/u plans for heparin x 48 hrs post MI vs chronic anticoagulation for afib.  [place] Crane, Roylene Reason, BCCP Clinical Pharmacist  04/01/2020 9:46 AM   [facility] pharmacy phone numbers are listed on [email or web address]

## 2020-04-13 DIAGNOSIS — I5021 Acute systolic (congestive) heart failure: Secondary | ICD-10-CM | POA: Diagnosis not present

## 2020-04-13 DIAGNOSIS — E119 Type 2 diabetes mellitus without complications: Secondary | ICD-10-CM

## 2020-04-13 DIAGNOSIS — I2102 ST elevation (STEMI) myocardial infarction involving left anterior descending coronary artery: Secondary | ICD-10-CM | POA: Diagnosis not present

## 2020-04-13 DIAGNOSIS — R570 Cardiogenic shock: Secondary | ICD-10-CM

## 2020-04-13 DIAGNOSIS — I4891 Unspecified atrial fibrillation: Secondary | ICD-10-CM | POA: Diagnosis not present

## 2020-04-13 DIAGNOSIS — N183 Chronic kidney disease, stage 3 unspecified: Secondary | ICD-10-CM

## 2020-04-13 DIAGNOSIS — N179 Acute kidney failure, unspecified: Secondary | ICD-10-CM | POA: Diagnosis not present

## 2020-04-13 LAB — BASIC METABOLIC PANEL
Anion gap: 10 (ref 5–15)
BUN: 31 mg/dL — ABNORMAL HIGH (ref 8–23)
CO2: 24 mmol/L (ref 22–32)
Calcium: 7.8 mg/dL — ABNORMAL LOW (ref 8.9–10.3)
Chloride: 101 mmol/L (ref 98–111)
Creatinine, Ser: 2.43 mg/dL — ABNORMAL HIGH (ref 0.44–1.00)
GFR, Estimated: 19 mL/min — ABNORMAL LOW (ref >60)
Glucose, Bld: 291 mg/dL — ABNORMAL HIGH (ref 70–99)
Potassium: 3.7 mmol/L (ref 3.5–5.1)
Sodium: 135 mmol/L (ref 135–145)

## 2020-04-13 LAB — COOXEMETRY PANEL
Carboxyhemoglobin: 0.8 % (ref 0.5–1.5)
Methemoglobin: 1 % (ref 0.0–1.5)
O2 Saturation: 75 %
Total hemoglobin: 9.6 g/dL — ABNORMAL LOW (ref 12.0–16.0)

## 2020-04-13 LAB — GLUCOSE, CAPILLARY
Glucose-Capillary: 169 mg/dL — ABNORMAL HIGH (ref 70–99)
Glucose-Capillary: 207 mg/dL — ABNORMAL HIGH (ref 70–99)
Glucose-Capillary: 175 mg/dL — ABNORMAL HIGH (ref 70–99)
Glucose-Capillary: 138 mg/dL — ABNORMAL HIGH (ref 70–99)

## 2020-04-13 LAB — CBC
HCT: 30.5 % — ABNORMAL LOW (ref 36.0–46.0)
Hemoglobin: 10.1 g/dL — ABNORMAL LOW (ref 12.0–15.0)
MCH: 27.3 pg (ref 26.0–34.0)
MCHC: 33.1 g/dL (ref 30.0–36.0)
MCV: 82.4 fL (ref 80.0–100.0)
Platelets: 221 10*3/uL (ref 150–400)
RBC: 3.7 MIL/uL — ABNORMAL LOW (ref 3.87–5.11)
RDW: 14.1 % (ref 11.5–15.5)
WBC: 9.7 10*3/uL (ref 4.0–10.5)
nRBC: 0 % (ref 0.0–0.2)

## 2020-04-13 LAB — HEPARIN LEVEL (UNFRACTIONATED): Heparin Unfractionated: 0.32 IU/mL (ref 0.30–0.70)

## 2020-04-13 LAB — MAGNESIUM: Magnesium: 2.4 mg/dL (ref 1.7–2.4)

## 2020-04-13 MED ORDER — AMIODARONE HCL IN DEXTROSE 360-4.14 MG/200ML-% IV SOLN
60.0000 mg/h | INTRAVENOUS | Status: DC
  Administered 2020-04-13: 30 mg/h via INTRAVENOUS
  Administered 2020-04-14 – 2020-04-15 (×5): 60 mg/h via INTRAVENOUS
  Filled 2020-04-13: qty 200
  Filled 2020-04-13: qty 400
  Filled 2020-04-13 (×3): qty 200

## 2020-04-13 MED ORDER — AMIODARONE HCL IN DEXTROSE 360-4.14 MG/200ML-% IV SOLN
60.0000 mg/h | INTRAVENOUS | Status: DC
  Administered 2020-04-13: 60 mg/h via INTRAVENOUS
  Filled 2020-04-13: qty 200

## 2020-04-13 NOTE — Plan of Care (Signed)
  Problem: Education: Goal: Knowledge of General Education information will improve Description: Including pain rating scale, medication(s)/side effects and non-pharmacologic comfort measures Outcome: Progressing   Problem: Clinical Measurements: Goal: Ability to maintain clinical measurements within normal limits will improve Outcome: Progressing Goal: Will remain free from infection Outcome: Progressing Goal: Diagnostic test results will improve Outcome: Progressing Goal: Respiratory complications will improve Outcome: Progressing   Problem: Coping: Goal: Level of anxiety will decrease Outcome: Progressing   Problem: Elimination: Goal: Will not experience complications related to bowel motility Outcome: Progressing Goal: Will not experience complications related to urinary retention Outcome: Progressing   Problem: Pain Managment: Goal: General experience of comfort will improve Outcome: Progressing   Problem: Safety: Goal: Ability to remain free from injury will improve Outcome: Progressing   Problem: Skin Integrity: Goal: Risk for impaired skin integrity will decrease Outcome: Progressing   Problem: Health Behavior/Discharge Planning: Goal: Ability to safely manage health-related needs after discharge will improve Outcome: Progressing   Problem: Health Behavior/Discharge Planning: Goal: Ability to manage health-related needs will improve Outcome: Not Progressing   Problem: Clinical Measurements: Goal: Cardiovascular complication will be avoided Outcome: Not Progressing   Problem: Activity: Goal: Risk for activity intolerance will decrease Outcome: Not Progressing   Problem: Nutrition: Goal: Adequate nutrition will be maintained Outcome: Not Progressing   Problem: Cardiac: Goal: Ability to achieve and maintain adequate cardiopulmonary perfusion will improve Outcome: Not Progressing   Problem: Activity: Goal: Ability to tolerate increased activity will  improve Outcome: Not Progressing   Problem: Cardiac: Goal: Ability to achieve and maintain adequate cardiopulmonary perfusion will improve Outcome: Not Progressing

## 2020-04-13 NOTE — Progress Notes (Signed)
Patient attempted to walk to bathroom. Bed alarm not activated by patient's movement, but has been on all night. Patient redirected by nurse into chair due to blood in bed from pulling out all peripheral IVs as well as PICC line.   Peripheral IV inserted by bedside RN and IV team. PICC line checked by IV team to ensure full removal of line.   Patient placed back to bed and remains oriented to only self and date of birth. Has needed reorientation to location and situation multiple times. Reassessment completed with no new findings. Patient resting in bed with no pain.   Cardiology paged at 0612 due to multiple runs of SVT, 9 beat run noted to be the longest.   MD aware of patient's event and SVT runs, no new orders placed.

## 2020-04-13 NOTE — Progress Notes (Signed)
[place] for Heparin Indication: chest pain/ACS; also w/ afib  No Known Allergies  Patient Measurements: Height: 5' 2"  (157.5 cm) Weight: 57.4 kg (126 lb 8.7 oz) IBW/kg (Calculated) : 50.1    Vital Signs: Temp: 98 F (36.7 C) (03/27 0700) BP: 123/69 (03/27 0600) Pulse Rate: 130 (03/27 0600)  Labs: Recent Labs    04/03/2020 2134 03/25/2020 2217 03/22/2020 0238 04/01/2020 0602 04/03/2020 0818 04/10/2020 1837 04/13/20 0418  HGB 11.6*  --  11.5*  --   --   --  10.1*  HCT 37.0  --  36.8  --   --   --  30.5*  PLT 277  --  254  --   --   --  221  APTT 29  --   --   --   --   --   --   LABPROT 12.6  --   --   --   --   --   --   INR 1.0  --   --   --   --   --   --   HEPARINUNFRC  --   --   --   --  0.37 0.40 0.32  CREATININE 1.88*  --  2.03*  --   --   --  2.43*  TROPONINIHS  --  >135,540* >27,000* >27,000*  --   --   --     Estimated Creatinine Clearance: 12.9 mL/min (A) (by C-G formula based on SCr of 2.43 mg/dL (H)).   Medical History: Past Medical History:  Diagnosis Date  . Diabetes mellitus without complication ([place])   . Hypercholesteremia   . Hypertension    benign  . Memory loss   . PAF (paroxysmal atrial fibrillation) ([place])   . Vitamin B 12 deficiency     Medications:  No current facility-administered medications on file prior to encounter.   Current Outpatient Medications on File Prior to Encounter  Medication Sig Dispense Refill  . atenolol (TENORMIN) 50 MG tablet Take 50 mg by mouth daily.  4  . chlorthalidone (HYGROTON) 25 MG tablet Take 1/2 tablet daily. (Patient taking differently: Take 12.5 mg by mouth daily as needed (leg swelling).) 45 tablet 3  . enalapril (VASOTEC) 10 MG tablet Take 1 tablet (10 mg total) by mouth once for 1 dose. (Patient taking differently: Take 10 mg by mouth daily.) 90 tablet 3  . gabapentin (NEURONTIN) 300 MG capsule Take 300 mg by mouth 3 (three) times daily.    . metFORMIN (GLUCOPHAGE) 500  MG tablet Take 1,000 mg by mouth 2 (two) times daily with a meal.  3  . pravastatin (PRAVACHOL) 40 MG tablet Take 40 mg by mouth daily.  4  . Blood Glucose Calibration (ONETOUCH VERIO) SOLN     . Blood Glucose Monitoring Suppl ([place] FLEX SYSTEM) w/Device KIT     . ONETOUCH VERIO test strip 1 each daily.       Assessment: 87 y.o. female admitted with STEMI for heparin.  Also has atrial fibrillation, not on anticoagulation at admission although reports Eliquis in the past.  Heparin level within goal range today.  No overt bleeding or complications noted.  CBC stable.  Goal of Therapy:  Heparin level 0.3-0.7 units/ml Monitor platelets by anticoagulation protocol: Yes   Plan:  Continue IV heparin at 700 units/hr. Daily heparin level and CBC. F/u plans for chronic anticoagulation for afib?  [place] Crane, Roylene Reason, [facility] Clinical Pharmacist  04/13/2020  8:04 AM   [facility] pharmacy phone numbers are listed on [email or web address]

## 2020-04-13 NOTE — Progress Notes (Signed)
Progress Note  Patient Name: Jamie Mueller Date of Encounter: 04/13/2020  [facility] Cardiologist: Rollene Rotunda, MD   Subjective   Restless yesterday PM, had epigastric pain (angina?) and labs c/w cardiogenic shock. After beta blocker stopped and milrinone added, there are several signs of improvement. She is lying fully supine without respiratory difficulty, is asking for food, denies any pain. Some UO (approx 500 mL). Creat increased further to 2.4. Markedly improved CoOx, from 48.5 to 75%.  Inpatient Medications    Scheduled Meds: . aspirin EC  81 mg Oral Daily  . atorvastatin  40 mg Oral Daily  . Chlorhexidine Gluconate Cloth  6 each Topical Daily  . furosemide  40 mg Intravenous Daily  . insulin aspart  0-9 Units Subcutaneous TID WC  . sodium chloride flush  10-40 mL Intracatheter Q12H  . ticagrelor  90 mg Oral BID   Continuous Infusions: . amiodarone 30 mg/hr (04/13/20 0600)  . heparin 700 Units/hr (04/13/20 0600)  . milrinone 0.25 mcg/kg/min (04/13/20 0600)   PRN Meds: acetaminophen, nitroGLYCERIN, sodium chloride flush   Vital Signs    Vitals:   04/13/20 0400 04/13/20 0500 04/13/20 0600 04/13/20 0700  BP: 100/62 107/66 123/69   Pulse: (!) 130 (!) 130 (!) 130   Resp: (!) 24 (!) 27 (!) 38   Temp:    98 F (36.7 C)  TempSrc:      SpO2: 100% 100% 96%   Weight:  57.4 kg    Height:        Intake/Output Summary (Last 24 hours) at 04/13/2020 4098 Last data filed at 04/13/2020 0600 Gross per 24 hour  Intake 976.33 ml  Output 525 ml  Net 451.33 ml   Last 3 Weights 04/13/2020 03/22/2020 10/26/2018  Weight (lbs) 126 lb 8.7 oz 130 lb (No Data)  Weight (kg) 57.4 kg 58.968 kg (No Data)      Telemetry    AFib and AFlutter w RVR 130s - Personally Reviewed  ECG    AFlutter with variable AV block, QS pattern across entire precordium, persistent ST elevation in anterolateral leads - Personally Reviewed  Physical Exam  Lying flat, smiling GEN: No  acute distress.   Neck: 8 cm JVD Cardiac: irregular, no murmurs, rubs, +ve gallop  Respiratory: Clear to auscultation bilaterally. GI: Soft, nontender, non-distended  MS: No edema; No deformity. Neuro:  Nonfocal  Psych: Normal affect   Labs    High Sensitivity Troponin:   Recent Labs  Lab 04/17/2020 2217 May 12, 2020 0238 05/12/20 0602  TROPONINIHS >135,540* >27,000* >27,000*      Chemistry Recent Labs  Lab 04/06/2020 2134 May 12, 2020 0238 04/13/20 0418  NA 138 141 135  K 4.4 4.5 3.7  CL 104 104 101  CO2 GLUCOSE 239* 210* 291*  BUN 23 24* 31*  CREATININE 1.88* 2.03* 2.43*  CALCIUM 8.6* 8.2* 7.8*  PROT 6.9  --   --   ALBUMIN 3.3*  --   --   AST 467*  --   --   ALT 55*  --   --   ALKPHOS 75  --   --   BILITOT 0.7  --   --   GFRNONAA 26* 23* 19*  ANIONGAP Hematology Recent Labs  Lab 04/10/2020 2134 2020/05/12 0238 04/13/20 0418  WBC 6.5 6.6 9.7  RBC 4.34 4.31 3.70*  HGB 11.6* 11.5* 10.1*  HCT 37.0 36.8 30.5*  MCV 85.3 85.4 82.4  MCH  26.7 26.7 27.3  MCHC 31.4 31.3 33.1  RDW 14.1 14.2 14.1  PLT 277 254 221    BNPNo results for input(s): BNP, PROBNP in the last 168 hours.   DDimer No results for input(s): DDIMER in the last 168 hours.   Radiology    CT ABDOMEN PELVIS WO CONTRAST  Result Date: 03/20/2020 CLINICAL DATA:  Right upper quadrant pain that radiates to back. EXAM: CT ABDOMEN AND PELVIS WITHOUT CONTRAST TECHNIQUE: Multidetector CT imaging of the abdomen and pelvis was performed following the standard protocol without IV contrast. COMPARISON:  None. FINDINGS: Lower chest: Bibasilar atelectasis. Hepatobiliary: Unremarkable noncontrast appearance of the hepatic parenchyma. Gallbladder surgically absent. No biliary ductal dilation. Pancreas: Unremarkable. Spleen: Unremarkable. Adrenals/Urinary Tract: Bilateral adrenal glands are unremarkable. No hydronephrosis. No nephrolithiasis. Urinary bladder is grossly unremarkable for degree of  distension. Stomach/Bowel: Stomach is grossly unremarkable for degree of distension. No small bowel dilation. Appendix is visualized in the right lower quadrant and appears unremarkable. Scattered colonic diverticula without findings acute diverticulitis. Vascular/Lymphatic: Aortic atherosclerosis. No enlarged abdominal or pelvic lymph nodes. Reproductive: Status post hysterectomy. No adnexal masses. Other: No abdominopelvic ascites. Musculoskeletal: Mild levoconvex curvature of the spine multilevel degenerative changes spine. Grade 1 degenerative anterolisthesis of L4 on L5. Degenerative change of the hips, SI joints and pubic symphysis. IMPRESSION: 1. No acute findings in the abdomen or pelvis. 2. Colonic diverticulosis without findings of acute diverticulitis. 3. Aortic atherosclerosis. Aortic Atherosclerosis (ICD10-I70.0). Electronically Signed   By: Maudry Mayhew MD   On: 04/03/2020 23:57   DG CHEST PORT 1 VIEW  Result Date: April 21, 2020 CLINICAL DATA:  Central line placement EXAM: PORTABLE CHEST 1 VIEW COMPARISON:  Portable exam 1216 hours compared to 04/08/2020 FINDINGS: RIGHT arm PICC line with tip projecting over SVC. Enlargement of cardiac silhouette with pulmonary vascular congestion. Mediastinal contours normal. Minimal RIGHT basilar atelectasis. Atelectasis versus consolidation LEFT lower lobe. Minimal LEFT pleural effusion. No RIGHT pleural effusion or pneumothorax. IMPRESSION: Enlargement of cardiac silhouette with pulmonary vascular congestion. Atelectasis versus consolidation LEFT lower lobe with minimal associated LEFT pleural effusion. Electronically Signed   By: Ulyses Southward M.D.   On: 04-21-20 12:26   DG Chest Portable 1 View  Result Date: 04/05/2020 CLINICAL DATA:  Dyspnea. EXAM: PORTABLE CHEST 1 VIEW COMPARISON:  Remote exam 04/29/2003 FINDINGS: Chronic cardiomegaly unchanged. Normal mediastinal contours. No acute or focal airspace disease. No pulmonary edema, pleural effusion, or  pneumothorax. Surgical clips in the left thoracic inlet. Mild degenerative change of the left shoulder. Scoliotic curvature of the lower thoracic spine. IMPRESSION: Chronic cardiomegaly without acute abnormality. Electronically Signed   By: Narda Rutherford M.D.   On: 04/13/2020 22:51   ECHOCARDIOGRAM COMPLETE  Result Date: 04-21-20    ECHOCARDIOGRAM REPORT   Patient Name:   Jamie Mueller Date of Exam: 04-21-2020 Medical Rec #:  [id number]          Height:       62.0 in Accession #:    [id number]         Weight:       130.0 lb Date of Birth:  Jul 15, 1930         BSA:          1.592 m Patient Age:    87 years           BP:           97/73 mmHg Patient Gender: F  HR:           107 bpm. Exam Location:  Inpatient Procedure: 2D Echo Indications:    abnormal ecg  History:        Patient has prior history of Echocardiogram examinations, most                 recent 03/31/2017. Chronic kidney disease, Arrythmias:Atrial                 Fibrillation; Risk Factors:Diabetes, Hypertension and                 Dyslipidemia.  Sonographer:    Delcie Roch Referring Phys: [id number] Ralene Ok IMPRESSIONS  1. Left ventricular ejection fraction, by estimation, is 20 to 25%. The left ventricle has severely decreased function. The left ventricle demonstrates regional wall motion abnormalities (see scoring diagram/findings for description). There is moderate concentric left ventricular hypertrophy. Left ventricular diastolic parameters are consistent with Grade III diastolic dysfunction (restrictive). Elevated left atrial pressure.  2. Right ventricular systolic function is moderately reduced. The right ventricular size is normal. Mildly increased right ventricular wall thickness. There is moderately elevated pulmonary artery systolic pressure.  3. Left atrial size was severely dilated.  4. Right atrial size was mildly dilated.  5. A small pericardial effusion is present. The pericardial effusion is anterior  to the right ventricle.  6. The mitral valve is normal in structure. Mild to moderate mitral valve regurgitation.  7. Tricuspid valve regurgitation is moderate.  8. The aortic valve is tricuspid. There is mild calcification of the aortic valve. There is mild thickening of the aortic valve. Aortic valve regurgitation is not visualized. Mild aortic valve sclerosis is present, with no evidence of aortic valve stenosis. FINDINGS  Left Ventricle: Left ventricular ejection fraction, by estimation, is 20 to 25%. The left ventricle has severely decreased function. The left ventricle demonstrates regional wall motion abnormalities. The left ventricular internal cavity size was normal  in size. There is moderate concentric left ventricular hypertrophy. Left ventricular diastolic parameters are consistent with Grade III diastolic dysfunction (restrictive). Elevated left atrial pressure.  LV Wall Scoring: The mid and distal anterior wall, entire anterior septum, and entire apex are akinetic. The antero-lateral wall, inferior wall, posterior wall, mid inferoseptal segment, basal anterior segment, and basal inferoseptal segment are hypokinetic. Right Ventricle: The right ventricular size is normal. Mildly increased right ventricular wall thickness. Right ventricular systolic function is moderately reduced. There is moderately elevated pulmonary artery systolic pressure. The tricuspid regurgitant velocity is 2.78 m/s, and with an assumed right atrial pressure of 15 mmHg, the estimated right ventricular systolic pressure is 45.9 mmHg. Left Atrium: Left atrial size was severely dilated. Right Atrium: Right atrial size was mildly dilated. Pericardium: A small pericardial effusion is present. The pericardial effusion is anterior to the right ventricle. Mitral Valve: The mitral valve is normal in structure. There is mild thickening of the mitral valve leaflet(s). Mild mitral annular calcification. Mild to moderate mitral valve  regurgitation. Tricuspid Valve: The tricuspid valve is normal in structure. Tricuspid valve regurgitation is moderate. Aortic Valve: The aortic valve is tricuspid. There is mild calcification of the aortic valve. There is mild thickening of the aortic valve. Aortic valve regurgitation is not visualized. Mild aortic valve sclerosis is present, with no evidence of aortic valve stenosis. Pulmonic Valve: The pulmonic valve was grossly normal. Pulmonic valve regurgitation is trivial. Aorta: The aortic root and ascending aorta are structurally normal, with no evidence of dilitation. IAS/Shunts: No  atrial level shunt detected by color flow Doppler.  LEFT VENTRICLE PLAX 2D LVIDd:         3.20 cm LVIDs:         2.80 cm LV PW:         1.60 cm LV IVS:        1.30 cm LVOT diam:     1.80 cm LV SV:         17 LV SV Index:   10 LVOT Area:     2.54 cm  RIGHT VENTRICLE            IVC RV S prime:     6.06 cm/s  IVC diam: 2.30 cm LEFT ATRIUM             Index       RIGHT ATRIUM           Index LA diam:        4.10 cm 2.58 cm/m  RA Area:     13.00 cm LA Vol (A2C):   66.7 ml 41.90 ml/m RA Volume:   28.80 ml  18.09 ml/m LA Vol (A4C):   57.5 ml 36.12 ml/m LA Biplane Vol: 65.8 ml 41.34 ml/m  AORTIC VALVE LVOT Vmax:   41.80 cm/s LVOT Vmean:  24.300 cm/s LVOT VTI:    0.066 m  AORTA Ao Root diam: 2.60 cm Ao Asc diam:  2.90 cm TRICUSPID VALVE TR Peak grad:   30.9 mmHg TR Vmax:        278.00 cm/s  SHUNTS Systemic VTI:  0.07 m Systemic Diam: 1.80 cm Rachelle HoraMihai Krystall Kruckenberg MD Electronically signed by Thurmon FairMihai Chelbie Jarnagin MD Signature Date/Time: 10-18-2020/12:24:51 PM    Final    US EKG SITE RITE  Result Date: 10-18-2020 If Site Rite image not attached, placement could not be confirmed due to current cardiac rhythm.   Cardiac Studies   ECHO 010-01-2020   1. Left ventricular ejection fraction, by estimation, is 20 to 25%. The  left ventricle has severely decreased function. The left ventricle  demonstrates regional wall motion abnormalities  (see scoring  diagram/findings for description). There is moderate  concentric left ventricular hypertrophy. Left ventricular diastolic  parameters are consistent with Grade III diastolic dysfunction  (restrictive). Elevated left atrial pressure.  2. Right ventricular systolic function is moderately reduced. The right  ventricular size is normal. Mildly increased right ventricular wall  thickness. There is moderately elevated pulmonary artery systolic  pressure.  3. Left atrial size was severely dilated.  4. Right atrial size was mildly dilated.  5. A small pericardial effusion is present. The pericardial effusion is  anterior to the right ventricle.  6. The mitral valve is normal in structure. Mild to moderate mitral valve  regurgitation.  7. Tricuspid valve regurgitation is moderate.  8. The aortic valve is tricuspid. There is mild calcification of the  aortic valve. There is mild thickening of the aortic valve. Aortic valve  regurgitation is not visualized. Mild aortic valve sclerosis is present,  with no evidence of aortic valve  stenosis.   Patient Profile     87 y.o. female with DM, HTN, hx of paroxysmal atrial fibrillation and early dementia with delayed presentation of anterolateral STEMI, developed cardiogenic shock and AFib w RVR. Echo findings suggest underlying cardiac amyloidosis.  Assessment & Plan    1. STEMI: echo c/w proximal LAD stenosis related infarction. Delayed presentation. Very high and sustained troponin level. LVEF 20-25%. Persistent ST elevation may predict aneurysm formation and  increased risk for mechanical complications of MI. On ASA, statin, heparin, ticagrelor. Beta blocker stopped due to cardiogenic shock. 2. Cardiogenic shock: improved CoOx after milrinone and stopping beta blocker. Appears to be perfusing better today and has had some UO, albeit oliguric. Warm extremities and feels hungry. No respiratory difficulty while supine. Creatinine  increased. Will hold off diuretics today. 2. AFib w RVR/now in AFlutter: harder to rate control and there may be a component of beta blocker rebound. Increase amio dose. Avoid diltiazem. Consider restarting low dose beta blocker later today if still doing well from CO point of view. Not on chronic anticoagulation. On IV heparin. 3. Acute on chronic renal insuff:  Rapidly rising BUN/creat. Baseline uncertain (creat 1.3 in 2019). Poor PO intake. Outpatient chronic Rx w ACEi and thiazide. Hold metformin/ACEi/thiazides and loop diuretic for now. 4. HTN: low BP overnight. Improved BP, currently in low normal range. 5. DM: well controlled, A1c 6.7%. 6. HypoMg: likely due to thiazide. Recheck.  CRITICAL CARE Performed by: Rachelle Hora Natassia Guthridge   Total critical care time: 50 minutes  Critical care time was exclusive of separately billable procedures and treating other patients.  Critical care was necessary to treat or prevent imminent or life-threatening deterioration.  Critical care was time spent personally by me on the following activities: development of treatment plan with patient and/or surrogate as well as nursing, discussions with consultants, evaluation of patient's response to treatment, examination of patient, obtaining history from patient or surrogate, ordering and performing treatments and interventions, ordering and review of laboratory studies, ordering and review of radiographic studies, pulse oximetry and re-evaluation of patient's condition.   For questions or updates, please contact [facility] Please consult [email or web address] for contact info under        Signed, Thurmon Fair, MD  04/13/2020, 8:22 AM

## 2020-04-14 ENCOUNTER — Inpatient Hospital Stay (HOSPITAL_COMMUNITY): Payer: Medicare Other

## 2020-04-14 DIAGNOSIS — R509 Fever, unspecified: Secondary | ICD-10-CM

## 2020-04-14 DIAGNOSIS — I255 Ischemic cardiomyopathy: Secondary | ICD-10-CM

## 2020-04-14 DIAGNOSIS — I5041 Acute combined systolic (congestive) and diastolic (congestive) heart failure: Secondary | ICD-10-CM | POA: Diagnosis not present

## 2020-04-14 DIAGNOSIS — E1159 Type 2 diabetes mellitus with other circulatory complications: Secondary | ICD-10-CM

## 2020-04-14 DIAGNOSIS — I2109 ST elevation (STEMI) myocardial infarction involving other coronary artery of anterior wall: Secondary | ICD-10-CM | POA: Diagnosis not present

## 2020-04-14 DIAGNOSIS — F0391 Unspecified dementia with behavioral disturbance: Secondary | ICD-10-CM

## 2020-04-14 DIAGNOSIS — N170 Acute kidney failure with tubular necrosis: Secondary | ICD-10-CM

## 2020-04-14 DIAGNOSIS — E876 Hypokalemia: Secondary | ICD-10-CM

## 2020-04-14 DIAGNOSIS — I4891 Unspecified atrial fibrillation: Secondary | ICD-10-CM | POA: Diagnosis not present

## 2020-04-14 DIAGNOSIS — E1169 Type 2 diabetes mellitus with other specified complication: Secondary | ICD-10-CM

## 2020-04-14 DIAGNOSIS — I10 Essential (primary) hypertension: Secondary | ICD-10-CM

## 2020-04-14 DIAGNOSIS — R570 Cardiogenic shock: Secondary | ICD-10-CM | POA: Diagnosis present

## 2020-04-14 DIAGNOSIS — I480 Paroxysmal atrial fibrillation: Secondary | ICD-10-CM

## 2020-04-14 DIAGNOSIS — E785 Hyperlipidemia, unspecified: Secondary | ICD-10-CM

## 2020-04-14 LAB — URINALYSIS, COMPLETE (UACMP) WITH MICROSCOPIC
Bilirubin Urine: NEGATIVE
Glucose, UA: NEGATIVE mg/dL
Hgb urine dipstick: NEGATIVE
Ketones, ur: NEGATIVE mg/dL
Nitrite: NEGATIVE
Protein, ur: 30 mg/dL — AB
Specific Gravity, Urine: 1.024 (ref 1.005–1.030)
pH: 5 (ref 5.0–8.0)

## 2020-04-14 LAB — GLUCOSE, CAPILLARY
Glucose-Capillary: 245 mg/dL — ABNORMAL HIGH (ref 70–99)
Glucose-Capillary: 279 mg/dL — ABNORMAL HIGH (ref 70–99)
Glucose-Capillary: 243 mg/dL — ABNORMAL HIGH (ref 70–99)
Glucose-Capillary: 223 mg/dL — ABNORMAL HIGH (ref 70–99)

## 2020-04-14 LAB — CBC
HCT: 31.1 % — ABNORMAL LOW (ref 36.0–46.0)
Hemoglobin: 10.3 g/dL — ABNORMAL LOW (ref 12.0–15.0)
MCH: 27.2 pg (ref 26.0–34.0)
MCHC: 33.1 g/dL (ref 30.0–36.0)
MCV: 82.3 fL (ref 80.0–100.0)
Platelets: 218 10*3/uL (ref 150–400)
RBC: 3.78 MIL/uL — ABNORMAL LOW (ref 3.87–5.11)
RDW: 14.2 % (ref 11.5–15.5)
WBC: 11.8 10*3/uL — ABNORMAL HIGH (ref 4.0–10.5)
nRBC: 0 % (ref 0.0–0.2)

## 2020-04-14 LAB — BASIC METABOLIC PANEL
Anion gap: 12 (ref 5–15)
BUN: 34 mg/dL — ABNORMAL HIGH (ref 8–23)
CO2: 21 mmol/L — ABNORMAL LOW (ref 22–32)
Calcium: 8.1 mg/dL — ABNORMAL LOW (ref 8.9–10.3)
Chloride: 104 mmol/L (ref 98–111)
Creatinine, Ser: 2.56 mg/dL — ABNORMAL HIGH (ref 0.44–1.00)
GFR, Estimated: 18 mL/min — ABNORMAL LOW (ref >60)
Glucose, Bld: 166 mg/dL — ABNORMAL HIGH (ref 70–99)
Potassium: 3.3 mmol/L — ABNORMAL LOW (ref 3.5–5.1)
Sodium: 137 mmol/L (ref 135–145)

## 2020-04-14 LAB — HEPARIN LEVEL (UNFRACTIONATED)
Heparin Unfractionated: 0.15 IU/mL — ABNORMAL LOW (ref 0.30–0.70)
Heparin Unfractionated: 0.22 IU/mL — ABNORMAL LOW (ref 0.30–0.70)
Heparin Unfractionated: 0.53 IU/mL (ref 0.30–0.70)

## 2020-04-14 LAB — MAGNESIUM: Magnesium: 2.4 mg/dL (ref 1.7–2.4)

## 2020-04-14 MED ORDER — POTASSIUM CHLORIDE CRYS ER 20 MEQ PO TBCR
40.0000 meq | EXTENDED_RELEASE_TABLET | Freq: Once | ORAL | Status: AC
  Administered 2020-04-14: 40 meq via ORAL
  Filled 2020-04-14: qty 2

## 2020-04-14 MED ORDER — POTASSIUM CHLORIDE CRYS ER 10 MEQ PO TBCR
10.0000 meq | EXTENDED_RELEASE_TABLET | Freq: Every day | ORAL | Status: AC
  Administered 2020-04-14 – 2020-04-16 (×2): 10 meq via ORAL
  Filled 2020-04-14 (×3): qty 1

## 2020-04-14 MED ORDER — AMIODARONE LOAD VIA INFUSION
150.0000 mg | Freq: Once | INTRAVENOUS | Status: AC
  Administered 2020-04-14: 150 mg via INTRAVENOUS
  Filled 2020-04-14: qty 83.34

## 2020-04-14 MED ORDER — FUROSEMIDE 10 MG/ML IJ SOLN
40.0000 mg | Freq: Once | INTRAMUSCULAR | Status: AC
  Administered 2020-04-14: 40 mg via INTRAVENOUS
  Filled 2020-04-14: qty 4

## 2020-04-14 MED ORDER — METOPROLOL TARTRATE 12.5 MG HALF TABLET
12.5000 mg | ORAL_TABLET | Freq: Four times a day (QID) | ORAL | Status: DC
  Administered 2020-04-14: 12.5 mg via ORAL
  Filled 2020-04-14: qty 1

## 2020-04-14 MED ORDER — METOPROLOL TARTRATE 12.5 MG HALF TABLET
12.5000 mg | ORAL_TABLET | Freq: Two times a day (BID) | ORAL | Status: DC
  Administered 2020-04-14: 12.5 mg via ORAL
  Filled 2020-04-14: qty 1

## 2020-04-14 NOTE — Progress Notes (Signed)
ANTICOAGULATION CONSULT NOTE  Pharmacy Consult for Heparin Indication: chest pain/ACS; also w/ afib  No Known Allergies  Patient Measurements: Height: 5' 2"  (157.5 cm) Weight: 57.5 kg (126 lb 12.2 oz) IBW/kg (Calculated) : 50.1     Vital Signs: Temp: 98.1 F (36.7 C) (03/28 0822) Temp Source: Oral (03/28 0822) BP: 103/90 (03/28 1054) Pulse Rate: 138 (03/28 0830)  Labs: Recent Labs    04/17/2020 2134 03/25/2020 2217 03/25/2020 0238 04/17/2020 0602 03/24/2020 0818 04/13/20 0418 04/14/20 0051 04/14/20 1003  HGB 11.6*  --  11.5*  --   --  10.1* 10.3*  --   HCT 37.0  --  36.8  --   --  30.5* 31.1*  --   PLT 277  --  254  --   --  221 218  --   APTT 29  --   --   --   --   --   --   --   LABPROT 12.6  --   --   --   --   --   --   --   INR 1.0  --   --   --   --   --   --   --   HEPARINUNFRC  --   --   --   --    < > 0.32 0.15* 0.22*  CREATININE 1.88*  --  2.03*  --   --  2.43* 2.56*  --   TROPONINIHS  --  >135,540* >27,000* >27,000*  --   --   --   --    < > = values in this interval not displayed.    Estimated Creatinine Clearance: 12.2 mL/min (A) (by C-G formula based on SCr of 2.56 mg/dL (H)).   Medical History: Past Medical History:  Diagnosis Date  . Diabetes mellitus without complication ([place])   . Hypercholesteremia   . Hypertension    benign  . Memory loss   . PAF (paroxysmal atrial fibrillation) ([place])   . Vitamin B 12 deficiency     Medications:  No current facility-administered medications on file prior to encounter.   Current Outpatient Medications on File Prior to Encounter  Medication Sig Dispense Refill  . atenolol (TENORMIN) 50 MG tablet Take 50 mg by mouth daily.  4  . chlorthalidone (HYGROTON) 25 MG tablet Take 1/2 tablet daily. (Patient taking differently: Take 12.5 mg by mouth daily as needed (leg swelling).) 45 tablet 3  . enalapril (VASOTEC) 10 MG tablet Take 1 tablet (10 mg total) by mouth once for 1 dose. (Patient taking differently: Take 10 mg  by mouth daily.) 90 tablet 3  . gabapentin (NEURONTIN) 300 MG capsule Take 300 mg by mouth 3 (three) times daily.    . metFORMIN (GLUCOPHAGE) 500 MG tablet Take 1,000 mg by mouth 2 (two) times daily with a meal.  3  . pravastatin (PRAVACHOL) 40 MG tablet Take 40 mg by mouth daily.  4  . Blood Glucose Calibration (ONETOUCH VERIO) SOLN     . Blood Glucose Monitoring Suppl ([place] FLEX SYSTEM) w/Device KIT     . ONETOUCH VERIO test strip 1 each daily.       Assessment: 87 y.o. female admitted with STEMI for heparin.  Also has atrial fibrillation, not on anticoagulation at admission although reports Eliquis in the past.  Heparin level remains subtherapeutic at 0.22 after rate increased from 700 to 850 units/hr. CBC stable. No s/sx bleeding reported.  Per Cardiology, no plan  for cath, continue heparin likely for 72 hours total, pending possible need for TEE/DCCV.  Goal of Therapy:  Heparin level 0.3-0.7 units/ml Monitor platelets by anticoagulation protocol: Yes   Plan:  Increase IV heparin to 1000 units/hr Heparin level in 8 hours Daily heparin level and CBC F/u plans for long term anticoagulation for afib  Fara Olden, PharmD PGY-1 Pharmacy Resident 04/14/2020 12:39 PM Please see AMION for all pharmacy numbers

## 2020-04-14 NOTE — Progress Notes (Signed)
ANTICOAGULATION CONSULT NOTE Pharmacy Consult for Heparin Indication: chest pain/ACS and afib  No Known Allergies  Patient Measurements: Height: 5\' 2"  (157.5 cm) Weight: 57.4 kg (126 lb 8.7 oz) IBW/kg (Calculated) : 50.1    Vital Signs: Temp: 98.6 F (37 C) (03/27 2300) Temp Source: Oral (03/27 2300) BP: 119/76 (03/28 0000) Pulse Rate: 139 (03/28 0000)  Labs: Recent Labs    2020-04-29 2134 April 29, 2020 2217 03/22/2020 0238 04/06/2020 0602 03/29/2020 0818 03/23/2020 1837 04/13/20 0418 04/14/20 0051  HGB 11.6*  --  11.5*  --   --   --  10.1* 10.3*  HCT 37.0  --  36.8  --   --   --  30.5* 31.1*  PLT 277  --  254  --   --   --  221 218  APTT 29  --   --   --   --   --   --   --   LABPROT 12.6  --   --   --   --   --   --   --   INR 1.0  --   --   --   --   --   --   --   HEPARINUNFRC  --   --   --   --    < > 0.40 0.32 0.15*  CREATININE 1.88*  --  2.03*  --   --   --  2.43* 2.56*  TROPONINIHS  --  >135,540* >27,000* >27,000*  --   --   --   --    < > = values in this interval not displayed.    Estimated Creatinine Clearance: 12.2 mL/min (A) (by C-G formula based on SCr of 2.56 mg/dL (H)).   Assessment: 87 y.o. female admitted with STEMI, now in Aflutter, for heparin  Goal of Therapy:  Heparin level 0.3-0.7 units/ml Monitor platelets by anticoagulation protocol: Yes   Plan:  Increase Heparin  850 units/hr Check heparin level in 8 hours.  97, PharmD, BCPS

## 2020-04-14 NOTE — Progress Notes (Signed)
Inpatient Diabetes Program Recommendations  AACE/ADA: New Consensus Statement on Inpatient Glycemic Control   Target Ranges:  Prepandial:   less than 140 mg/dL      Peak postprandial:   less than 180 mg/dL (1-2 hours)      Critically ill patients:  140 - 180 mg/dL   Results for TEHILA, SOKOLOW (MRN [id number]) as of 04/14/2020 12:14  Ref. Range 04/13/2020 07:00 04/13/2020 11:25 04/13/2020 16:04 04/13/2020 22:04 04/14/2020 08:12 04/14/2020 11:38  Glucose-Capillary Latest Ref Range: 70 - 99 mg/dL 115 (H) 726 (H) 203 (H) 138 (H) 245 (H) 279 (H)   Review of Glycemic Control  Diabetes history: DM2 Outpatient Diabetes medications: Metformin 1000 mg BID Current orders for Inpatient glycemic control: Novolog 0-9 units TID with meals  Inpatient Diabetes Program Recommendations:    Insulin: Please consider ordering Lantus 5 units Q24H.  Thanks, Orlando Penner, RN, MSN, CDE Diabetes Coordinator Inpatient Diabetes Program [phone] (Team Pager from 8am to 5pm)

## 2020-04-14 NOTE — Progress Notes (Signed)
ANTICOAGULATION CONSULT NOTE  Pharmacy Consult for Heparin Indication: chest pain/ACS; also w/ afib  No Known Allergies  Patient Measurements: Height: _0  (157.5 cm) Weight: 57.5 kg (126 lb 12.2 oz) IBW/kg (Calculated) : 50.1     Vital Signs: BP: 106/79 (03/28 1600) Pulse Rate: 113 (03/28 2000)  Labs: Recent Labs     0000 04/02/2020 2217 04/02/2020 0238 04/09/2020 0602 04/06/2020 0818 04/13/20 0418 04/14/20 0051 04/14/20 1003 04/14/20 2025  HGB   < >  --  11.5*  --   --  10.1* 10.3*  --   --   HCT  --   --  36.8  --   --  30.5* 31.1*  --   --   PLT  --   --  254  --   --  221 218  --   --   HEPARINUNFRC  --   --   --   --    < > 0.32 0.15* 0.22* 0.53  CREATININE  --   --  2.03*  --   --  2.43* 2.56*  --   --   TROPONINIHS  --  >135,540* >27,000* >27,000*  --   --   --   --   --    < > = values in this interval not displayed.    Estimated Creatinine Clearance: 12.2 mL/min (A) (by C-G formula based on SCr of 2.56 mg/dL (H)).   Medical History: Past Medical History:  Diagnosis Date  . Diabetes mellitus without complication ([place])   . Hypercholesteremia   . Hypertension    benign  . Memory loss   . PAF (paroxysmal atrial fibrillation) ([place])   . Vitamin B 12 deficiency     Medications:  No current facility-administered medications on file prior to encounter.   Current Outpatient Medications on File Prior to Encounter  Medication Sig Dispense Refill  . atenolol (TENORMIN) 50 MG tablet Take 50 mg by mouth daily.  4  . chlorthalidone (HYGROTON) 25 MG tablet Take 1/2 tablet daily. (Patient taking differently: Take 12.5 mg by mouth daily as needed (leg swelling).) 45 tablet 3  . enalapril (VASOTEC) 10 MG tablet Take 1 tablet (10 mg total) by mouth once for 1 dose. (Patient taking differently: Take 10 mg by mouth daily.) 90 tablet 3  . gabapentin (NEURONTIN) 300 MG capsule Take 300 mg by mouth 3 (three) times daily.    . metFORMIN (GLUCOPHAGE) 500 MG tablet Take 1,000 mg  by mouth 2 (two) times daily with a meal.  3  . pravastatin (PRAVACHOL) 40 MG tablet Take 40 mg by mouth daily.  4  . Blood Glucose Calibration (ONETOUCH VERIO) SOLN     . Blood Glucose Monitoring Suppl ([place] FLEX SYSTEM) w/Device KIT     . ONETOUCH VERIO test strip 1 each daily.       Assessment: 87 y.o. female admitted with STEMI for heparin.  Also has atrial fibrillation, not on anticoagulation at admission although reports Eliquis in the past.  Heparin level is therapeutic at 0.53 after rate was increased to 1000 units/hr. CBC stable. No s/sx bleeding reported.  Per Cardiology, no plan for cath, continue heparin likely for 72 hours total, pending possible need for TEE/DCCV.  Goal of Therapy:  Heparin level 0.3-0.7 units/ml Monitor platelets by anticoagulation protocol: Yes   Plan:  Continue IV heparin to 1000 units/hr Daily heparin level and CBC F/u plans for long term anticoagulation for afib  Sloan Leiter, PharmD, BCPS, BCCCP  Clinical Pharmacist Please refer to [facility] for [place] numbers 04/14/2020 9:51 PM

## 2020-04-14 NOTE — Progress Notes (Addendum)
Progress Note  Patient Name: Jamie Mueller Date of Encounter: 04/14/2020  [facility] Cardiologist: Rollene Rotunda, MD   Subjective   Remains restless.  Somewhat tachypneic this morning.  No complaints of chest pain.  Apparently she pulled her PICC line out last night.  No longer able to check CoOx.  She is tachypneic, but does not seem to be in significant respiratory distress, just breathing harder. Labs this morning show increase in creatinine up to 2.56 and reduce potassium. Clocks not yet obtained today.  Was 75 yesterday on milrinone. Urine output not adequately recorded .  There is been at least 2-3 urine occurrences but the amount was not recorded-recorded output yesterday was 525 mL-only 80 mL recorded overnight..   Poor p.o. intake. T-max 100.5  Inpatient Medications    Scheduled Meds: . amiodarone  150 mg Intravenous Once  . aspirin EC  81 mg Oral Daily  . atorvastatin  40 mg Oral Daily  . Chlorhexidine Gluconate Cloth  6 each Topical Daily  . furosemide  40 mg Intravenous Once  . insulin aspart  0-9 Units Subcutaneous TID WC  . metoprolol tartrate  12.5 mg Oral Q6H  . potassium chloride  10 mEq Oral Daily  . potassium chloride  40 mEq Oral Once  . sodium chloride flush  10-40 mL Intracatheter Q12H  . ticagrelor  90 mg Oral BID   Continuous Infusions: . amiodarone 30 mg/hr (04/14/20 0600)  . heparin 850 Units/hr (04/14/20 0600)  . milrinone 0.25 mcg/kg/min (04/14/20 0600)   PRN Meds: acetaminophen, nitroGLYCERIN, sodium chloride flush   Vital Signs    Vitals:   04/14/20 0317 04/14/20 0400 04/14/20 0500 04/14/20 0600  BP:  100/70  123/73  Pulse:  (!) 137 (!) 143 (!) 145  Resp:  (!) 33 (!) 39 (!) 30  Temp: 97.9 F (36.6 C)     TempSrc: Oral     SpO2:  98% 96% 99%  Weight:   57.5 kg   Height:        Intake/Output Summary (Last 24 hours) at 04/14/2020 0813 Last data filed at 04/14/2020 0600 Gross per 24 hour  Intake 811.08 ml  Output 80 ml   Net 731.08 ml   Last 3 Weights 04/14/2020 04/13/2020 April 29, 2020  Weight (lbs) 126 lb 12.2 oz 126 lb 8.7 oz 130 lb  Weight (kg) 57.5 kg 57.4 kg 58.968 kg      Telemetry    Atrial fibrillation flutter rates in the 140s overnight.- Personally Reviewed  ECG    No new EKG this morning- Personally Reviewed  Physical Exam   General appearance: alert, cooperative, appears stated age, slowed mentation and Confused, somewhat tachypneic, very agitated Neck: no carotid bruit and JVD roughly 8 cmH2O-however hard to examine due to constant movement Lungs: clear to auscultation bilaterally, normal percussion bilaterally and Nonlabored, good air movement.  No wheezes rales or rhonchi. Heart: irregularly irregular rhythm and Tachycardic without any murmurs or rubs.  With A. fib, difficult assess gallop Abdomen: soft, non-tender; bowel sounds normal; no masses,  no organomegaly Extremities: extremities normal, atraumatic, no cyanosis or edema Pulses: 2+ and symmetric Neurologic: Mental status: She is fully awake and alert, very confused.  Not sure of her location, and has no concept of why she is here.  Wants to go home.  Labs    High Sensitivity Troponin:   Recent Labs  Lab 04/29/20 2217 03/31/2020 0238 04/01/2020 0602  TROPONINIHS >135,540* >27,000* >27,000*      Chemistry  Recent Labs  Lab 03/26/2020 2134 04-26-20 0238 04/13/20 0418 04/14/20 0051  NA 138 141 135 137  K 4.4 4.5 3.7 3.3*  CL 104 104 101 104  CO2 21*  GLUCOSE 239* 210* 291* 166*  BUN 23 24* 31* 34*  CREATININE 1.88* 2.03* 2.43* 2.56*  CALCIUM 8.6* 8.2* 7.8* 8.1*  PROT 6.9  --   --   --   ALBUMIN 3.3*  --   --   --   AST 467*  --   --   --   ALT 55*  --   --   --   ALKPHOS 75  --   --   --   BILITOT 0.7  --   --   --   GFRNONAA 26* 23* 19* 18*  ANIONGAP Hematology Recent Labs  Lab 2020-04-26 0238 04/13/20 0418 04/14/20 0051  WBC 6.6 9.7 11.8*  RBC 4.31 3.70* 3.78*  HGB 11.5* 10.1*  10.3*  HCT 36.8 30.5* 31.1*  MCV 85.4 82.4 82.3  MCH 26.7 27.3 27.2  MCHC 31.3 33.1 33.1  RDW 14.2 14.1 14.2  PLT 254 221 218    BNPNo results for input(s): BNP, PROBNP in the last 168 hours.   DDimer No results for input(s): DDIMER in the last 168 hours.   Radiology    DG CHEST PORT 1 VIEW  Result Date: 26-Apr-2020 CLINICAL DATA:  Central line placement EXAM: PORTABLE CHEST 1 VIEW COMPARISON:  Portable exam 1216 hours compared to 04/16/2020 FINDINGS: RIGHT arm PICC line with tip projecting over SVC. Enlargement of cardiac silhouette with pulmonary vascular congestion. Mediastinal contours normal. Minimal RIGHT basilar atelectasis. Atelectasis versus consolidation LEFT lower lobe. Minimal LEFT pleural effusion. No RIGHT pleural effusion or pneumothorax. IMPRESSION: Enlargement of cardiac silhouette with pulmonary vascular congestion. Atelectasis versus consolidation LEFT lower lobe with minimal associated LEFT pleural effusion. Electronically Signed   By: Ulyses Southward M.D.   On: 26-Apr-2020 12:26   ECHOCARDIOGRAM COMPLETE  Result Date: 04-26-2020    ECHOCARDIOGRAM REPORT   Patient Name:   Jamie Mueller Date of Exam: 2020/04/26 Medical Rec #:  [id number]          Height:       62.0 in Accession #:    [id number]         Weight:       130.0 lb Date of Birth:  1930/03/29         BSA:          1.592 m Patient Age:    87 years           BP:           97/73 mmHg Patient Gender: F                  HR:           107 bpm. Exam Location:  Inpatient Procedure: 2D Echo Indications:    abnormal ecg  History:        Patient has prior history of Echocardiogram examinations, most                 recent 03/31/2017. Chronic kidney disease, Arrythmias:Atrial                 Fibrillation; Risk Factors:Diabetes, Hypertension and                 Dyslipidemia.  Sonographer:    Delcie Roch  Referring Phys: [id number] Ralene Ok IMPRESSIONS  1. Left ventricular ejection fraction, by estimation, is 20 to 25%. The  left ventricle has severely decreased function. The left ventricle demonstrates regional wall motion abnormalities (see scoring diagram/findings for description). There is moderate concentric left ventricular hypertrophy. Left ventricular diastolic parameters are consistent with Grade III diastolic dysfunction (restrictive). Elevated left atrial pressure.  2. Right ventricular systolic function is moderately reduced. The right ventricular size is normal. Mildly increased right ventricular wall thickness. There is moderately elevated pulmonary artery systolic pressure.  3. Left atrial size was severely dilated.  4. Right atrial size was mildly dilated.  5. A small pericardial effusion is present. The pericardial effusion is anterior to the right ventricle.  6. The mitral valve is normal in structure. Mild to moderate mitral valve regurgitation.  7. Tricuspid valve regurgitation is moderate.  8. The aortic valve is tricuspid. There is mild calcification of the aortic valve. There is mild thickening of the aortic valve. Aortic valve regurgitation is not visualized. Mild aortic valve sclerosis is present, with no evidence of aortic valve stenosis. FINDINGS  Left Ventricle: Left ventricular ejection fraction, by estimation, is 20 to 25%. The left ventricle has severely decreased function. The left ventricle demonstrates regional wall motion abnormalities. The left ventricular internal cavity size was normal  in size. There is moderate concentric left ventricular hypertrophy. Left ventricular diastolic parameters are consistent with Grade III diastolic dysfunction (restrictive). Elevated left atrial pressure.  LV Wall Scoring: The mid and distal anterior wall, entire anterior septum, and entire apex are akinetic. The antero-lateral wall, inferior wall, posterior wall, mid inferoseptal segment, basal anterior segment, and basal inferoseptal segment are hypokinetic. Right Ventricle: The right ventricular size is normal.  Mildly increased right ventricular wall thickness. Right ventricular systolic function is moderately reduced. There is moderately elevated pulmonary artery systolic pressure. The tricuspid regurgitant velocity is 2.78 m/s, and with an assumed right atrial pressure of 15 mmHg, the estimated right ventricular systolic pressure is 45.9 mmHg. Left Atrium: Left atrial size was severely dilated. Right Atrium: Right atrial size was mildly dilated. Pericardium: A small pericardial effusion is present. The pericardial effusion is anterior to the right ventricle. Mitral Valve: The mitral valve is normal in structure. There is mild thickening of the mitral valve leaflet(s). Mild mitral annular calcification. Mild to moderate mitral valve regurgitation. Tricuspid Valve: The tricuspid valve is normal in structure. Tricuspid valve regurgitation is moderate. Aortic Valve: The aortic valve is tricuspid. There is mild calcification of the aortic valve. There is mild thickening of the aortic valve. Aortic valve regurgitation is not visualized. Mild aortic valve sclerosis is present, with no evidence of aortic valve stenosis. Pulmonic Valve: The pulmonic valve was grossly normal. Pulmonic valve regurgitation is trivial. Aorta: The aortic root and ascending aorta are structurally normal, with no evidence of dilitation. IAS/Shunts: No atrial level shunt detected by color flow Doppler.  LEFT VENTRICLE PLAX 2D LVIDd:         3.20 cm LVIDs:         2.80 cm LV PW:         1.60 cm LV IVS:        1.30 cm LVOT diam:     1.80 cm LV SV:         17 LV SV Index:   10 LVOT Area:     2.54 cm  RIGHT VENTRICLE            IVC RV S prime:  6.06 cm/s  IVC diam: 2.30 cm LEFT ATRIUM             Index       RIGHT ATRIUM           Index LA diam:        4.10 cm 2.58 cm/m  RA Area:     13.00 cm LA Vol (A2C):   66.7 ml 41.90 ml/m RA Volume:   28.80 ml  18.09 ml/m LA Vol (A4C):   57.5 ml 36.12 ml/m LA Biplane Vol: 65.8 ml 41.34 ml/m  AORTIC VALVE LVOT  Vmax:   41.80 cm/s LVOT Vmean:  24.300 cm/s LVOT VTI:    0.066 m  AORTA Ao Root diam: 2.60 cm Ao Asc diam:  2.90 cm TRICUSPID VALVE TR Peak grad:   30.9 mmHg TR Vmax:        278.00 cm/s  SHUNTS Systemic VTI:  0.07 m Systemic Diam: 1.80 cm Rachelle Hora Croitoru MD Electronically signed by Thurmon Fair MD Signature Date/Time: 04-18-2020/12:24:51 PM    Final    [place] EKG SITE RITE  Result Date: 04/18/2020 If Site Rite image not attached, placement could not be confirmed due to current cardiac rhythm.   Cardiac Studies    ECHO April 18, 2020: EF 20 to 25%-mid and distal anterior wall, anterior septum and apex akinetic, anterolateral, inferior wall, posterior wall and mid inferoseptal segment basal anterior segment and basal inferoseptal segment hypokinetic.  (Suggestive of large LAD ischemia/infarction).  Moderate concentric LVH.  GR 3 DD (restrictive)with elevated LAP -> severely dilated left atrium.;.  Moderate reduced RV function.  Mildly increased thickness.  Moderately elevated PAP.  Mildly dilated right atrium.  Small pericardial effusion.  Mild to moderate MR.  Moderate TR.  Mild aortic valve calcification-sclerosis but no stenosis.  Patient Profile     87 y.o. female with PMH notable for PAF, DM-2, HTN as well as early stages of dementia admitted with delayed presentation of anterolateral STEMI in the setting of A. fib RVR.  She subsequently developed~cardiogenic shock with AKI, requiring inotrope support.  Per Dr. Royann Shivers, echocardiogram suggests potential cardiac amyloid..  Assessment & Plan    Principal Problem:   Acute ST elevation myocardial infarction (STEMI) of anterolateral wall (HCC) Active Problems:   PAF (paroxysmal atrial fibrillation) (HCC)   Atrial fibrillation with RVR (HCC)   Acute renal failure superimposed on stage 3a chronic kidney disease (HCC)   Cardiogenic shock (HCC)   Ischemic cardiomyopathy   Acute combined systolic and diastolic heart failure (HCC)   Type 2 diabetes  mellitus with other circulatory complications (HCC)   Essential hypertension   Hyperlipidemia associated with type 2 diabetes mellitus (HCC)   Hypomagnesemia   1.  Acute anterolateral STEMI: Echo findings consistent with LAD stenosis related STEMI.  Delayed presentation => Per discussion with on-call interventionalists, plan medical management. ischemic cardiomyopathy/quite clearly has coronary disease-no heart catheterization done because of completed infarct and no active symptoms.  Suspect proximal LAD occlusion.  LVEF 20 to 25%.  Concerning persistent ST elevation suggesting possible apical aneurysm.  At risk for mechanical and arrhythmia genic complications of MI.  For now medical management with IV heparin x72 hours, aspirin and Brilinta..  With plan to likely consider cardioversion and short-term DOAC, will switch to Plavix at that time.  With her ongoing tachycardia, we will start low-dose beta-blocker 12.5 mg every 6 hours for additional rate control-while on milrinone..  40 mg atorvastatin  2. Cardiogenic shock/ischemic cardiomyopathy/acute combined systolic and diastolic heart  failure: Initial CoOx was 45%. -Improved yesterday after initiation of milrinone and discontinuation of beta-blocker.  Noted better perfusion with some urine output, albeit oliguric.  Extremities warm to touch.  Thankfully, no respiratory difficulties at this time lying flat, suggesting no overt CHF despite reduced EF.   Renal function initially improved yesterday, but  worse today.    Diuretics held yesterday.  -> will dose gently with IV Lasix 40 mg today and monitor output.  We will plan to wean down milrinone 0.125 this evening if pressures tolerate.  Hoping to avoid exacerbating tachycardia   2. AFib w RVR/now in AFlutter: Difficult rate control;  Now on IV amiodarone (once better rate control established, would like to convert to p.o. to avoid increased IV infusion) => we will rebolused with 150  mg and increased IV perfusion dose.  restart low-dose beta-blocker today to assist with rate control; also wean down milrinone this evening if blood pressure tolerate.  On IV heparin for MI, [facility]  Can discuss prior to discharge.  will consider switching to DOAC (and then to Plavix from Brilinta) from IV heparin after 72 hours IV heparin as treatment for her MI. =>    If unable to adequately control, may need to consider cardioversion (however would like to try to avoid TEE) -  3. Acute on chronic renal insuff:  Rapidly rising BUN/creat. Baseline uncertain (creat 1.3 in 2019).  Likely related to cardiogenic shock and acute combined heart failure.  Also complicated by poor p.o. intake.   Had previously been on ACE inhibitor and thiazide, will hold for now; and avoid loop diuretic at this time..   Hold Metformin   4. HTN:  Remains relatively low.  However, improved with milrinone.    Restart beta-blocker once more stabilized.  5. DM-2: well controlled, A1c 6.7%. ->  Sliding scale insulin.  6. HypoMg (hypokalemia): likely due to thiazide.    Magnesium was repleted, recheck -2.4 yesterday..  Potassium 3.3 today, on supplementation, need to increase potassium supplement  7. Low-grade fever of 100.5 with a slightly increased WBC. ->  Will send UA and check chest x-ray.  8.  Dementia/delirium => she has soft restraints with mittens, will also ask for safety sitter.  Mostly will try to reorient, and avoid medications.   CRITICAL CARE Performed by: Bryan Lemmaavid Harding   Total critical care time: 50 minutes  Critical care time was exclusive of separately billable procedures and treating other patients.  Critical care was necessary to treat or prevent imminent or life-threatening deterioration.  Critical care was time spent personally by me on the following activities: development of treatment plan with patient and/or surrogate as well as nursing,  discussions with consultants and pharmacist team, evaluation of patient's response to treatment, examination of patient, obtaining history from patient or surrogate, ordering and performing treatments and interventions, ordering and review of laboratory studies, ordering and review of radiographic studies, pulse oximetry and re-evaluation of patient's condition.   For questions or updates, please contact [facility] Please consult [email or web address] for contact info under        Signed, Bryan Lemmaavid Harding, MD  04/14/2020, 8:13 AM

## 2020-04-15 ENCOUNTER — Inpatient Hospital Stay: Payer: Self-pay

## 2020-04-15 ENCOUNTER — Inpatient Hospital Stay (HOSPITAL_COMMUNITY): Payer: Medicare Other

## 2020-04-15 DIAGNOSIS — N170 Acute kidney failure with tubular necrosis: Secondary | ICD-10-CM | POA: Diagnosis not present

## 2020-04-15 DIAGNOSIS — I5041 Acute combined systolic (congestive) and diastolic (congestive) heart failure: Secondary | ICD-10-CM | POA: Diagnosis not present

## 2020-04-15 DIAGNOSIS — I4891 Unspecified atrial fibrillation: Secondary | ICD-10-CM | POA: Diagnosis not present

## 2020-04-15 DIAGNOSIS — I2109 ST elevation (STEMI) myocardial infarction involving other coronary artery of anterior wall: Secondary | ICD-10-CM | POA: Diagnosis not present

## 2020-04-15 LAB — CBC
HCT: 29.5 % — ABNORMAL LOW (ref 36.0–46.0)
Hemoglobin: 9.6 g/dL — ABNORMAL LOW (ref 12.0–15.0)
MCH: 27.1 pg (ref 26.0–34.0)
MCHC: 32.5 g/dL (ref 30.0–36.0)
MCV: 83.3 fL (ref 80.0–100.0)
Platelets: 229 10*3/uL (ref 150–400)
RBC: 3.54 MIL/uL — ABNORMAL LOW (ref 3.87–5.11)
RDW: 14.6 % (ref 11.5–15.5)
WBC: 13.3 10*3/uL — ABNORMAL HIGH (ref 4.0–10.5)
nRBC: 0 % (ref 0.0–0.2)

## 2020-04-15 LAB — HEPARIN LEVEL (UNFRACTIONATED)
Heparin Unfractionated: 0.71 IU/mL — ABNORMAL HIGH (ref 0.30–0.70)
Heparin Unfractionated: 0.83 IU/mL — ABNORMAL HIGH (ref 0.30–0.70)

## 2020-04-15 LAB — COMPREHENSIVE METABOLIC PANEL
ALT: 109 U/L — ABNORMAL HIGH (ref 0–44)
AST: 255 U/L — ABNORMAL HIGH (ref 15–41)
Albumin: 2.6 g/dL — ABNORMAL LOW (ref 3.5–5.0)
Alkaline Phosphatase: 99 U/L (ref 38–126)
Anion gap: 19 — ABNORMAL HIGH (ref 5–15)
BUN: 49 mg/dL — ABNORMAL HIGH (ref 8–23)
CO2: 17 mmol/L — ABNORMAL LOW (ref 22–32)
Calcium: 8.3 mg/dL — ABNORMAL LOW (ref 8.9–10.3)
Chloride: 101 mmol/L (ref 98–111)
Creatinine, Ser: 3.84 mg/dL — ABNORMAL HIGH (ref 0.44–1.00)
GFR, Estimated: 11 mL/min — ABNORMAL LOW (ref >60)
Glucose, Bld: 159 mg/dL — ABNORMAL HIGH (ref 70–99)
Potassium: 4.3 mmol/L (ref 3.5–5.1)
Sodium: 137 mmol/L (ref 135–145)
Total Bilirubin: 1.6 mg/dL — ABNORMAL HIGH (ref 0.3–1.2)
Total Protein: 5.8 g/dL — ABNORMAL LOW (ref 6.5–8.1)

## 2020-04-15 LAB — BLOOD GAS, VENOUS
Acid-base deficit: 6.3 mmol/L — ABNORMAL HIGH (ref 0.0–2.0)
Bicarbonate: 18.1 mmol/L — ABNORMAL LOW (ref 20.0–28.0)
Drawn by: 5896
FIO2: 21
O2 Saturation: 47.4 %
Patient temperature: 36.5
pCO2, Ven: 32.4 mmHg — ABNORMAL LOW (ref 44.0–60.0)
pH, Ven: 7.363 (ref 7.250–7.430)
pO2, Ven: <31 mmHg — CL (ref 32.0–45.0)

## 2020-04-15 LAB — GLUCOSE, CAPILLARY
Glucose-Capillary: 110 mg/dL — ABNORMAL HIGH (ref 70–99)
Glucose-Capillary: 132 mg/dL — ABNORMAL HIGH (ref 70–99)
Glucose-Capillary: 175 mg/dL — ABNORMAL HIGH (ref 70–99)
Glucose-Capillary: 192 mg/dL — ABNORMAL HIGH (ref 70–99)

## 2020-04-15 LAB — MAGNESIUM: Magnesium: 2.4 mg/dL (ref 1.7–2.4)

## 2020-04-15 MED ORDER — SODIUM CHLORIDE 0.9 % IV SOLN: INTRAVENOUS | Status: DC

## 2020-04-15 MED ORDER — SODIUM CHLORIDE 0.9 % IV SOLN
250.0000 mL | INTRAVENOUS | Status: DC
  Administered 2020-04-15: 250 mL via INTRAVENOUS

## 2020-04-15 MED ORDER — DEXMEDETOMIDINE HCL IN NACL 400 MCG/100ML IV SOLN
0.1000 ug/kg/h | INTRAVENOUS | Status: DC
  Administered 2020-04-15: 0.2 ug/kg/h via INTRAVENOUS
  Administered 2020-04-16: 0.6 ug/kg/h via INTRAVENOUS
  Filled 2020-04-15 (×2): qty 100

## 2020-04-15 MED ORDER — NOREPINEPHRINE 4 MG/250ML-% IV SOLN: 0.0000 ug/min | INTRAVENOUS | Status: DC

## 2020-04-15 MED ORDER — RESOURCE THICKENUP CLEAR PO POWD
ORAL | Status: DC | PRN
  Filled 2020-04-15: qty 125

## 2020-04-15 MED ORDER — PANTOPRAZOLE SODIUM 40 MG IV SOLR
40.0000 mg | INTRAVENOUS | Status: DC
  Administered 2020-04-15 – 2020-04-16 (×2): 40 mg via INTRAVENOUS
  Filled 2020-04-15 (×2): qty 40

## 2020-04-15 MED ORDER — ACETAMINOPHEN 160 MG/5ML PO SOLN
650.0000 mg | ORAL | Status: DC | PRN
  Filled 2020-04-15: qty 20.3

## 2020-04-15 MED ORDER — NOREPINEPHRINE 4 MG/250ML-% IV SOLN
2.0000 ug/min | INTRAVENOUS | Status: DC
  Administered 2020-04-15: 8 ug/min via INTRAVENOUS
  Administered 2020-04-15 (×2): 2 ug/min via INTRAVENOUS
  Administered 2020-04-16: 9 ug/min via INTRAVENOUS
  Administered 2020-04-16: 6 ug/min via INTRAVENOUS
  Filled 2020-04-15 (×4): qty 250

## 2020-04-15 MED ORDER — AMIODARONE IV BOLUS ONLY 150 MG/100ML
150.0000 mg | Freq: Once | INTRAVENOUS | Status: AC
  Administered 2020-04-15: 150 mg via INTRAVENOUS
  Filled 2020-04-15: qty 100

## 2020-04-15 NOTE — Consult Note (Signed)
[facility]  INPATIENT CONSULTATION  Reason for Consultation: AKI Requesting Provider: Dr. Ellyn Hack  HPI: Jamie Mueller is an 87 y.o. female with HTN, DM, HL, A fib, dementia who is currently admitted for STEMI and nephrology is consulted for evaluation and management of AKI.   Presented 3/25 with abdominal pain found to have A fib with RVR, STEMI with HST > 130k, Cr 1.9.  Cardiology has elected medical management.   Course has been complicated by hypotension and A fib with RVR, cardiogenic shock.   She's now on milrinone and NE.   Cr initially 1.9 > 2 > 2.4 > 2.6 and today 3.8; I/O cath ~48m.   She's had confusion in the hospital, to the point of pulling out PICC. Mental status has improved with addition of vasopressor support.    Son and daughter are bedside.   PMH: Past Medical History:  Diagnosis Date  . Diabetes mellitus without complication ([facility]   . Hypercholesteremia   . Hypertension    benign  . Memory loss   . PAF (paroxysmal atrial fibrillation) ([place]   . Vitamin B 12 deficiency    PSH: Past Surgical History:  Procedure Laterality Date  . ABDOMINAL HYSTERECTOMY    . CATARACT EXTRACTION Left 03/2015    Past Medical History:  Diagnosis Date  . [place] mellitus without complication ([facility]   . Hypercholesteremia   . Hypertension    benign  . Memory loss   . PAF ([place]oxysmal atrial fibrillation) ([place]   . Vitamin B 12 deficiency     Medications:  I have reviewed the patient's current medications.  Medications Prior to Admission  Medication Sig Dispense Refill  . atenolol (TENORMIN) 50 MG tablet Take 50 mg by mouth daily.  4  . chlorthalidone (HYGROTON) 25 MG tablet Take 1/2 tablet daily. (Patient taking differently: Take 12.5 mg by mouth daily as needed (leg swelling).) 45 tablet 3  . enalapril (VASOTEC) 10 MG tablet Take 1 tablet (10 mg total) by mouth once for 1 dose. (Patient taking differently: Take 10 mg by mouth daily.) 90 tablet 3   . gabapentin (NEURONTIN) 300 MG capsule Take 300 mg by mouth 3 (three) times daily.    . metFORMIN (GLUCOPHAGE) 500 MG tablet Take 1,000 mg by mouth 2 (two) times daily with a meal.  3  . pravastatin (PRAVACHOL) 40 MG tablet Take 40 mg by mouth daily.  4  . Blood Glucose Calibration (ONETOUCH VERIO) SOLN     . Blood Glucose Monitoring Suppl (OTallmadgeFLEX SYSTEM) w/Device KIT     . ONETOUCH VERIO test strip 1 each daily.      ALLERGIES:  No Known Allergies  FAM HX: Family History  Problem Relation Age of Onset  . Cancer Father        lung    Social History:   reports that she has never smoked. She has never used smokeless tobacco. She reports that she does not drink alcohol and does not use drugs.  ROS: 12 system ROS neg except per HPI  Blood pressure (!) 78/44, pulse 97, temperature 97.8 F (36.6 C), resp. rate (!) 28, height 5' 2"  (1.575 m), weight 57.5 kg, SpO2 98 %. PHYSICAL EXAM: Gen: awake and alert doing word find puzzle  Eyes:  EOMI ENT: MMM CV: BP noted on vasopressor and inotrope support Lungs: normal WOB on RA GU: no foley Extr:  No edema Neuro: alert interactive, not oriented (baseline)   Results for orders placed or  performed during the hospital encounter of 03/19/2020 (from the past 48 hour(s))  Magnesium     Status: None   Collection Time: 04/13/20 10:06 AM  Result Value Ref Range   Magnesium 2.4 1.7 - 2.4 mg/dL    Comment: Performed at [facility], [place] [address] [place], [place] [address]  Glucose, capillary     Status: Abnormal   Collection Time: 04/13/20 11:25 AM  Result Value Ref Range   Glucose-Capillary 207 (H) 70 - 99 mg/dL    Comment: Glucose reference range applies only to samples taken after fasting for at least 8 hours.  Glucose, capillary     Status: Abnormal   Collection Time: 04/13/20  4:04 PM  Result Value Ref Range   Glucose-Capillary 175 (H) 70 - 99 mg/dL    Comment: Glucose reference range applies only to samples taken  after fasting for at least 8 hours.  Glucose, capillary     Status: Abnormal   Collection Time: 04/13/20 10:04 PM  Result Value Ref Range   Glucose-Capillary 138 (H) 70 - 99 mg/dL    Comment: Glucose reference range applies only to samples taken after fasting for at least 8 hours.  Heparin level (unfractionated)     Status: Abnormal   Collection Time: 04/14/20 12:51 AM  Result Value Ref Range   Heparin Unfractionated 0.15 (L) 0.30 - 0.70 IU/mL    Comment: (NOTE) If heparin results are below expected values, and patient dosage has  been confirmed, suggest follow up testing of antithrombin III levels. Performed at [facility], [place] [address], [place], [place] [address]   CBC     Status: Abnormal   Collection Time: 04/14/20 12:51 AM  Result Value Ref Range   WBC 11.8 (H) 4.0 - 10.5 K/uL   RBC 3.78 (L) 3.87 - 5.11 MIL/uL   Hemoglobin 10.3 (L) 12.0 - 15.0 g/dL   HCT 31.1 (L) 36.0 - 46.0 %   MCV 82.3 80.0 - 100.0 fL   MCH 27.2 26.0 - 34.0 pg   MCHC 33.1 30.0 - 36.0 g/dL   RDW 14.2 11.5 - 15.5 %   Platelets 218 150 - 400 K/uL   nRBC 0.0 0.0 - 0.2 %    Comment: Performed at [facility], [place] [address] [place], [place] [address]  Basic metabolic panel     Status: Abnormal   Collection Time: 04/14/20 12:51 AM  Result Value Ref Range   Sodium 137 135 - 145 mmol/L   Potassium 3.3 (L) 3.5 - 5.1 mmol/L   Chloride 104 98 - 111 mmol/L   CO2 21 (L) 22 - 32 mmol/L   Glucose, Bld 166 (H) 70 - 99 mg/dL    Comment: Glucose reference range applies only to samples taken after fasting for at least 8 hours.   BUN 34 (H) 8 - 23 mg/dL   Creatinine, Ser 2.56 (H) 0.44 - 1.00 mg/dL   Calcium 8.1 (L) 8.9 - 10.3 mg/dL   GFR, Estimated 18 (L) >60 mL/min    Comment: (NOTE) Calculated using the CKD-EPI Creatinine Equation (2021)    Anion gap 12 5 - 15    Comment: Performed at [facility] [address] [place], [place] [address]  Glucose, capillary     Status: Abnormal    Collection Time: 04/14/20  8:12 AM  Result Value Ref Range   Glucose-Capillary 245 (H) 70 - 99 mg/dL    Comment: Glucose reference range applies only  to samples taken after fasting for at least 8 hours.  Urinalysis, Complete w Microscopic     Status: Abnormal   Collection Time: 04/14/20  9:35 AM  Result Value Ref Range   Color, Urine YELLOW YELLOW   APPearance HAZY (A) CLEAR   Specific Gravity, Urine 1.024 1.005 - 1.030   pH 5.0 5.0 - 8.0   Glucose, UA NEGATIVE NEGATIVE mg/dL   Hgb urine dipstick NEGATIVE NEGATIVE   Bilirubin Urine NEGATIVE NEGATIVE   Ketones, ur NEGATIVE NEGATIVE mg/dL   Protein, ur 30 (A) NEGATIVE mg/dL   Nitrite NEGATIVE NEGATIVE   Leukocytes,Ua LARGE (A) NEGATIVE   RBC / HPF 0-5 0 - 5 RBC/hpf   WBC, UA 0-5 0 - 5 WBC/hpf   Bacteria, UA RARE (A) NONE SEEN   Squamous Epithelial / LPF 0-5 0 - 5   Mucus PRESENT    Hyaline Casts, UA PRESENT     Comment: Performed at [facility], [address], [place], [place] [address]  Heparin level (unfractionated)     Status: Abnormal   Collection Time: 04/14/20 10:03 AM  Result Value Ref Range   Heparin Unfractionated 0.22 (L) 0.30 - 0.70 IU/mL    Comment: (NOTE) If heparin results are below expected values, and patient dosage has  been confirmed, suggest follow up testing of antithrombin III levels. Performed at [facility], [place] [address] [place], [place] [address]   Magnesium     Status: None   Collection Time: 04/14/20 10:03 AM  Result Value Ref Range   Magnesium 2.4 1.7 - 2.4 mg/dL    Comment: Performed at [place] [address], [place], [place] [address]  Glucose, capillary     Status: Abnormal   Collection Time: 04/14/20 11:38 AM  Result Value Ref Range   Glucose-Capillary 279 (H) 70 - 99 mg/dL    Comment: Glucose reference range applies only to samples taken after fasting for at least 8 hours.  Glucose, capillary     Status: Abnormal   Collection Time: 04/14/20  4:13 PM  Result  Value Ref Range   Glucose-Capillary 243 (H) 70 - 99 mg/dL    Comment: Glucose reference range applies only to samples taken after fasting for at least 8 hours.  Heparin level (unfractionated)     Status: None   Collection Time: 04/14/20  8:25 PM  Result Value Ref Range   Heparin Unfractionated 0.53 0.30 - 0.70 IU/mL    Comment: (NOTE) If heparin results are below expected values, and patient dosage has  been confirmed, suggest follow up testing of antithrombin III levels. Performed at [facility], [place] [address], [place], [place] [address]   Glucose, capillary     Status: Abnormal   Collection Time: 04/14/20 10:19 PM  Result Value Ref Range   Glucose-Capillary 223 (H) 70 - 99 mg/dL    Comment: Glucose reference range applies only to samples taken after fasting for at least 8 hours.  Heparin level (unfractionated)     Status: Abnormal   Collection Time: 04/15/20 12:52 AM  Result Value Ref Range   Heparin Unfractionated 0.71 (H) 0.30 - 0.70 IU/mL    Comment: (NOTE) If heparin results are below expected values, and patient dosage has  been confirmed, suggest follow up testing of antithrombin III levels. Performed at [facility], [place] [address] [place], [place] [address]   CBC     Status: Abnormal   Collection Time: 04/15/20  12:52 AM  Result Value Ref Range   WBC 13.3 (H) 4.0 - 10.5 K/uL   RBC 3.54 (L) 3.87 - 5.11 MIL/uL   Hemoglobin 9.6 (L) 12.0 - 15.0 g/dL   HCT 29.5 (L) 36.0 - 46.0 %   MCV 83.3 80.0 - 100.0 fL   MCH 27.1 26.0 - 34.0 pg   MCHC 32.5 30.0 - 36.0 g/dL   RDW 14.6 11.5 - 15.5 %   Platelets 229 150 - 400 K/uL   nRBC 0.0 0.0 - 0.2 %    Comment: Performed at [facility], [place] [address], [place], [place] [address]  Comprehensive metabolic panel     Status: Abnormal   Collection Time: 04/15/20 12:52 AM  Result Value Ref Range   Sodium 137 135 - 145 mmol/L   Potassium 4.3 3.5 - 5.1 mmol/L    Comment: DELTA CHECK NOTED   Chloride 101 98 - 111  mmol/L   CO2 17 (L) 22 - 32 mmol/L   Glucose, Bld 159 (H) 70 - 99 mg/dL    Comment: Glucose reference range applies only to samples taken after fasting for at least 8 hours.   BUN 49 (H) 8 - 23 mg/dL   Creatinine, Ser 3.84 (H) 0.44 - 1.00 mg/dL    Comment: DELTA CHECK NOTED   Calcium 8.3 (L) 8.9 - 10.3 mg/dL   Total Protein 5.8 (L) 6.5 - 8.1 g/dL   Albumin 2.6 (L) 3.5 - 5.0 g/dL   AST 255 (H) 15 - 41 U/L   ALT 109 (H) 0 - 44 U/L   Alkaline Phosphatase 99 38 - 126 U/L   Total Bilirubin 1.6 (H) 0.3 - 1.2 mg/dL   GFR, Estimated 11 (L) >60 mL/min    Comment: (NOTE) Calculated using the CKD-EPI Creatinine Equation (2021)    Anion gap 19 (H) 5 - 15    Comment: Performed at [facility], [place] [address], [place], [place] [address]  Magnesium     Status: None   Collection Time: 04/15/20 12:52 AM  Result Value Ref Range   Magnesium 2.4 1.7 - 2.4 mg/dL    Comment: Performed at [facility], [place] [address] [place], [place] [address]  Blood gas, venous     Status: Abnormal   Collection Time: 04/15/20  5:44 AM  Result Value Ref Range   FIO2 21.00    pH, Ven 7.363 7.250 - 7.430   pCO2, Ven 32.4 (L) 44.0 - 60.0 mmHg   pO2, Ven <31.0 (LL) 32.0 - 45.0 mmHg    Comment: CRITICAL RESULT CALLED TO, READ BACK BY AND VERIFIED WITH: R.CARWATER,RN 04/15/2020 0623 DAVISB    Bicarbonate 18.1 (L) 20.0 - 28.0 mmol/L   Acid-base deficit 6.3 (H) 0.0 - 2.0 mmol/L   O2 Saturation 47.4 %   Patient temperature 36.5    Drawn by [id number]    Sample type VENOUS     Comment: Performed at [facility], [place] [address] [place], [place] [address]  Glucose, capillary     Status: Abnormal   Collection Time: 04/15/20  6:13 AM  Result Value Ref Range   Glucose-Capillary 110 (H) 70 - 99 mg/dL    Comment: Glucose reference range applies only to samples taken after fasting for at least 8 hours.    DG CHEST PORT 1 VIEW  Result Date: 04/15/2020 CLINICAL DATA:  Dyspnea, myocardial infarction EXAM:  PORTABLE CHEST 1 VIEW COMPARISON:  04/14/2020 FINDINGS: Pulmonary insufflation is stable.  Small bilateral pleural effusions have developed. No pneumothorax. There is progressive perihilar pulmonary infiltrate most in keeping with trace pulmonary edema. Cardiac size is mildly enlarged. No acute bone abnormality. IMPRESSION: Progressive cardiogenic failure with increasing perihilar pulmonary edema and developing small bilateral pleural effusions. Stable cardiomegaly. Electronically Signed   By: Fidela Salisbury MD   On: 04/15/2020 05:57   DG Chest Port 1 View  Result Date: 04/14/2020 CLINICAL DATA:  Shortness of breath EXAM: PORTABLE CHEST 1 VIEW COMPARISON:  April 12, 2020 FINDINGS: There is no edema or airspace opacity. There has been interval clearing of atelectatic change from the left base. There is cardiomegaly with mild pulmonary venous hypertension. No adenopathy. Postoperative change noted in the upper thoracic region in the midline. IMPRESSION: No edema or airspace opacity. Interval clearing of left base atelectasis. There is cardiomegaly with a degree of pulmonary vascular congestion. Electronically Signed   By: Lowella Grip III M.D.   On: 04/14/2020 12:16    Assessment/Plan **STEMI and cardiogenic shock: late presentation being medically managed.  Per cardiology   **AKI, severe, anuric:  Secondary to cardiogenic shock.  Renal function has continued to deteriorate despite maximal support.  Had a nice detailed discussion with family today (son and daughter bedside) - she's not a candidate for dialysis and they agree it's not in her best interest and would not want to pursue.  Continue supportive care per cardiology.   Will sign off, please let me know if I can be of any assistance.   Justin Mend 04/15/2020, 9:18 AM

## 2020-04-15 NOTE — Progress Notes (Addendum)
Progress Note  Patient Name: Jamie Mueller Date of Encounter: 04/15/2020  [facility] Cardiologist: Rollene RotundaJames Hochrein, MD   Subjective    Initially yesterday, heart rate came down nicely with 12.5 mg metoprolol, blood pressure also came down to the 100s.  Converted from every 6 hours to twice daily. Amiodarone was increased in order to try to reduce heart rate. This morning is much less responsive, somewhat obtunded.  Overnight, blood pressures began to drop as heart rate came down.  This morning blood pressure in the 70s and heart rates in the 60s to 70s. Also noted was sharp increase in creatinine up to 3.86.  Less than 10 mL from in and out straight bladder cath indicating minimal urine output.    Inpatient Medications    Scheduled Meds: . aspirin EC  81 mg Oral Daily  . atorvastatin  40 mg Oral Daily  . Chlorhexidine Gluconate Cloth  6 each Topical Daily  . insulin aspart  0-9 Units Subcutaneous TID WC  . potassium chloride  10 mEq Oral Daily  . sodium chloride flush  10-40 mL Intracatheter Q12H  . ticagrelor  90 mg Oral BID   Continuous Infusions: . sodium chloride 250 mL (04/15/20 0844)  . heparin 950 Units/hr (04/15/20 0842)  . milrinone 0.25 mcg/kg/min (04/15/20 0700)  . norepinephrine (LEVOPHED) Adult infusion 2 mcg/min (04/15/20 0841)   PRN Meds: acetaminophen, nitroGLYCERIN, sodium chloride flush   Vital Signs    Vitals:   04/15/20 0500 04/15/20 0541 04/15/20 0600 04/15/20 0700  BP: (!) 83/54  (!) 84/53 (!) 70/51  Pulse: (!) 102  (!) 113 (!) 104  Resp: (!) 34  (!) 27 (!) 31  Temp:  97.8 F (36.6 C)    TempSrc:      SpO2: 99%  97% 100%  Weight:      Height:        Intake/Output Summary (Last 24 hours) at 04/15/2020 0856 Last data filed at 04/15/2020 0700 Gross per 24 hour  Intake 1150.69 ml  Output 30 ml  Net 1120.69 ml   Last 3 Weights 04/14/2020 04/13/2020 03/26/2020  Weight (lbs) 126 lb 12.2 oz 126 lb 8.7 oz 130 lb  Weight (kg) 57.5 kg 57.4  kg 58.968 kg      Telemetry    A. fib/flutter with rates in the 60s to 90s with occasional rarely conducted beats..- Personally Reviewed  ECG    No new EKG- Personally Reviewed  Physical Exam   General appearance: appears stated age, slowed mentation and Much less responsive.  Making more groaning movements.  Strong grip response, but not speaking. Neck: no carotid bruit and JVD roughly 8 cmH2O-however hard to examine due to constant movement Lungs: normal percussion bilaterally and Mild coarse breath sounds throughout, seems a little bit more labored, no respiratory stress.  Sats good. Heart: irregularly irregular rhythm and Normal rate.  No obvious rubs or gallops. Abdomen: soft, non-tender; bowel sounds normal; no masses,  no organomegaly Extremities: extremities normal, atraumatic, no cyanosis or edema Pulses: Significant diminished pulses likely due to reduced blood pressure. Neurologic: Mental status: She is awake, but not oriented.  Responds to some commands but not all.  Non verbal  Labs    High Sensitivity Troponin:   Recent Labs  Lab 03/26/2020 2217 03/23/2020 0238 03/31/2020 0602  TROPONINIHS >135,540* >27,000* >27,000*      Chemistry Recent Labs  Lab 04/12/2020 2134 03/18/2020 0238 04/13/20 0418 04/14/20 0051 04/15/20 0052  NA 138   < >  135 137 137  K 4.4   < > 3.7 3.3* 4.3  CL 104   < > 101 104 101  CO2 25   < > 24 21* 17*  GLUCOSE 239*   < > 291* 166* 159*  BUN 23   < > 31* 34* 49*  CREATININE 1.88*   < > 2.43* 2.56* 3.84*  CALCIUM 8.6*   < > 7.8* 8.1* 8.3*  PROT 6.9  --   --   --  5.8*  ALBUMIN 3.3*  --   --   --  2.6*  AST 467*  --   --   --  255*  ALT 55*  --   --   --  109*  ALKPHOS 75  --   --   --  99  BILITOT 0.7  --   --   --  1.6*  GFRNONAA 26*   < > 19* 18* 11*  ANIONGAP 9   < > 10 12 19*   < > = values in this interval not displayed.     Hematology Recent Labs  Lab 04/13/20 0418 04/14/20 0051 04/15/20 0052  WBC 9.7 11.8* 13.3*  RBC  3.70* 3.78* 3.54*  HGB 10.1* 10.3* 9.6*  HCT 30.5* 31.1* 29.5*  MCV 82.4 82.3 83.3  MCH 27.3 27.2 27.1  MCHC 33.1 33.1 32.5  RDW 14.1 14.2 14.6  PLT 221 218 229    BNPNo results for input(s): BNP, PROBNP in the last 168 hours.   DDimer No results for input(s): DDIMER in the last 168 hours.   Radiology    DG CHEST PORT 1 VIEW  Result Date: 04/15/2020 CLINICAL DATA:  Dyspnea, myocardial infarction EXAM: PORTABLE CHEST 1 VIEW COMPARISON:  04/14/2020 FINDINGS: Pulmonary insufflation is stable. Small bilateral pleural effusions have developed. No pneumothorax. There is progressive perihilar pulmonary infiltrate most in keeping with trace pulmonary edema. Cardiac size is mildly enlarged. No acute bone abnormality. IMPRESSION: Progressive cardiogenic failure with increasing perihilar pulmonary edema and developing small bilateral pleural effusions. Stable cardiomegaly. Electronically Signed   By: Helyn Numbers MD   On: 04/15/2020 05:57   DG Chest Port 1 View  Result Date: 04/14/2020 CLINICAL DATA:  Shortness of breath EXAM: PORTABLE CHEST 1 VIEW COMPARISON:  April 12, 2020 FINDINGS: There is no edema or airspace opacity. There has been interval clearing of atelectatic change from the left base. There is cardiomegaly with mild pulmonary venous hypertension. No adenopathy. Postoperative change noted in the upper thoracic region in the midline. IMPRESSION: No edema or airspace opacity. Interval clearing of left base atelectasis. There is cardiomegaly with a degree of pulmonary vascular congestion. Electronically Signed   By: Bretta Bang III M.D.   On: 04/14/2020 12:16    Cardiac Studies    ECHO 04/15/2020: EF 20 to 25%-mid and distal anterior wall, anterior septum and apex akinetic, anterolateral, inferior wall, posterior wall and mid inferoseptal segment basal anterior segment and basal inferoseptal segment hypokinetic.  (Suggestive of large LAD ischemia/infarction).  Moderate concentric  LVH.  GR 3 DD (restrictive)with elevated LAP -> severely dilated left atrium.;.  Moderate reduced RV function.  Mildly increased thickness.  Moderately elevated PAP.  Mildly dilated right atrium.  Small pericardial effusion.  Mild to moderate MR.  Moderate TR.  Mild aortic valve calcification-sclerosis but no stenosis.  Patient Profile     87 y.o. female with PMH notable for PAF, DM-2, HTN as well as early stages of dementia admitted with delayed presentation of  anterolateral STEMI in the setting of A. fib RVR.  She subsequently developed~cardiogenic shock with AKI, requiring inotrope support.  Per Dr. Royann Shivers, echocardiogram suggests potential cardiac amyloid..  Assessment & Plan    Principal Problem:   Acute ST elevation myocardial infarction (STEMI) of anterolateral wall (HCC) Active Problems:   PAF (paroxysmal atrial fibrillation) (HCC)   Atrial fibrillation with RVR (HCC)   Acute renal failure superimposed on stage 3a chronic kidney disease (HCC)   Cardiogenic shock (HCC)   Ischemic cardiomyopathy   Acute combined systolic and diastolic heart failure (HCC)   Type 2 diabetes mellitus with other circulatory complications (HCC)   Essential hypertension   Hyperlipidemia associated with type 2 diabetes mellitus (HCC)   Hypomagnesemia   1.  Acute anterolateral STEMI: Echo findings consistent with LAD stenosis related STEMI.  Delayed presentation => Per discussion with on-call interventionalists, plan medical management. ischemic cardiomyopathy/quite clearly has coronary disease-no heart catheterization done because of completed infarct and no active symptoms.  Suspect proximal LAD occlusion.  LVEF 20 to 25%.  Concerning persistent ST elevation suggesting possible apical aneurysm.  At risk for mechanical and arrhythmia genic complications of MI.  Plan was to complete 72 hours IV heparin along with aspirin and Brilinta.  However, with her worsening renal function we will continue IV  heparin for A. fib.  Did not tolerate beta-blocker, have DC'd.  Continue milrinone and start low-dose Levophed for hypotension.  40 mg atorvastatin  2. Cardiogenic shock/ischemic cardiomyopathy/acute combined systolic and diastolic heart failure: Initial CoOx was 45%. -Now more consistent with shock-blood pressures in the 60s and 70s.  VBG in the 30s yesterday.  Not accurate for CoOx, but does indicate a low pump function.  Blood pressures are actually also lower today, perhaps combination of decreased heart rate and amiodarone effect.  We will stop amiodarone for now, continue milrinone and initiate Levophed.  Also give small IV fluid bolus.  Renal function initially improved, but now has gotten persistently worse now at 3.86.  No response to the IV Lasix yesterday.  We will actually give fluid challenge today.   2. AFib w RVR/now in AFlutter: Difficult rate control;  Rate much more controlled.  Will hold off on IV amiodarone.  She has been fully loaded.  Will allow time for some washout and blood pressure to recover.  Now requiring IV Levophed along with milrinone.  Continue IV heparin until stabilized which time we can potentially convert to DOAC.  We will hold off until we decide whether or not she needs to be cardioverted.  Unfortunate, she may have required the heart rate for improved cardiac output-> indicating low stroke volume.   3. Acute on chronic renal insuff:  Rapidly rising BUN/creat. Baseline uncertain (creat 1.3 in 2019).  Creatinine now 3.8.  Clearly related to reduced blood pressure and low cardiac output/cardiogenic shock.  Also complicated by poor p.o. intake.  Will give IV bolus today.  Continue milrinone but also add Levophed.  ACE inhibitor or ARB on hold.  Metformin on hold.  We have consulted nephrology, but I suspect she is not likely a good candidate for either CRRT or HD.  She pulled out her PICC line earlier.  Had a discussion with the son at  bedside.  He now understands the level of her renal failure.  Unfortunately this may be the deciding factor as far as her trajectory.   4. HTN: Now frankly hypotensive/shock.  Continue milrinone and add Levophed.  ~50 mL bolus.  5.  DM-2: well controlled, A1c 6.7%. ->  Sliding scale insulin.  6. HypoMg (hypokalemia):  Replete as needed  7.  No more fever.  8.  Dementia/delirium => definitely delirious now.  Affected by low blood pressure.  This is deafly complicating factors.  Nutrition status seems initiated, she is not really eating and drinking very well, coughing whenever she tries to eat.  We will get a swallow evaluation, suspect that she may not be able to do thin liquids.  Since she is not drinking well we will give her gentle IV fluids also able to get another IV in her.   I had a brief conversation with the patient's son today.  Will hopefully talk to the rest of the family about her trajectory.  At this point that we need to clarify CODE STATUS first.  Would likely give at least another day to see the direction of her renal function if any urine output occurs.  If she continues to have worsening renal function, I think that we need to start document palliative care.   CRITICAL CARE Performed by: Bryan Lemma   Total critical care time: 45 mean minutes  Critical care time was exclusive of separately billable procedures and treating other patients.  Critical care was necessary to treat or prevent imminent or life-threatening deterioration.  Critical care was time spent personally by me on the following activities: development of treatment plan with patient and/or surrogate as well as nursing, discussions with consultants and pharmacist team, evaluation of patient's response to treatment, examination of patient, obtaining history from patient or surrogate, ordering and performing treatments and interventions, ordering and review of laboratory studies, ordering and review of  radiographic studies, pulse oximetry and re-evaluation of patient's condition.   For questions or updates, please contact [facility] Please consult [email or web address] for contact info under        Signed, Bryan Lemma, MD  04/15/2020, 8:56 AM

## 2020-04-15 NOTE — Progress Notes (Signed)
ANTICOAGULATION CONSULT NOTE  Pharmacy Consult for Heparin Indication: chest pain/ACS; also w/ afib  No Known Allergies  Patient Measurements: Height: 5' 2"  (157.5 cm) Weight: 57.5 kg (126 lb 12.2 oz) IBW/kg (Calculated) : 50.1     Vital Signs: Temp: 97.6 F (36.4 C) (03/29 1623) Temp Source: Oral (03/29 1623) BP: 106/92 (03/29 1600) Pulse Rate: 118 (03/29 1400)  Labs: Recent Labs    04/13/20 0418 04/14/20 0051 04/14/20 1003 04/14/20 2025 04/15/20 0052 04/15/20 1642  HGB 10.1* 10.3*  --   --  9.6*  --   HCT 30.5* 31.1*  --   --  29.5*  --   PLT 221 218  --   --  229  --   HEPARINUNFRC 0.32 0.15*   < > 0.53 0.71* 0.83*  CREATININE 2.43* 2.56*  --   --  3.84*  --    < > = values in this interval not displayed.    Estimated Creatinine Clearance: 8.2 mL/min (A) (by C-G formula based on SCr of 3.84 mg/dL (H)).   Medical History: Past Medical History:  Diagnosis Date  . Diabetes mellitus without complication ([place])   . Hypercholesteremia   . Hypertension    benign  . Memory loss   . PAF (paroxysmal atrial fibrillation) ([place])   . Vitamin B 12 deficiency     Medications:  No current facility-administered medications on file prior to encounter.   Current Outpatient Medications on File Prior to Encounter  Medication Sig Dispense Refill  . atenolol (TENORMIN) 50 MG tablet Take 50 mg by mouth daily.  4  . chlorthalidone (HYGROTON) 25 MG tablet Take 1/2 tablet daily. (Patient taking differently: Take 12.5 mg by mouth daily as needed (leg swelling).) 45 tablet 3  . enalapril (VASOTEC) 10 MG tablet Take 1 tablet (10 mg total) by mouth once for 1 dose. (Patient taking differently: Take 10 mg by mouth daily.) 90 tablet 3  . gabapentin (NEURONTIN) 300 MG capsule Take 300 mg by mouth 3 (three) times daily.    . metFORMIN (GLUCOPHAGE) 500 MG tablet Take 1,000 mg by mouth 2 (two) times daily with a meal.  3  . pravastatin (PRAVACHOL) 40 MG tablet Take 40 mg by mouth daily.  4   . Blood Glucose Calibration (ONETOUCH VERIO) SOLN     . Blood Glucose Monitoring Suppl ([place] FLEX SYSTEM) w/Device KIT     . ONETOUCH VERIO test strip 1 each daily.       Assessment: 87 y.o. female admitted with STEMI for heparin.  Also has atrial fibrillation, not on anticoagulation at admission although reports Eliquis in the past.  Heparin level is slightly supratherapeutic at 0.8 on heparin drip rate 950 uts/hr. CBC stable. No s/sx bleeding reported.  Per Cardiology, no plan for cath, now > 72 hours on heparin s/p ACS, plan to continue heparin for afib given renal function worsening.  Goal of Therapy:  Heparin level 0.3-0.7 units/ml Monitor platelets by anticoagulation protocol: Yes   Plan:  Decrease IV heparin to 750 units/hr Daily heparin level and CBC F/u plans for long term anticoagulation for afib    Bonnita Nasuti Pharm.D. CPP, BCPS Clinical Pharmacist [phone] 04/15/2020 5:42 PM    Please see AMION for all pharmacy numbers

## 2020-04-15 NOTE — Consult Note (Signed)
NAME:  Jamie Mueller, MRN:  [id number], DOB:  1930-10-01, LOS: 3 ADMISSION DATE:  03/18/2020, CONSULTATION DATE:  04/15/2020 REFERRING MD:  Dr. Ellyn Hack, CHIEF COMPLAINT:  Agitation  History of Present Illness:   87 year old female with prior history of hypertension, diabetes, hyperlipidemia, A. Fib (not on AC), and dementia recently ended with abdominal pain admitted with A. fib with RVR, inferolateral STEMI, and AKI.  Cardiology elected for medical management.  Hospitalization complicated by progressive anuric AKI and cardiogenic shock.  Nephrology consulted, however not a candidate for dialysis.  She has been supported on milrinone and norepinephrine.  She has had ongoing confusion to the point of pulling out her PICC on 3/28.  On 3/29 she had worsening hypotension now and Afib with RVR with increasing confusion, remains on NE and amiodarone gtts.  PCCM consulted to assist with further medical management and agitation.   Pertinent  Medical History  Hypertension, diabetes, hyperlipidemia, A. Fib (not on [facility]), dementia, CAD  Significant Hospital Events: Including procedures, antibiotic start and stop dates in addition to other pertinent events   . 3/25 admitted cardiology  Interim History / Subjective:   Objective   Blood pressure (!) 106/92, pulse (!) 118, temperature 97.6 F (36.4 C), temperature source Oral, resp. rate (!) 40, height 5' 2"  (1.575 m), weight 57.5 kg, SpO2 100 %.        Intake/Output Summary (Last 24 hours) at 04/15/2020 1758 Last data filed at 04/15/2020 1600 Gross per 24 hour  Intake 1328.36 ml  Output 32 ml  Net 1296.36 ml   Filed Weights   04/06/2020 2035 04/13/20 0500 04/14/20 0500  Weight: 59 kg 57.4 kg 57.5 kg    Examination: General:  Pleasant elderly female in mild distress HEENT: MM pink/moist, pupils 3/reactive Neuro: Awake, orirented to person/ place not year, MAE, follows commands, pleasantly confused and hallucinating CV: IRIR PULM:  Mild  tachypnea, slight exp wheeze, diminished in bases, faint transmitted upper airway wheeze GI: soft, bs active, purwick Extremities: warm/dry, no LE edema  Skin: no rashes  Labs/imaging that I havepersonally reviewed  (right click and "Reselect all SmartList Selections" daily)  CMET, CBC, heparin level, CBGs  Resolved Hospital Problem list    Assessment & Plan:   Cardiogenic shock Inferolateral STEMI- medical management only ICM Transaminitis  - LVEF 20-25% - reduce milrinone to 0.125 given renal function - ongoing heparin per pharmacy  - NE for MAP goal > 65, currently on 8 mcg/min peripherally.  Spoke with Dr. Augustin Coupe with Nephrology r/t replacing PICC given she is not an HD candidate, agrees that PICC is ok.   PICC team re-consulted.   - once PICC placed can restart co-ox's - ASA, brilinta, lipitor - trend LFTs  PAF - per cardiology - amiodarone - heparin per pharmacy   AKI, now anuric  - Nephrology seeing, not a HD candidate, rising sCr - supportive care - trend renal indices and UOP  DM - SSI   Agitated delirium / Dementia  - low dose precedex, monitoring hemodynamics closely - delirium precautions - no other signs of infection/ fever to be contributing at this time, continue to clinically monitor at this time - check ammonia in am   Poor PO intake/ at risk for malnutrition - evaluated by SLP 3/29, placed on dysphagia 2 diet - aspiration precautions  Best practice (right click and "Reselect all SmartList Selections" daily)  Diet:  Oral Pain/Anxiety/Delirium protocol (if indicated): Yes (RASS goal 0) VAP protocol (if indicated): Not indicated  DVT prophylaxis: Systemic AC GI prophylaxis: PPI Glucose control:  SSI Yes Central venous access:  N/A Arterial line:  N/A Foley:  N/A Mobility:  bed rest  PT consulted: N/A Last date of multidisciplinary goals of care discussion [3/29 per primary] Code Status:  DNR as of 3/29 Disposition: Cardiac ICU  Labs    CBC: Recent Labs  Lab 04/14/2020 2134 04/09/2020 0238 04/13/20 0418 04/14/20 0051 04/15/20 0052  WBC 6.5 6.6 9.7 11.8* 13.3*  HGB 11.6* 11.5* 10.1* 10.3* 9.6*  HCT 37.0 36.8 30.5* 31.1* 29.5*  MCV 85.3 85.4 82.4 82.3 83.3  PLT 277 254 221 218 638    Basic Metabolic Panel: Recent Labs  Lab 04/08/2020 2134 03/30/2020 0238 04/13/20 0418 04/13/20 1006 04/14/20 0051 04/14/20 1003 04/15/20 0052  NA 138 141 135  --  137  --  137  K 4.4 4.5 3.7  --  3.3*  --  4.3  CL 104 104 101  --  104  --  101  CO2 25 24 24   --  21*  --  17*  GLUCOSE 239* 210* 291*  --  166*  --  159*  BUN 23 24* 31*  --  34*  --  49*  CREATININE 1.88* 2.03* 2.43*  --  2.56*  --  3.84*  CALCIUM 8.6* 8.2* 7.8*  --  8.1*  --  8.3*  MG  --  1.4*  --  2.4  --  2.4 2.4   GFR: Estimated Creatinine Clearance: 8.2 mL/min (A) (by C-G formula based on SCr of 3.84 mg/dL (H)). Recent Labs  Lab 03/24/2020 0238 04/13/20 0418 04/14/20 0051 04/15/20 0052  WBC 6.6 9.7 11.8* 13.3*    Liver Function Tests: Recent Labs  Lab 04/05/2020 2134 04/15/20 0052  AST 467* 255*  ALT 55* 109*  ALKPHOS 75 99  BILITOT 0.7 1.6*  PROT 6.9 5.8*  ALBUMIN 3.3* 2.6*   Recent Labs  Lab 03/19/2020 2134  LIPASE 39   No results for input(s): AMMONIA in the last 168 hours.  ABG    Component Value Date/Time   HCO3 18.1 (L) 04/15/2020 0544   TCO2 28 08/07/2010 1547   ACIDBASEDEF 6.3 (H) 04/15/2020 0544   O2SAT 47.4 04/15/2020 0544     Coagulation Profile: Recent Labs  Lab 03/31/2020 2134  INR 1.0    Cardiac Enzymes: No results for input(s): CKTOTAL, CKMB, CKMBINDEX, TROPONINI in the last 168 hours.  HbA1C: Hgb A1c MFr Bld  Date/Time Value Ref Range Status  03/30/2020 02:38 AM 6.7 (H) 4.8 - 5.6 % Final    Comment:    (NOTE) Pre diabetes:          5.7%-6.4%  Diabetes:              >6.4%  Glycemic control for   <7.0% adults with diabetes   04/10/2020 09:34 PM 6.7 (H) 4.8 - 5.6 % Final    Comment:    (NOTE) Pre  diabetes:          5.7%-6.4%  Diabetes:              >6.4%  Glycemic control for   <7.0% adults with diabetes     CBG: Recent Labs  Lab 04/14/20 1613 04/14/20 2219 04/15/20 0613 04/15/20 1051 04/15/20 1620  GLUCAP 243* 223* 110* 132* 175*    Review of Systems:   Unable given patients encephalopathy   Past Medical History:  She,  has a past medical history of Diabetes mellitus without complication (  [place]), Hypercholesteremia, Hypertension, Memory loss, PAF (paroxysmal atrial fibrillation) ([place]), and Vitamin B 12 deficiency.   Surgical History:   Past Surgical History:  Procedure Laterality Date  . ABDOMINAL HYSTERECTOMY    . CATARACT EXTRACTION Left 03/2015     Social History:   reports that she has never smoked. She has never used smokeless tobacco. She reports that she does not drink alcohol and does not use drugs.   Family History:  Her family history includes Cancer in her father.   Allergies No Known Allergies   Home Medications  Prior to Admission medications   Medication Sig Start Date End Date Taking? Authorizing Provider  atenolol (TENORMIN) 50 MG tablet Take 50 mg by mouth daily. 11/05/14  Yes [provider]  chlorthalidone (HYGROTON) 25 MG tablet Take 1/2 tablet daily. Patient taking differently: Take 12.5 mg by mouth daily as needed (leg swelling). 10/26/18  Yes Minus Breeding, MD  enalapril (VASOTEC) 10 MG tablet Take 1 tablet (10 mg total) by mouth once for 1 dose. Patient taking differently: Take 10 mg by mouth daily. 10/26/18 10/26/18 Yes Minus Breeding, MD  gabapentin (NEURONTIN) 300 MG capsule Take 300 mg by mouth 3 (three) times daily.   Yes [provider]  metFORMIN (GLUCOPHAGE) 500 MG tablet Take 1,000 mg by mouth 2 (two) times daily with a meal. 10/16/14  Yes [provider]  pravastatin (PRAVACHOL) 40 MG tablet Take 40 mg by mouth daily. 11/16/14  Yes [provider]  Blood Glucose Calibration (ONETOUCH  VERIO) SOLN  01/22/20   [provider]  Blood Glucose Monitoring Suppl ([place]) w/Device KIT  01/22/20   [provider]  [facility] VERIO test strip 1 each daily. 01/22/20   [provider]     Critical care time: 37 mins     Kennieth Rad, ACNP [facility] 04/15/2020, 6:46 PM

## 2020-04-15 NOTE — Plan of Care (Signed)
  Problem: Education: Goal: Knowledge of General Education information will improve Description: Including pain rating scale, medication(s)/side effects and non-pharmacologic comfort measures Outcome: Progressing   Problem: Health Behavior/Discharge Planning: Goal: Ability to manage health-related needs will improve Outcome: Progressing   Problem: Clinical Measurements: Goal: Ability to maintain clinical measurements within normal limits will improve Outcome: Progressing Goal: Will remain free from infection Outcome: Progressing Goal: Diagnostic test results will improve Outcome: Progressing Goal: Respiratory complications will improve Outcome: Progressing Goal: Cardiovascular complication will be avoided Outcome: Progressing   Problem: Activity: Goal: Risk for activity intolerance will decrease Outcome: Progressing   Problem: Nutrition: Goal: Adequate nutrition will be maintained Outcome: Progressing   Problem: Coping: Goal: Level of anxiety will decrease Outcome: Progressing   Problem: Elimination: Goal: Will not experience complications related to bowel motility Outcome: Progressing Goal: Will not experience complications related to urinary retention Outcome: Progressing   Problem: Pain Managment: Goal: General experience of comfort will improve Outcome: Progressing   Problem: Safety: Goal: Ability to remain free from injury will improve Outcome: Progressing   Problem: Skin Integrity: Goal: Risk for impaired skin integrity will decrease Outcome: Progressing   Problem: Cardiac: Goal: Ability to achieve and maintain adequate cardiopulmonary perfusion will improve Outcome: Progressing   Problem: Activity: Goal: Ability to tolerate increased activity will improve Outcome: Progressing   Problem: Cardiac: Goal: Ability to achieve and maintain adequate cardiopulmonary perfusion will improve Outcome: Progressing   Problem: Health Behavior/Discharge  Planning: Goal: Ability to safely manage health-related needs after discharge will improve Outcome: Progressing

## 2020-04-15 NOTE — Progress Notes (Signed)
Patient is choking and coughing on water and oatmeal, swallow study order placed. PO meds held until swallow safety can be established.

## 2020-04-15 NOTE — Plan of Care (Signed)
  Problem: Education: Goal: Knowledge of General Education information will improve Description: Including pain rating scale, medication(s)/side effects and non-pharmacologic comfort measures Outcome: Progressing   Problem: Health Behavior/Discharge Planning: Goal: Ability to manage health-related needs will improve Outcome: Progressing   Problem: Clinical Measurements: Goal: Ability to maintain clinical measurements within normal limits will improve Outcome: Progressing Goal: Will remain free from infection Outcome: Progressing Goal: Diagnostic test results will improve Outcome: Progressing Goal: Respiratory complications will improve Outcome: Progressing Goal: Cardiovascular complication will be avoided Outcome: Progressing   Problem: Activity: Goal: Risk for activity intolerance will decrease Outcome: Progressing   Problem: Nutrition: Goal: Adequate nutrition will be maintained Outcome: Progressing   Problem: Coping: Goal: Level of anxiety will decrease Outcome: Progressing   Problem: Elimination: Goal: Will not experience complications related to bowel motility Outcome: Progressing Goal: Will not experience complications related to urinary retention Outcome: Progressing   Problem: Pain Managment: Goal: General experience of comfort will improve Outcome: Progressing   Problem: Safety: Goal: Ability to remain free from injury will improve Outcome: Progressing   Problem: Skin Integrity: Goal: Risk for impaired skin integrity will decrease Outcome: Progressing   Problem: Cardiac: Goal: Ability to achieve and maintain adequate cardiopulmonary perfusion will improve Outcome: Progressing   Problem: Activity: Goal: Ability to tolerate increased activity will improve Outcome: Progressing   Problem: Cardiac: Goal: Ability to achieve and maintain adequate cardiopulmonary perfusion will improve Outcome: Progressing   Problem: Health Behavior/Discharge  Planning: Goal: Ability to safely manage health-related needs after discharge will improve Outcome: Progressing   

## 2020-04-15 NOTE — Progress Notes (Addendum)
[facility] for Heparin Indication: chest pain/ACS; also w/ afib  No Known Allergies  Patient Measurements: Height: 5' 2"  (157.5 cm) Weight: 57.5 kg (126 lb 12.2 oz) IBW/kg (Calculated) : 50.1     Vital Signs: Temp: 97.8 F (36.6 C) (03/29 0541) Temp Source: Axillary (03/28 2310) BP: 83/54 (03/29 0500) Pulse Rate: 102 (03/29 0500)  Labs: Recent Labs    04/13/20 0418 04/14/20 0051 04/14/20 1003 04/14/20 2025 04/15/20 0052  HGB 10.1* 10.3*  --   --  9.6*  HCT 30.5* 31.1*  --   --  29.5*  PLT 221 218  --   --  229  HEPARINUNFRC 0.32 0.15* 0.22* 0.53 0.71*  CREATININE 2.43* 2.56*  --   --  3.84*    Estimated Creatinine Clearance: 8.2 mL/min (A) (by C-G formula based on SCr of 3.84 mg/dL (H)).   Medical History: Past Medical History:  Diagnosis Date  . Diabetes mellitus without complication ([place])   . Hypercholesteremia   . Hypertension    benign  . Memory loss   . PAF (paroxysmal atrial fibrillation) ([place])   . Vitamin B 12 deficiency     Medications:  No current facility-administered medications on file prior to encounter.   Current Outpatient Medications on File Prior to Encounter  Medication Sig Dispense Refill  . atenolol (TENORMIN) 50 MG tablet Take 50 mg by mouth daily.  4  . chlorthalidone (HYGROTON) 25 MG tablet Take 1/2 tablet daily. (Patient taking differently: Take 12.5 mg by mouth daily as needed (leg swelling).) 45 tablet 3  . enalapril (VASOTEC) 10 MG tablet Take 1 tablet (10 mg total) by mouth once for 1 dose. (Patient taking differently: Take 10 mg by mouth daily.) 90 tablet 3  . gabapentin (NEURONTIN) 300 MG capsule Take 300 mg by mouth 3 (three) times daily.    . metFORMIN (GLUCOPHAGE) 500 MG tablet Take 1,000 mg by mouth 2 (two) times daily with a meal.  3  . pravastatin (PRAVACHOL) 40 MG tablet Take 40 mg by mouth daily.  4  . Blood Glucose Calibration (ONETOUCH VERIO) SOLN     . Blood Glucose Monitoring  Suppl ([place] FLEX SYSTEM) w/Device KIT     . ONETOUCH VERIO test strip 1 each daily.       Assessment: 87 y.o. female admitted with STEMI for heparin.  Also has atrial fibrillation, not on anticoagulation at admission although reports Eliquis in the past.  Heparin level is slightly supratherapeutic at 0.71 at 1000 units/hr. CBC stable. No s/sx bleeding reported.  Per Cardiology, no plan for cath, now > 72 hours on heparin, plan to continue heparin for afib given renal function worsening.  Goal of Therapy:  Heparin level 0.3-0.7 units/ml Monitor platelets by anticoagulation protocol: Yes   Plan:  Decrease IV heparin to 950 units/hr Heparin level in 8 hours Daily heparin level and CBC F/u plans for long term anticoagulation for afib  Fara Olden, PharmD PGY-1 Pharmacy Resident 04/15/2020 7:09 AM Please see AM[facility] for all pharmacy numbers

## 2020-04-15 NOTE — Evaluation (Signed)
Clinical/Bedside Swallow Evaluation Patient Details  Name: Jamie Mueller MRN: [id number] Date of Birth: 1931-01-16  Today's Date: 04/15/2020 Time: SLP Start Time (ACUTE ONLY): 1552 SLP Stop Time (ACUTE ONLY): 1613 SLP Time Calculation (min) (ACUTE ONLY): 21 min  Past Medical History:  Past Medical History:  Diagnosis Date  . Diabetes mellitus without complication ([facility])   . Hypercholesteremia   . Hypertension    benign  . Memory loss   . PAF (paroxysmal atrial fibrillation) ([facility])   . Vitamin B 12 deficiency    Past Surgical History:  Past Surgical History:  Procedure Laterality Date  . ABDOMINAL HYSTERECTOMY    . CATARACT EXTRACTION Left 03/2015   HPI:  Pt is an 87 yo female presenting with STEMI in the setting of afib with RVR. She subsequently developed cardiogenic shock with AKI. Swallowing evaluation was ordered due to reports of coughing with thin liquids and purees at breakfast on 3/29. PMH includes: PAF, DMII, HTN, memory loss   Assessment / Plan / Recommendation Clinical Impression  Clinical swallowing evaluation was limited due to pt refusing most of the boluses presented; however, coughing that immediately followed her only sip of thin liquids could be indicative of decreased airway protection, particularly when considering that pt was also reported to be coughing with thin liquids this morning. Pt did not have any immediate coughing during trials of nectar thick liquids or solids, but she begins to show more potential oral deficits as boluses become more solid. Her oral transit becomes longer, and she has mild oral residue with soft solids. Based on what could be observed, recommend Dys 2 (finely chopped) diet with nectar thick liquids. Education was provided to family about potential s/s of aspiration for which to monitor, and they will benefit from additional training as we continue to f/u for differential diagnosis of swallowing abilities. SLP Visit Diagnosis:  Dysphagia, unspecified (R13.10)    Aspiration Risk  Mild aspiration risk;Risk for inadequate nutrition/hydration    Diet Recommendation Dysphagia 2 (Fine chop);Nectar-thick liquid   Liquid Administration via: Cup;Straw Medication Administration: Crushed with puree Supervision: Staff to assist with self feeding;Full supervision/cueing for compensatory strategies Compensations: Minimize environmental distractions;Slow rate;Small sips/bites Postural Changes: Seated upright at 90 degrees    Other  Recommendations Oral Care Recommendations: Oral care BID Other Recommendations: Order thickener from pharmacy;Prohibited food (jello, ice cream, thin soups);Remove water pitcher   Follow up Recommendations  (tba)      Frequency and Duration min 2x/week  2 weeks       Prognosis Prognosis for Safe Diet Advancement: Fair Barriers to Reach Goals: Motivation;Cognitive deficits      Swallow Study   General HPI: Pt is an 87 yo female presenting with STEMI in the setting of afib with RVR. She subsequently developed cardiogenic shock with AKI. Swallowing evaluation was ordered due to reports of coughing with thin liquids and purees at breakfast on 3/29. PMH includes: PAF, DMII, HTN, memory loss Type of Study: Bedside Swallow Evaluation Previous Swallow Assessment: none in chart Diet Prior to this Study: Regular;Thin liquids Temperature Spikes Noted: No Respiratory Status: Room air History of Recent Intubation: No Behavior/Cognition: Alert;Requires cueing Oral Cavity Assessment: Within Functional Limits Oral Care Completed by SLP: No Oral Cavity - Dentition: Dentures, top;Dentures, bottom Vision: Functional for self-feeding Self-Feeding Abilities: Needs assist Patient Positioning: Upright in bed Baseline Vocal Quality: Normal    Oral/Motor/Sensory Function Overall Oral Motor/Sensory Function: Other (comment) (appears to be [facility], but did not follow all instructions for completion)  Ice Chips  Ice chips: Not tested   Thin Liquid Thin Liquid: Impaired Presentation: Straw Pharyngeal  Phase Impairments: Cough - Immediate    Nectar Thick Nectar Thick Liquid: Within functional limits Presentation: Straw   Honey Thick Honey Thick Liquid: Not tested   Puree Puree: Impaired Presentation: Spoon Oral Phase Functional Implications: Prolonged oral transit   Solid     Solid: Impaired Oral Phase Impairments: Impaired mastication Oral Phase Functional Implications: Prolonged oral transit;Oral residue       Mahala Menghini., M.A. CCC-SLP Acute Rehabilitation Services Pager [phone] Office [phone]  04/15/2020,4:59 PM

## 2020-04-16 ENCOUNTER — Inpatient Hospital Stay (HOSPITAL_COMMUNITY): Payer: Medicare Other

## 2020-04-16 DIAGNOSIS — I5041 Acute combined systolic (congestive) and diastolic (congestive) heart failure: Secondary | ICD-10-CM | POA: Diagnosis not present

## 2020-04-16 DIAGNOSIS — I2109 ST elevation (STEMI) myocardial infarction involving other coronary artery of anterior wall: Secondary | ICD-10-CM | POA: Diagnosis not present

## 2020-04-16 DIAGNOSIS — I4891 Unspecified atrial fibrillation: Secondary | ICD-10-CM | POA: Diagnosis not present

## 2020-04-16 DIAGNOSIS — N170 Acute kidney failure with tubular necrosis: Secondary | ICD-10-CM | POA: Diagnosis not present

## 2020-04-16 LAB — CBC
HCT: 26.7 % — ABNORMAL LOW (ref 36.0–46.0)
Hemoglobin: 9.2 g/dL — ABNORMAL LOW (ref 12.0–15.0)
MCH: 27.1 pg (ref 26.0–34.0)
MCHC: 34.5 g/dL (ref 30.0–36.0)
MCV: 78.8 fL — ABNORMAL LOW (ref 80.0–100.0)
Platelets: 221 10*3/uL (ref 150–400)
RBC: 3.39 MIL/uL — ABNORMAL LOW (ref 3.87–5.11)
RDW: 14.2 % (ref 11.5–15.5)
WBC: 13.1 10*3/uL — ABNORMAL HIGH (ref 4.0–10.5)
nRBC: 0.5 % — ABNORMAL HIGH (ref 0.0–0.2)

## 2020-04-16 LAB — GLUCOSE, CAPILLARY
Glucose-Capillary: 287 mg/dL — ABNORMAL HIGH (ref 70–99)
Glucose-Capillary: 258 mg/dL — ABNORMAL HIGH (ref 70–99)
Glucose-Capillary: 162 mg/dL — ABNORMAL HIGH (ref 70–99)

## 2020-04-16 LAB — COMPREHENSIVE METABOLIC PANEL
ALT: 84 U/L — ABNORMAL HIGH (ref 0–44)
AST: 126 U/L — ABNORMAL HIGH (ref 15–41)
Albumin: 2.5 g/dL — ABNORMAL LOW (ref 3.5–5.0)
Alkaline Phosphatase: 100 U/L (ref 38–126)
Anion gap: 15 (ref 5–15)
BUN: 66 mg/dL — ABNORMAL HIGH (ref 8–23)
CO2: 17 mmol/L — ABNORMAL LOW (ref 22–32)
Calcium: 7.7 mg/dL — ABNORMAL LOW (ref 8.9–10.3)
Chloride: 101 mmol/L (ref 98–111)
Creatinine, Ser: 4.87 mg/dL — ABNORMAL HIGH (ref 0.44–1.00)
GFR, Estimated: 8 mL/min — ABNORMAL LOW (ref >60)
Glucose, Bld: 234 mg/dL — ABNORMAL HIGH (ref 70–99)
Potassium: 4.6 mmol/L (ref 3.5–5.1)
Sodium: 133 mmol/L — ABNORMAL LOW (ref 135–145)
Total Bilirubin: 1.9 mg/dL — ABNORMAL HIGH (ref 0.3–1.2)
Total Protein: 5.4 g/dL — ABNORMAL LOW (ref 6.5–8.1)

## 2020-04-16 LAB — COOXEMETRY PANEL
Carboxyhemoglobin: 1.2 % (ref 0.5–1.5)
Methemoglobin: 0.7 % (ref 0.0–1.5)
O2 Saturation: 63.2 %
Total hemoglobin: 9.5 g/dL — ABNORMAL LOW (ref 12.0–16.0)

## 2020-04-16 LAB — HEPARIN LEVEL (UNFRACTIONATED): Heparin Unfractionated: 0.47 IU/mL (ref 0.30–0.70)

## 2020-04-16 MED ORDER — PHENTOLAMINE MESYLATE 5 MG IJ SOLR
5.0000 mg | Freq: Once | INTRAMUSCULAR | Status: AC
  Administered 2020-04-16: 5 mg via SUBCUTANEOUS
  Filled 2020-04-16: qty 5

## 2020-04-16 MED ORDER — NOREPINEPHRINE 4 MG/250ML-% IV SOLN
0.0000 ug/min | INTRAVENOUS | Status: DC
  Administered 2020-04-16 (×2): 12 ug/min via INTRAVENOUS
  Administered 2020-04-16: 7 ug/min via INTRAVENOUS
  Filled 2020-04-16 (×2): qty 250

## 2020-04-16 MED ORDER — AMIODARONE HCL IN DEXTROSE 360-4.14 MG/200ML-% IV SOLN
30.0000 mg/h | INTRAVENOUS | Status: DC
  Administered 2020-04-16: 30 mg/h via INTRAVENOUS
  Administered 2020-04-16 (×2): 60 mg/h via INTRAVENOUS
  Administered 2020-04-17 – 2020-04-18 (×3): 30 mg/h via INTRAVENOUS
  Filled 2020-04-16 (×6): qty 200

## 2020-04-16 MED ORDER — LEVALBUTEROL HCL 0.63 MG/3ML IN NEBU: 0.6300 mg | INHALATION_SOLUTION | RESPIRATORY_TRACT | Status: DC | PRN

## 2020-04-16 MED ORDER — HYDROMORPHONE HCL 1 MG/ML IJ SOLN
0.5000 mg | INTRAMUSCULAR | Status: DC | PRN
Start: 2020-04-16 — End: 2020-04-21
  Administered 2020-04-16 – 2020-04-19 (×13): 0.5 mg via INTRAVENOUS
  Filled 2020-04-16 (×13): qty 0.5

## 2020-04-16 MED ORDER — SODIUM CHLORIDE 0.9% FLUSH
10.0000 mL | Freq: Two times a day (BID) | INTRAVENOUS | Status: DC
  Administered 2020-04-16 – 2020-04-17 (×2): 10 mL

## 2020-04-16 MED ORDER — HYDROMORPHONE HCL 1 MG/ML IJ SOLN
0.2500 mg | INTRAMUSCULAR | Status: DC | PRN
  Administered 2020-04-16: 0.25 mg via INTRAVENOUS
  Filled 2020-04-16: qty 0.5

## 2020-04-16 MED ORDER — NITROGLYCERIN 2 % TD OINT
1.0000 [in_us] | TOPICAL_OINTMENT | Freq: Three times a day (TID) | TRANSDERMAL | Status: DC
  Administered 2020-04-16 – 2020-04-17 (×3): 1 [in_us] via TOPICAL
  Filled 2020-04-16 (×3): qty 30

## 2020-04-16 MED ORDER — FUROSEMIDE 10 MG/ML IJ SOLN
40.0000 mg | Freq: Once | INTRAMUSCULAR | Status: AC
  Administered 2020-04-16: 40 mg via INTRAVENOUS
  Filled 2020-04-16: qty 4

## 2020-04-16 NOTE — Progress Notes (Signed)
Progress Note  Patient Name: Jamie Mueller Date of Encounter: 04/16/2020  [facility] Cardiologist: Rollene Rotunda, MD   Subjective   Yesterday morning she became quite hypotensive after the titration up of amiodarone overnight.  Blood pressures went down to the 60s and 70s, acute creatinine continued to increase.  Was placed on Levophed drip along with milrinone at reduced dose. With worsening renal function, we actually felt as though she may need some IV fluids so she was given IV fluids during the day, despite that she continues to have no urine output.  Once Levophed was initiated her blood pressures perked up and she neurologically improved dramatically.  Unfortunately late afternoon, early evening, her atrial fibrillation went back and rapid rate instead of being in the 90s up into the 140s.  Therefore amiodarone was restarted at 60 mg/h. She was also started on Precedex infusion at low-dose for overnight sedation to avoid sundowning events.  PCCM was consulted to assist with this.  Appreciate their assistance.  This morning she is much less responsive.  Blood pressures are in the 90s to 100 range, but she is now not as responsive despite having stable blood pressures, is simply laying in bed saying help me Jesus.  Has not had much in the way of any food or p.o. intake at all.  Yesterday was coughing after initially drinking water.  Swallow evaluation ordered, however I do not see the results.  Will adjust diet accordingly.  Inpatient Medications    Scheduled Meds: . aspirin EC  81 mg Oral Daily  . atorvastatin  40 mg Oral Daily  . Chlorhexidine Gluconate Cloth  6 each Topical Daily  . furosemide  40 mg Intravenous Once  . insulin aspart  0-9 Units Subcutaneous TID WC  . pantoprazole (PROTONIX) IV  40 mg Intravenous Q24H  . potassium chloride  10 mEq Oral Daily  . sodium chloride flush  10-40 mL Intracatheter Q12H  . sodium chloride flush  10-40 mL Intracatheter Q12H   . ticagrelor  90 mg Oral BID   Continuous Infusions: . amiodarone 30 mg/hr (04/16/20 1237)  . dexmedetomidine (PRECEDEX) IV infusion 0.1 mcg/kg/hr (04/16/20 1025)  . heparin 750 Units/hr (04/16/20 1125)  . milrinone 0.125 mcg/kg/min (04/16/20 0800)  . norepinephrine (LEVOPHED) Adult infusion 7 mcg/min (04/16/20 1234)   PRN Meds: acetaminophen (TYLENOL) oral liquid 160 mg/5 mL, HYDROmorphone (DILAUDID) injection, levalbuterol, nitroGLYCERIN, Resource ThickenUp Clear, sodium chloride flush   Vital Signs    Vitals:   04/16/20 1000 04/16/20 1100 04/16/20 1133 04/16/20 1200  BP:  131/69  (!) 87/61  Pulse: (!) 129 (!) 135  (!) 122  Resp: (!) 35 (!) 35  (!) 45  Temp:   97.7 F (36.5 C)   TempSrc:   Oral   SpO2: 97% 97%  100%  Weight:      Height:        Intake/Output Summary (Last 24 hours) at 04/16/2020 1329 Last data filed at 04/16/2020 0800 Gross per 24 hour  Intake 1397.54 ml  Output --  Net 1397.54 ml   Last 3 Weights 04/14/2020 04/13/2020 03/18/2020  Weight (lbs) 126 lb 12.2 oz 126 lb 8.7 oz 130 lb  Weight (kg) 57.5 kg 57.4 kg 58.968 kg      Telemetry    A. fib/flutter with rates in the 110s-130s  with occasional rarely conducted beats..- Personally Reviewed  ECG    No new EKG- Personally Reviewed  Physical Exam   General appearance: delirious, mild distress,  slowed mentation and Notably less responsive.  Making groaning movements and saying help me Jesus. Neck: no carotid bruit and JVP roughly 7-8 cmH2O, but difficult to assess. Lungs: normal percussion bilaterally and Again difficult to assess if he has mild coarse breath sounds throughout.  Difficult this test, because she would not stop talking Heart: irregularly irregular rhythm and Now tachycardic.  NoMR/G. Abdomen: Soft/NT/ND says NABS. Extremities: No C/C/C.  Frail Pulses:Diminished pedal and radial pulses. Neurologic: Mental status: alertness: lethargic, orientation: person/place -but only when talking to  her son  Labs    High Sensitivity Troponin:   Recent Labs  Lab 03/18/2020 2217 19-Nov-2020 0238 19-Nov-2020 0602  TROPONINIHS >135,540* >27,000* >27,000*      Chemistry Recent Labs  Lab 03/22/2020 2134 19-Nov-2020 0238 04/14/20 0051 04/15/20 0052 04/16/20 0158  NA 138   < > 137 137 133*  K 4.4   < > 3.3* 4.3 4.6  CL 104   < > 104 101 101  CO2 25   < > 21* 17* 17*  GLUCOSE 239*   < > 166* 159* 234*  BUN 23   < > 34* 49* 66*  CREATININE 1.88*   < > 2.56* 3.84* 4.87*  CALCIUM 8.6*   < > 8.1* 8.3* 7.7*  PROT 6.9  --   --  5.8* 5.4*  ALBUMIN 3.3*  --   --  2.6* 2.5*  AST 467*  --   --  255* 126*  ALT 55*  --   --  109* 84*  ALKPHOS 75  --   --  99 100  BILITOT 0.7  --   --  1.6* 1.9*  GFRNONAA 26*   < > 18* 11* 8*  ANIONGAP 9   < > 12 19* 15   < > = values in this interval not displayed.     Hematology Recent Labs  Lab 04/14/20 0051 04/15/20 0052 04/16/20 0158  WBC 11.8* 13.3* 13.1*  RBC 3.78* 3.54* 3.39*  HGB 10.3* 9.6* 9.2*  HCT 31.1* 29.5* 26.7*  MCV 82.3 83.3 78.8*  MCH 27.2 27.1 27.1  MCHC 33.1 32.5 34.5  RDW 14.2 14.6 14.2  PLT 218 229 221    BNPNo results for input(s): BNP, PROBNP in the last 168 hours.   DDimer No results for input(s): DDIMER in the last 168 hours.   Radiology    DG CHEST PORT 1 VIEW  Result Date: 04/16/2020 CLINICAL DATA:  [age] year old female status post PICC line placement. EXAM: PORTABLE CHEST 1 VIEW COMPARISON:  0349 hours today and earlier. FINDINGS: Portable AP semi upright view at 1050 hours. Left upper extremity PICC line placed, tip at the cavoatrial junction level. Continued mild rotation to the left. Mildly larger lung volumes and decreased pulmonary vascularity. Bibasilar ventilation has also improved with decreased bilateral veiling opacity. No pneumothorax. Stable surgical clips at the thoracic inlet. No acute osseous abnormality identified. Negative visible bowel gas. IMPRESSION: 1. Left upper extremity PICC line placed, tip at the  cavoatrial junction level. 2. Improved lung volumes and ventilation since 0349 hours. Electronically Signed   By: Odessa FlemingH  Hall M.D.   On: 04/16/2020 11:07   DG CHEST PORT 1 VIEW  Result Date: 04/16/2020 CLINICAL DATA:  87 year old female with wheezing. EXAM: PORTABLE CHEST 1 VIEW COMPARISON:  Portable chest 04/15/2020 and earlier. FINDINGS: Portable AP semi upright view at 0349 hours. Mildly rotated to the left. Stable cardiomegaly and mediastinal contours. Visualized tracheal air column is within normal limits. Ongoing bibasilar pulmonary  opacity, more veiling on the right. No definite air bronchograms. Pulmonary vascularity appears improved from yesterday. Stable visualized osseous structures. Surgical clips at the thoracic inlet compatible with prior thyroidectomy. IMPRESSION: 1. Regressed pulmonary edema since yesterday. 2. Ongoing bibasilar veiling and confluent opacity favored to be pleural effusions with lower lobe collapse or consolidation. Electronically Signed   By: Odessa Fleming M.D.   On: 04/16/2020 04:08   DG CHEST PORT 1 VIEW  Result Date: 04/15/2020 CLINICAL DATA:  Dyspnea, myocardial infarction EXAM: PORTABLE CHEST 1 VIEW COMPARISON:  04/14/2020 FINDINGS: Pulmonary insufflation is stable. Small bilateral pleural effusions have developed. No pneumothorax. There is progressive perihilar pulmonary infiltrate most in keeping with trace pulmonary edema. Cardiac size is mildly enlarged. No acute bone abnormality. IMPRESSION: Progressive cardiogenic failure with increasing perihilar pulmonary edema and developing small bilateral pleural effusions. Stable cardiomegaly. Electronically Signed   By: Helyn Numbers MD   On: 04/15/2020 05:57   [place] EKG SITE RITE  Result Date: 04/15/2020 If Site Rite image not attached, placement could not be confirmed due to current cardiac rhythm.   Cardiac Studies    ECHO 20-Apr-2020: EF 20 to 25%-mid and distal anterior wall, anterior septum and apex akinetic,  anterolateral, inferior wall, posterior wall and mid inferoseptal segment basal anterior segment and basal inferoseptal segment hypokinetic.  (Suggestive of large LAD ischemia/infarction).  Moderate concentric LVH.  GR 3 DD (restrictive)with elevated LAP -> severely dilated left atrium.;.  Moderate reduced RV function.  Mildly increased thickness.  Moderately elevated PAP.  Mildly dilated right atrium.  Small pericardial effusion.  Mild to moderate MR.  Moderate TR.  Mild aortic valve calcification-sclerosis but no stenosis.  Patient Profile     87 y.o. female with PMH notable for PAF, DM-2, HTN as well as early stages of dementia admitted with delayed presentation of anterolateral STEMI in the setting of A. fib RVR.  She subsequently developed~cardiogenic shock with AKI, requiring inotrope support.  Per Dr. Royann Shivers, echocardiogram suggests potential cardiac amyloid..  Assessment & Plan    Principal Problem:   Acute ST elevation myocardial infarction (STEMI) of anterolateral wall (HCC) Active Problems:   PAF (paroxysmal atrial fibrillation) (HCC)   Atrial fibrillation with RVR (HCC)   Acute renal failure superimposed on stage 3a chronic kidney disease (HCC)   Cardiogenic shock (HCC)   Ischemic cardiomyopathy   Acute combined systolic and diastolic heart failure (HCC)   Type 2 diabetes mellitus with other circulatory complications (HCC)   Essential hypertension   Hyperlipidemia associated with type 2 diabetes mellitus (HCC)   Hypomagnesemia   1.  Acute anterolateral STEMI: Echo findings consistent with LAD stenosis related STEMI.  Delayed presentation => now complicated by ischemic cardiomyopathy.  -> Appropriately, plan was for medical management only.  Did not was not taken to the Cath Lab.  LVEF 20 to 25%.  Concerning persistent ST elevation suggesting possible apical aneurysm.  At risk for mechanical and ar concern for possible LV apical aneurysm.  So far no mechanical  complications.  Continuing heparin more frequency of A. fib now.    on aspirin Brilinta.  However, with her worsening renal function we will continue IV heparin for A. fib.  Attempted beta-blocker, but became hypotensive.  Now on Levophed and milrinone.  40 mg atorvastatin  2. Cardiogenic shock/ischemic cardiomyopathy/acute combined systolic and diastolic heart failure: Initial CoOx was 45%. -Now more consistent with shock-blood pressures in the 60s and 70s.  PICC line placed today.  CoOx63 on milrinone and Levophed.  Unfortunately, renal function is not improving.  Creatinine higher today.  Plan for today will be gentle hydration throughout the course of the day with a dose of IV Lasix this afternoon after fluid challenge.    Unfortunately, if she does not have any signs of renal recovery, would probably need to start moving toward palliative care.   2. AFib w RVR/now in AFlutter: Difficult rate control;  Unfortunate, rate went up yesterday evening with Levophed and agitation.  Placed back on amiodarone infusion.  Reduce amiodarone infusion rate down to 30 mg/h.  Do not dissipate switching to p.o. for now.  3. Acute on chronic renal insuff:    Creatinine seems to be progressively worse.  Now 4.7 in the setting of oliguric/borderline anuric renal failure.  Suspect that this is probably related to combination of shock from the MI as well as cardiomyopathy/heart failure with low cardiac output.  We will gently hydrate today and then rechallenging with Lasix.  4. HTN: Borderline cardiogenic shock on milrinone and Levophed  5. DM-2: , Had been well controlled.  On sliding scale insulin, but notes no p.o., therefore sugars are well controlled.  6. HypoMg (hypokalemia):  Replete as needed  7.  No more fever.  8.  Dementia/delirium => notably more delirious overnight.  PCCM consulted to assist with sedation to avoid patient injury.  Dispo:  CODE STATUS decision yesterday-now  DNR/DNI  With progressively worsening renal function, I have consulted Palliative Care consult team to assist with difficult discussions.  I suspect that if her renal function is not able to get any better, that we may need to move toward comfort care measures.  Pending return of renal function, comfort measures would likely be the best option    CRITICAL CARE Performed by: Bryan Lemma   Total critical care time: minutes  Critical care time was exclusive of separately billable procedures and treating other patients.  Critical care was necessary to treat or prevent imminent or life-threatening deterioration.  Critical care was time spent personally by me on the following activities: development of treatment plan with patient and/or surrogate as well as nursing, discussions with consultants and pharmacist team, evaluation of patient's response to treatment, examination of patient, obtaining history from patient or surrogate, ordering and performing treatments and interventions, ordering and review of laboratory studies, ordering and review of radiographic studies, pulse oximetry and re-evaluation of patient's condition.   For questions or updates, please contact [facility] Please consult [email or web address] for contact info under        Signed, Bryan Lemma, MD  04/16/2020, 1:29 PM

## 2020-04-16 NOTE — Progress Notes (Signed)
Peripherally Inserted Central Catheter Placement  The IV Nurse has discussed with the patient and/or persons authorized to consent for the patient, the purpose of this procedure and the potential benefits and risks involved with this procedure.  The benefits include less needle sticks, lab draws from the catheter, and the patient may be discharged home with the catheter. Risks include, but not limited to, infection, bleeding, blood clot (thrombus formation), and puncture of an artery; nerve damage and irregular heartbeat and possibility to perform a PICC exchange if needed/ordered by physician.  Alternatives to this procedure were also discussed.  Bard Power PICC patient education guide, fact sheet on infection prevention and patient information card has been provided to patient /or left at bedside. Son at bedside. Previous consent in chart.   PICC Placement Documentation  PICC Triple Lumen 04/16/20 PICC Left Brachial 0 cm 40 cm (Active)  Indication for Insertion or Continuance of Line Administration of hyperosmolar/irritating solutions (i.e. TPN, Vancomycin, etc.) 04/16/20 1043  Exposed Catheter (cm) 0 cm 04/16/20 1043  Site Assessment Clean;Dry;Intact 04/16/20 1043  Lumen #1 Status Flushed;Saline locked;Blood return noted 04/16/20 1043  Lumen #2 Status Flushed;Saline locked;Blood return noted 04/16/20 1043  Lumen #3 Status Flushed;Saline locked;Blood return noted 04/16/20 1043  Dressing Type Transparent;Securing device 04/16/20 1043  Dressing Status Clean;Dry;Intact 04/16/20 1043  Antimicrobial disc in place? Yes 04/16/20 1043  Safety Lock Not Applicable 04/16/20 1043  Line Care Connections checked and tightened 04/16/20 1043  Dressing Intervention New dressing 04/16/20 1043  Dressing Change Due 04/23/20 04/16/20 1043       [name] 04/16/2020, 10:44 AM

## 2020-04-16 NOTE — Progress Notes (Signed)
  Speech Language Pathology Treatment: Dysphagia  Patient Details Name: Jamie Mueller MRN: [id number] DOB: 07-Apr-1930 Today's Date: 04/16/2020 Time: 7106-2694 SLP Time Calculation (min) (ACUTE ONLY): 13 min  Assessment / Plan / Recommendation Clinical Impression  Pt still has immediate coughing today with trials of thin liquids, also reporting odynophagia. She has no further s/s of aspiration or reports of pain with nectar thick liquids or solids, but she does have quite prolonged mastication with solids. Pt's RR was also more elevated today, requiring cues from SLP to slow her rate due to risk of difficulty coordinating breathing/swallowing as she becomes more tachypneic. Would continue with Dys 2 diet and nectar thick liquids for now. Pt's family, who arrived at the end of the session, are also in agreement.    HPI HPI: Pt is an 87 yo female presenting with STEMI in the setting of afib with RVR. She subsequently developed cardiogenic shock with AKI. Swallowing evaluation was ordered due to reports of coughing with thin liquids and purees at breakfast on 3/29. PMH includes: PAF, DMII, HTN, memory loss      SLP Plan  Continue with current plan of care       Recommendations  Diet recommendations: Dysphagia 2 (fine chop);Nectar-thick liquid Liquids provided via: Cup;Straw Medication Administration: Crushed with puree Supervision: Staff to assist with self feeding;Full supervision/cueing for compensatory strategies Compensations: Minimize environmental distractions;Slow rate;Small sips/bites Postural Changes and/or Swallow Maneuvers: Seated upright 90 degrees;Upright 30-60 min after meal                Oral Care Recommendations: Oral care BID Follow up Recommendations: Other (comment) (tba) SLP Visit Diagnosis: Dysphagia, unspecified (R13.10) Plan: Continue with current plan of care       GO                Mahala Menghini., M.A. CCC-SLP Acute Rehabilitation Services Pager  [phone] Office [phone]  04/16/2020, 1:44 PM

## 2020-04-16 NOTE — Progress Notes (Signed)
NAME:  Jamie Mueller, MRN:  [id number], DOB:  05-Mar-1930, LOS: 4 ADMISSION DATE:  2020-04-14, CONSULTATION DATE:  04/15/2020 REFERRING MD:  Dr. Herbie Baltimore, CHIEF COMPLAINT:  Agitation  History of Present Illness:   87 year old female with prior history of hypertension, diabetes, hyperlipidemia, A. Fib (not on AC), and dementia recently ended with abdominal pain admitted with A. fib with RVR, inferolateral STEMI, and AKI.  Cardiology elected for medical management.  Hospitalization complicated by progressive anuric AKI and cardiogenic shock.  Nephrology consulted, however not a candidate for dialysis.  She has been supported on milrinone and norepinephrine.  She has had ongoing confusion to the point of pulling out her PICC on 3/28.  On 3/29 she had worsening hypotension now and Afib with RVR with increasing confusion, remains on NE and amiodarone gtts.  PCCM consulted to assist with further medical management and agitation.   Pertinent  Medical History  Hypertension, diabetes, hyperlipidemia, A. Fib (not on [facility]), dementia, CAD  Significant Hospital Events: Including procedures, antibiotic start and stop dates in addition to other pertinent events   3/25 admitted cardiology 3/26 Echo > EF 20-25%, G2DD.  Interim History / Subjective:  Remains on milrinone 0.125 and NE 9.  MAP 80. Eyes open but not following commands.   Objective   Blood pressure 119/69, pulse (!) 135, temperature (!) 97.3 F (36.3 C), temperature source Axillary, resp. rate (!) 42, height 5\' 2"  (1.575 m), weight 57.5 kg, SpO2 97 %.        Intake/Output Summary (Last 24 hours) at 04/16/2020 0935 Last data filed at 04/16/2020 0800 Gross per 24 hour  Intake 1601.81 ml  Output 2 ml  Net 1599.81 ml   Filed Weights   14-Apr-2020 2035 04/13/20 0500 04/14/20 0500  Weight: 59 kg 57.4 kg 57.5 kg    Examination: General: Elderly female, resting in bed, in NAD. Neuro: Eyes open but not following commands. HEENT: [place]/AT.  Sclerae anicteric. EOMI. Cardiovascular: Tachy, 04/16/20, no M/R/G.  Lungs: Respirations even and unlabored.  CTA bilaterally, No W/R/R. Abdomen: BS x 4, soft, NT/ND.  Musculoskeletal: No gross deformities, no edema.  Skin: Intact, warm, no rashes.   Resolved Hospital Problem list    Assessment & Plan:   Cardiogenic shock - echo 3/26 with EF 20-25%, G2DD> Inferolateral STEMI- medical management only ICM - Cards following, medical management only. - Continue milrinone and NE as needed for goal MAP > 65 (have asked RN to wean NE down given MAP 80 and AFRVR). - Continue heparin, ASA, Brilinta, Lipitor. - New PICC pending, once placed then continue routine co-ox checks. - trend LFTs  PAF. - Per cardiology, continue Heparin and Amiodarone.  AKI, now anuric (<27ml during I/O cath) with no response to lasix or fluid challenge - Nephrology seeing, not a HD candidate, rising sCr - Supportive care, if no response would recommend transition to comfort. - trend renal indices and UOP  DM - SSI.   Agitated delirium with underlying dementia.  - low dose precedex and wean to off (currently only on 0.1). - delirium precautions. - no other signs of infection/ fever to be contributing at this time, continue to clinically monitor at this time. - Push normal sleep / wake cycle.  Poor PO intake/ at risk for malnutrition. - evaluated by SLP 3/29, placed on dysphagia 2 diet. - aspiration precautions.   Not much else to offer from our standpoint here.  Continue medical management per cards.  Wean precedex to off (currently on 0.1  mcg/kg/hr).   PCCM will sign off.  Please do not hesitate to call us back if we can be of any further assistance.   Best practice (right click and "Reselect all SmartList Selections" daily)  Diet:  Oral Pain/Anxiety/Delirium protocol (if indicated): Yes (RASS goal 0) VAP protocol (if indicated): Not indicated DVT prophylaxis: Systemic AC GI prophylaxis: PPI Glucose  control:  SSI Yes Central venous access:  N/A, PICC pending Arterial line:  N/A Foley:  N/A Mobility:  bed rest  PT consulted: N/A Last date of multidisciplinary goals of care discussion [3/29 per primary] Code Status:  DNR as of 3/29 Disposition: Cardiac ICU  CC time: 30 min.   Rutherford Guys, PA - [facility] For pager details, please see AMION If no response to pager, please call [phone] until 7:00 PM After 7:00 PM, please call Elink at [phone] 04/16/2020, 9:45 AM

## 2020-04-16 NOTE — Progress Notes (Signed)
ANTICOAGULATION CONSULT NOTE  Pharmacy Consult for Heparin Indication: chest pain/ACS; also w/ afib  No Known Allergies  Patient Measurements: Height: 5' 2"  (157.5 cm) Weight: 57.5 kg (126 lb 12.2 oz) IBW/kg (Calculated) : 50.1     Vital Signs: Temp: 97.3 F (36.3 C) (03/30 0828) Temp Source: Axillary (03/30 0828) BP: 119/69 (03/30 0800) Pulse Rate: 135 (03/30 0800)  Labs: Recent Labs    04/14/20 0051 04/14/20 1003 04/15/20 0052 04/15/20 1642 04/16/20 0158  HGB 10.3*  --  9.6*  --  9.2*  HCT 31.1*  --  29.5*  --  26.7*  PLT 218  --  229  --  221  HEPARINUNFRC 0.15*   < > 0.71* 0.83* 0.47  CREATININE 2.56*  --  3.84*  --  4.87*   < > = values in this interval not displayed.    Estimated Creatinine Clearance: 6.4 mL/min (A) (by C-G formula based on SCr of 4.87 mg/dL (H)).   Medical History: Past Medical History:  Diagnosis Date  . Diabetes mellitus without complication ([place])   . Hypercholesteremia   . Hypertension    benign  . Memory loss   . PAF (paroxysmal atrial fibrillation) ([place])   . Vitamin B 12 deficiency     Medications:  No current facility-administered medications on file prior to encounter.   Current Outpatient Medications on File Prior to Encounter  Medication Sig Dispense Refill  . atenolol (TENORMIN) 50 MG tablet Take 50 mg by mouth daily.  4  . chlorthalidone (HYGROTON) 25 MG tablet Take 1/2 tablet daily. (Patient taking differently: Take 12.5 mg by mouth daily as needed (leg swelling).) 45 tablet 3  . enalapril (VASOTEC) 10 MG tablet Take 1 tablet (10 mg total) by mouth once for 1 dose. (Patient taking differently: Take 10 mg by mouth daily.) 90 tablet 3  . gabapentin (NEURONTIN) 300 MG capsule Take 300 mg by mouth 3 (three) times daily.    . metFORMIN (GLUCOPHAGE) 500 MG tablet Take 1,000 mg by mouth 2 (two) times daily with a meal.  3  . pravastatin (PRAVACHOL) 40 MG tablet Take 40 mg by mouth daily.  4  . Blood Glucose Calibration  (ONETOUCH VERIO) SOLN     . Blood Glucose Monitoring Suppl ([place] FLEX SYSTEM) w/Device KIT     . ONETOUCH VERIO test strip 1 each daily.       Assessment: 87 y.o. female admitted with STEMI for heparin.  Also has atrial fibrillation, not on anticoagulation at admission although reports Eliquis in the past.  Heparin level is therapeutic at 0.47 on heparin drip rate 750 uts/hr. Hgb 9.2, platelets stable. No s/sx bleeding reported.  Per Cardiology, no plan for cath, now > 72 hours on heparin s/p ACS, plan to continue heparin for afib given renal function worsening.  Goal of Therapy:  Heparin level 0.3-0.7 units/ml Monitor platelets by anticoagulation protocol: Yes   Plan:  Continue IV heparin 750 units/hr Daily heparin level and CBC F/u plans for long term anticoagulation for afib  Fara Olden, PharmD PGY-1 Pharmacy Resident 04/16/2020 10:50 AM Please see AMION for all pharmacy numbers

## 2020-04-16 NOTE — Progress Notes (Addendum)
eLink Physician-Brief Progress Note Patient Name: Jamie Mueller DOB: 1930/05/26 MRN: [id number]   Date of Service  04/16/2020  HPI/Events of Note  Wheezing - Nursing request for bronchodilator neb. HR = 140's. AFIB with RVR being managed by cardiology. CXR yesterday morning c/w heart failure.   eICU Interventions  Plan: 1. Xopenex 0.63 mg via neb Q 3 hours PRN wheezing or SOB.  2. Portable CXR STAT. 3. Bedside nurse requested to call cardiology for AFIB with RVR and possible heart failure management.      Intervention Category Major Interventions: Other:  Lenell Antu 04/16/2020, 3:36 AM

## 2020-04-16 NOTE — Progress Notes (Signed)
Notified Elink patient was experiencing increased work of breathing and wheezing.  Awaiting call back from Dr. Arsenio Loader.  Call back, new orders received.  Paged Cardiology, Dr. Okey Dupre to address AFIB.  Received call back at 0413.  No bolus given,  Amio gtt was restarted as ordered.  No other changes in current gtts at this time.   Heart rate remains in AFIB, with rate fluctuations 100" to 130's.  Patient has slept tonight with addition of Precidex.  Will wean this down by am.  Patient was very agitated, and restless at the beginning of the shift requiring titration of precidex.  Pt will not keep purwick in place.  Changed bed and noted small amount of stool and possible urine.  Bladder scan still registering 0-10 mls. Creatinine continues to climb. MDs aware.   Monitoring continues.

## 2020-04-17 DIAGNOSIS — I4891 Unspecified atrial fibrillation: Secondary | ICD-10-CM | POA: Diagnosis not present

## 2020-04-17 DIAGNOSIS — R451 Restlessness and agitation: Secondary | ICD-10-CM

## 2020-04-17 DIAGNOSIS — I5041 Acute combined systolic (congestive) and diastolic (congestive) heart failure: Secondary | ICD-10-CM | POA: Diagnosis not present

## 2020-04-17 DIAGNOSIS — Z66 Do not resuscitate: Secondary | ICD-10-CM

## 2020-04-17 DIAGNOSIS — Z7189 Other specified counseling: Secondary | ICD-10-CM

## 2020-04-17 DIAGNOSIS — I2109 ST elevation (STEMI) myocardial infarction involving other coronary artery of anterior wall: Secondary | ICD-10-CM | POA: Diagnosis not present

## 2020-04-17 DIAGNOSIS — N170 Acute kidney failure with tubular necrosis: Secondary | ICD-10-CM | POA: Diagnosis not present

## 2020-04-17 DIAGNOSIS — R0600 Dyspnea, unspecified: Secondary | ICD-10-CM

## 2020-04-17 LAB — GLUCOSE, CAPILLARY
Glucose-Capillary: 247 mg/dL — ABNORMAL HIGH (ref 70–99)
Glucose-Capillary: 113 mg/dL — ABNORMAL HIGH (ref 70–99)

## 2020-04-17 LAB — BASIC METABOLIC PANEL
Anion gap: 16 — ABNORMAL HIGH (ref 5–15)
BUN: 65 mg/dL — ABNORMAL HIGH (ref 8–23)
CO2: 16 mmol/L — ABNORMAL LOW (ref 22–32)
Calcium: 7 mg/dL — ABNORMAL LOW (ref 8.9–10.3)
Chloride: 92 mmol/L — ABNORMAL LOW (ref 98–111)
Creatinine, Ser: 5.24 mg/dL — ABNORMAL HIGH (ref 0.44–1.00)
GFR, Estimated: 7 mL/min — ABNORMAL LOW (ref >60)
Glucose, Bld: 490 mg/dL — ABNORMAL HIGH (ref 70–99)
Potassium: 4.2 mmol/L (ref 3.5–5.1)
Sodium: 124 mmol/L — ABNORMAL LOW (ref 135–145)

## 2020-04-17 LAB — CBC
HCT: 24.4 % — ABNORMAL LOW (ref 36.0–46.0)
Hemoglobin: 8.4 g/dL — ABNORMAL LOW (ref 12.0–15.0)
MCH: 27.4 pg (ref 26.0–34.0)
MCHC: 34.4 g/dL (ref 30.0–36.0)
MCV: 79.5 fL — ABNORMAL LOW (ref 80.0–100.0)
Platelets: 171 10*3/uL (ref 150–400)
RBC: 3.07 MIL/uL — ABNORMAL LOW (ref 3.87–5.11)
RDW: 14.1 % (ref 11.5–15.5)
WBC: 9.4 10*3/uL (ref 4.0–10.5)
nRBC: 0.6 % — ABNORMAL HIGH (ref 0.0–0.2)

## 2020-04-17 LAB — HEPARIN LEVEL (UNFRACTIONATED): Heparin Unfractionated: 0.26 IU/mL — ABNORMAL LOW (ref 0.30–0.70)

## 2020-04-17 LAB — COOXEMETRY PANEL
Carboxyhemoglobin: 1 % (ref 0.5–1.5)
Methemoglobin: 0.8 % (ref 0.0–1.5)
O2 Saturation: 73.9 %
Total hemoglobin: 8.4 g/dL — ABNORMAL LOW (ref 12.0–16.0)

## 2020-04-17 MED ORDER — BIOTENE DRY MOUTH MT LIQD: 15.0000 mL | OROMUCOSAL | Status: DC | PRN

## 2020-04-17 MED ORDER — POLYVINYL ALCOHOL 1.4 % OP SOLN
1.0000 [drp] | Freq: Four times a day (QID) | OPHTHALMIC | Status: DC | PRN
Start: 2020-04-17 — End: 2020-04-21
  Filled 2020-04-17: qty 15

## 2020-04-17 MED ORDER — ONDANSETRON HCL 4 MG/2ML IJ SOLN: 4.0000 mg | Freq: Four times a day (QID) | INTRAMUSCULAR | Status: DC | PRN

## 2020-04-17 MED ORDER — HALOPERIDOL LACTATE 5 MG/ML IJ SOLN: 0.5000 mg | INTRAMUSCULAR | Status: DC | PRN

## 2020-04-17 MED ORDER — GLYCOPYRROLATE 0.2 MG/ML IJ SOLN: 0.2000 mg | INTRAMUSCULAR | Status: DC | PRN

## 2020-04-17 MED ORDER — GLYCOPYRROLATE 1 MG PO TABS
1.0000 mg | ORAL_TABLET | ORAL | Status: DC | PRN
  Filled 2020-04-17: qty 1

## 2020-04-17 MED ORDER — LORAZEPAM 2 MG/ML IJ SOLN
1.0000 mg | INTRAMUSCULAR | Status: DC | PRN
  Administered 2020-04-19: 1 mg via INTRAVENOUS
  Filled 2020-04-17: qty 1

## 2020-04-17 MED ORDER — HALOPERIDOL 0.5 MG PO TABS
0.5000 mg | ORAL_TABLET | ORAL | Status: DC | PRN
  Filled 2020-04-17: qty 1

## 2020-04-17 MED ORDER — NOREPINEPHRINE 16 MG/250ML-% IV SOLN
0.0000 ug/min | INTRAVENOUS | Status: DC
  Administered 2020-04-17: 16 ug/min via INTRAVENOUS
  Administered 2020-04-17: 12 ug/min via INTRAVENOUS
  Administered 2020-04-18: 25 ug/min via INTRAVENOUS
  Administered 2020-04-19 (×2): 26 ug/min via INTRAVENOUS
  Administered 2020-04-20: 28 ug/min via INTRAVENOUS
  Filled 2020-04-17 (×9): qty 250

## 2020-04-17 MED ORDER — LORAZEPAM 2 MG/ML PO CONC: 1.0000 mg | ORAL | Status: DC | PRN

## 2020-04-17 MED ORDER — LORAZEPAM 1 MG PO TABS: 1.0000 mg | ORAL_TABLET | ORAL | Status: DC | PRN

## 2020-04-17 MED ORDER — ONDANSETRON 4 MG PO TBDP
4.0000 mg | ORAL_TABLET | Freq: Four times a day (QID) | ORAL | Status: DC | PRN
  Filled 2020-04-17: qty 1

## 2020-04-17 MED ORDER — HALOPERIDOL LACTATE 2 MG/ML PO CONC
0.5000 mg | ORAL | Status: DC | PRN
  Filled 2020-04-17: qty 0.3

## 2020-04-17 NOTE — Progress Notes (Signed)
Progress Note  Patient Name: Jamie Jamie Mueller Date of Encounter: 04/17/2020  [facility] Cardiologist: Rollene RotundaJames Hochrein, MD   Subjective   Much less alert today.  Still tachycardic in the 130s to 140s.  Requiring Levophed.  Interestingly, coox is in the 70s.  Despite this, still not having any urine output.  Creatinine worse. Required sedation overnight because of agitation.  Plan is palliative care discussion today.  Recommendation is to proceed toward palliative care based on no sign of recovery of renal function.  Inpatient Medications    Scheduled Meds: . aspirin EC  81 mg Oral Daily  . atorvastatin  40 mg Oral Daily  . Chlorhexidine Gluconate Cloth  6 each Topical Daily  . insulin aspart  0-9 Units Subcutaneous TID WC  . nitroGLYCERIN  1 inch Topical Q8H  . pantoprazole (PROTONIX) IV  40 mg Intravenous Q24H  . sodium chloride flush  10-40 mL Intracatheter Q12H  . sodium chloride flush  10-40 mL Intracatheter Q12H  . ticagrelor  90 mg Oral BID   Continuous Infusions: . amiodarone 30 mg/hr (04/17/20 0400)  . dexmedetomidine (PRECEDEX) IV infusion Stopped (04/16/20 1056)  . heparin 800 Units/hr (04/17/20 0513)  . milrinone 0.125 mcg/kg/min (04/17/20 0400)  . norepinephrine (LEVOPHED) Adult infusion 16 mcg/min (04/17/20 0956)   PRN Meds: acetaminophen (TYLENOL) oral liquid 160 mg/5 mL, HYDROmorphone (DILAUDID) injection, levalbuterol, nitroGLYCERIN, Resource ThickenUp Clear, sodium chloride flush   Vital Signs    Vitals:   04/17/20 0731 04/17/20 0900 04/17/20 0930 04/17/20 1000  BP:  (!) 82/59 (!) 72/45 (!) 101/56  Pulse:  (!) 142  (!) 141  Resp:  17  16  Temp: 98.3 F (36.8 C)     TempSrc: Oral     SpO2:  96% 95% 95%  Weight:      Height:        Intake/Output Summary (Last 24 hours) at 04/17/2020 1030 Last data filed at 04/17/2020 0513 Gross per 24 hour  Intake 1821.41 ml  Output --  Net 1821.41 ml   Last 3 Weights 04/17/2020 04/14/2020 04/13/2020   Weight (lbs) 134 lb 11.2 oz 126 lb 12.2 oz 126 lb 8.7 oz  Weight (kg) 61.1 kg 57.5 kg 57.4 kg      Telemetry    A. fib/flutter with rates in the 110s-130s  with occasional rarely conducted beats..- Personally Reviewed  ECG    No new EKG- Personally Reviewed  Physical Exam   General appearance: Minimally responsive.  Open eyes to touch, but not responding to commands. Neck: No JVD or bruit. Lungs: Anterior lung fields clear.  Borderline tachypneic Heart: i tachycardic with irregular regular rhythm.  Unable to assess abscess R/due to tachycardia and moaning. Abdomen: Still somewhat soft, NT/ND says NABS.  No extent. Extremities: No C/C/E. Pulses diffusely diminished pulses. Neurologic: Borderline obtunded.  Labs    High Sensitivity Troponin:   Recent Labs  Lab 11/25/20 2217 04/07/2020 0238 04/09/2020 0602  TROPONINIHS >135,540* >27,000* >27,000*      Chemistry Recent Labs  Lab 11/25/20 2134 03/29/2020 0238 04/15/20 0052 04/16/20 0158 04/17/20 0333  NA 138   < > 137 133* 124*  K 4.4   < > 4.3 4.6 4.2  CL 104   < > 101 101 92*  CO2 25   < > 17* 17* 16*  GLUCOSE 239*   < > 159* 234* 490*  BUN 23   < > 49* 66* 65*  CREATININE 1.88*   < > 3.84* 4.87*  5.24*  CALCIUM 8.6*   < > 8.3* 7.7* 7.0*  PROT 6.9  --  5.8* 5.4*  --   ALBUMIN 3.3*  --  2.6* 2.5*  --   AST 467*  --  255* 126*  --   ALT 55*  --  109* 84*  --   ALKPHOS 75  --  99 100  --   BILITOT 0.7  --  1.6* 1.9*  --   GFRNONAA 26*   < > 11* 8* 7*  ANIONGAP 9   < > 19* 15 16*   < > = values in this interval not displayed.     Hematology Recent Labs  Lab 04/15/20 0052 04/16/20 0158 04/17/20 0333  WBC 13.3* 13.1* 9.4  RBC 3.54* 3.39* 3.07*  HGB 9.6* 9.2* 8.4*  HCT 29.5* 26.7* 24.4*  MCV 83.3 78.8* 79.5*  MCH 27.1 27.1 27.4  MCHC 32.5 34.5 34.4  RDW 14.6 14.2 14.1  PLT 229 221 171    BNPNo results for input(s): BNP, PROBNP in the last 168 hours.   DDimer No results for input(s): DDIMER in the last  168 hours.   Radiology    DG CHEST PORT 1 VIEW  Result Date: 04/16/2020 CLINICAL DATA:  87 year old female status post PICC line placement. EXAM: PORTABLE CHEST 1 VIEW COMPARISON:  0349 hours today and earlier. FINDINGS: Portable AP semi upright view at 1050 hours. Left upper extremity PICC line placed, tip at the cavoatrial junction level. Continued mild rotation to the left. Mildly larger lung volumes and decreased pulmonary vascularity. Bibasilar ventilation has also improved with decreased bilateral veiling opacity. No pneumothorax. Stable surgical clips at the thoracic inlet. No acute osseous abnormality identified. Negative visible bowel gas. IMPRESSION: 1. Left upper extremity PICC line placed, tip at the cavoatrial junction level. 2. Improved lung volumes and ventilation since 0349 hours. Electronically Signed   By: Odessa Fleming M.D.   On: 04/16/2020 11:07   DG CHEST PORT 1 VIEW  Result Date: 04/16/2020 CLINICAL DATA:  87 year old female with wheezing. EXAM: PORTABLE CHEST 1 VIEW COMPARISON:  Portable chest 04/15/2020 and earlier. FINDINGS: Portable AP semi upright view at 0349 hours. Mildly rotated to the left. Stable cardiomegaly and mediastinal contours. Visualized tracheal air column is within normal limits. Ongoing bibasilar pulmonary opacity, more veiling on the right. No definite air bronchograms. Pulmonary vascularity appears improved from yesterday. Stable visualized osseous structures. Surgical clips at the thoracic inlet compatible with prior thyroidectomy. IMPRESSION: 1. Regressed pulmonary edema since yesterday. 2. Ongoing bibasilar veiling and confluent opacity favored to be pleural effusions with lower lobe collapse or consolidation. Electronically Signed   By: Odessa Fleming M.D.   On: 04/16/2020 04:08   [place] EKG SITE RITE  Result Date: 04/15/2020 If Site Rite image not attached, placement could not be confirmed due to current cardiac rhythm.   Cardiac Studies    ECHO 03/20/2020: EF 20  to 25%-mid and distal anterior wall, anterior septum and apex akinetic, anterolateral, inferior wall, posterior wall and mid inferoseptal segment basal anterior segment and basal inferoseptal segment hypokinetic.  (Suggestive of large LAD ischemia/infarction).  Moderate concentric LVH.  GR 3 DD (restrictive)with elevated LAP -> severely dilated left atrium.;.  Moderate reduced RV function.  Mildly increased thickness.  Moderately elevated PAP.  Mildly dilated right atrium.  Small pericardial effusion.  Mild to moderate MR.  Moderate TR.  Mild aortic valve calcification-sclerosis but no stenosis.  Patient Profile     86 y.o. female with PMH  notable for PAF, DM-2, HTN as well as early stages of dementia admitted with delayed presentation of anterolateral STEMI in the setting of A. fib RVR.  She subsequently developed~cardiogenic shock with AKI, requiring inotrope support.  Per Dr. Royann Shivers, echocardiogram suggests potential cardiac amyloid..  Assessment & Plan    Principal Problem:   Acute ST elevation myocardial infarction (STEMI) of anterolateral wall (HCC) Active Problems:   PAF (paroxysmal atrial fibrillation) (HCC)   Atrial fibrillation with RVR (HCC)   Acute renal failure superimposed on stage 3a chronic kidney disease (HCC)   Cardiogenic shock (HCC)   Ischemic cardiomyopathy   Acute combined systolic and diastolic heart failure (HCC)   Type 2 diabetes mellitus with other circulatory complications (HCC)   Essential hypertension   Hyperlipidemia associated with type 2 diabetes mellitus (HCC)   Hypomagnesemia   1.  Acute anterolateral STEMI: Echo findings consistent with LAD stenosis related STEMI.  Delayed presentation => now complicated by ischemic cardiomyopathy.  -> Appropriately, plan was for medical management only.  Did not was not taken to the Cath Lab.  LVEF 20 to 25%.  Concerning persistent ST elevation suggesting possible apical aneurysm.  At risk for mechanical and ar concern  for possible LV apical aneurysm.  So far no mechanical complications.  Continuing heparin more frequency of A. fib now -> however if the decision is to move toward palliative care, would likely discontinue.  on aspirin and Brilinta for now.  On statin.  Unable to add beta-blocker because of hypotension.   2. Cardiogenic shock/ischemic cardiomyopathy/acute combined systolic and diastolic heart failure: Initial CoOx was 45%. -Now more consistent with shock-blood pressures in the 60s and 70s.  PICC line placed yesterday.  CoOx 73 on both milrinone-low-dose and Levophed.  Unfortunately, not able to titrate down Levophed.  Renal function still not improving.  Still no urine output.   Failed attempt at gentle hydration yesterday.  With no sign of meaningful renal recovery renal function, plans for palliative care discussion today.  Recommendation is to consider moving toward comfort care.  2. AFib w RVR/now in AFlutter: Difficult rate control;  Remains tachycardic despite being on amiodarone.  Unable to titrate further in a.m. we are because of hypotension.  3. Acute on chronic renal insuff:    Continued exacerbation of renal function.  At this point I think were at the point where she may not recover.  Recommending palliative care.  4. HTN: Currently in shock.  5. DM-2: , Sliding scale insulin.  6. HypoMg (hypokalemia):  Replete as necessary.  Now also hyponatremic.  No free water being given.  7.  No more fever.  8.  Dementia/delirium => now in question how much of this is dementia versus simply starting to show signs of anuric renal failure leading to uremic encephalopathy.  Dispo:  CODE STATUS decision yesterday-now DNR/DNI  Palliative care consult.  Can meet with family today.  Would anticipate likely moving toward comfort care.    CRITICAL CARE Performed by: Bryan Lemma   Total critical care time: 35 mean minutes  Critical care time was exclusive of separately  billable procedures and treating other patients.  Critical care was necessary to treat or prevent imminent or life-threatening deterioration.  Critical care was time spent personally by me on the following activities: development of treatment plan with patient and/or surrogate as well as nursing, discussions with consultants and pharmacist team, evaluation of patient's response to treatment, examination of patient, obtaining history from patient or surrogate, ordering and  performing treatments and interventions, ordering and review of laboratory studies, ordering and review of radiographic studies, pulse oximetry and re-evaluation of patient's condition.   For questions or updates, please contact [facility] Please consult [email or web address] for contact info under        Signed, Bryan Lemma, MD  04/17/2020, 10:30 AM

## 2020-04-17 NOTE — Progress Notes (Signed)
ANTICOAGULATION CONSULT NOTE - Follow Up Consult  Pharmacy Consult for heparin Indication: atrial fibrillation  Labs: Recent Labs    04/15/20 0052 04/15/20 1642 04/16/20 0158 04/17/20 0333  HGB 9.6*  --  9.2* 8.4*  HCT 29.5*  --  26.7* 24.4*  PLT 229  --  221 171  HEPARINUNFRC 0.71* 0.83* 0.47 0.26*  CREATININE 3.84*  --  4.87* 5.24*    Assessment: 87yo female subtherapeutic on heparin after one level at goal; no gtt issues or signs of bleeding per RN.  Goal of Therapy:  Heparin level 0.3-0.7 units/ml   Plan:  Will increase heparin gtt by ~1 unit/kg/hr to 800 units/hr and check level in 8 hours.    Vernard Gambles, PharmD, BCPS  04/17/2020,5:12 AM

## 2020-04-17 NOTE — Progress Notes (Signed)
This chaplain responded to PMT consult for EOL spiritual care. The chaplain introduced herself and offered spiritual care to the Pt. family at the bedside.    The chaplain understands family is taking turns visiting three at a time and the Pt. pastor will visit this afternoon. The chaplain introduced herself to the Pt. RN-PAL.  This chaplain is available for F/U spiritual care as needed.

## 2020-04-17 NOTE — Progress Notes (Signed)
Paged Pallative care to set up family meeting.  Daughter seeking information regarding comfort care.

## 2020-04-17 NOTE — Progress Notes (Signed)
NAME:  Jamie Mueller, MRN:  [id number], DOB:  30-Jan-1930, LOS: 5 ADMISSION DATE:  03/24/2020, CONSULTATION DATE:  04/15/2020 REFERRING MD:  Dr. Herbie Baltimore, CHIEF COMPLAINT:  Agitation  History of Present Illness:   87 year old female with prior history of hypertension, diabetes, hyperlipidemia, A. Fib (not on AC), and dementia recently ended with abdominal pain admitted with A. fib with RVR, inferolateral STEMI, and AKI.  Cardiology elected for medical management.  Hospitalization complicated by progressive anuric AKI and cardiogenic shock.  Nephrology consulted, however not a candidate for dialysis.  She has been supported on milrinone and norepinephrine.  She has had ongoing confusion to the point of pulling out her PICC on 3/28.  On 3/29 she had worsening hypotension now and Afib with RVR with increasing confusion, remains on NE and amiodarone gtts.  PCCM consulted to assist with further medical management and agitation.   Pertinent  Medical History  Hypertension, diabetes, hyperlipidemia, A. Fib (not on [employer] [facility] [facility]), dementia, CAD  Significant Hospital Events: Including procedures, antibiotic start and stop dates in addition to other pertinent events   3/25 admitted cardiology 3/26 Echo > EF 20-25%, G2DD. 3/30 > off precedex and started on low dose hydromorphone for air hunger  Interim History / Subjective:  Remains on milrinone 0.125 and NE 14.  Precedex off. Has tachypnea and air hunger still despite low dose hydromorphone.   Objective   Blood pressure 96/72, pulse (!) 138, temperature (!) 97.5 F (36.4 C), temperature source Axillary, resp. rate (!) 28, height 5\' 2"  (1.575 m), weight 61.1 kg, SpO2 94 %. CVP:  [11 mmHg-17 mmHg] 15 mmHg      Intake/Output Summary (Last 24 hours) at 04/17/2020 0724 Last data filed at 04/17/2020 0513 Gross per 24 hour  Intake 1985.75 ml  Output --  Net 1985.75 ml   Filed Weights   04/13/20 0500 04/14/20 0500 04/17/20 0327  Weight: 57.4 kg 57.5 kg 61.1  kg    Examination: General: Elderly female, in NAD. Neuro: Eyes open and tracking but not following commands. HEENT: [place]/AT. Sclerae anicteric. EOMI. Cardiovascular: Tachy, 04/19/20, no M/R/G.  Lungs: Respirations shallow and labored.  CTA bilaterally, No W/R/R. Abdomen: BS x 4, soft, NT/ND.  Musculoskeletal: No gross deformities, no edema.  Skin: Intact, warm, no rashes.   Resolved Hospital Problem list    Assessment & Plan:   Cardiogenic shock - echo 3/26 with EF 20-25%, G2DD> Inferolateral STEMI- medical management only ICM - Cards following, medical management only. - Continue milrinone and NE as needed for goal MAP > 65 (have asked RN to wean NE down given MAP 80 and AFRVR). - Continue heparin, ASA, Brilinta, Lipitor. - Trend LFTs  PAF. - Per cardiology, continue Heparin and Amiodarone.  AKI - worsened despite lasix and fluid challenges.  Remains  Anuric. - Nephrology seeing, not a HD candidate. - Unfortunately she has not responded and will continue to worsen.  Would recommend transition focus to palliation. - trend renal indices and UOP  Air hunger. - Continue low dose hydromorphone.  DM - SSI.   Agitated delirium with underlying dementia.  - delirium precautions. - no other signs of infection/ fever to be contributing at this time, continue to clinically monitor at this time. - Push normal sleep / wake cycle.  Poor PO intake/ at risk for malnutrition. - evaluated by SLP 3/29, placed on dysphagia 2 diet. - aspiration precautions.   Recommend transition to palliation given worsening renal function which will ultimately cause her to decline further  to the point of her demise.   Best practice (right click and "Reselect all SmartList Selections" daily)  Diet:  Oral Pain/Anxiety/Delirium protocol (if indicated): Yes (RASS goal 0) VAP protocol (if indicated): Not indicated DVT prophylaxis: Systemic AC GI prophylaxis: PPI Glucose control:  SSI Yes Central venous  access:  N/A, PICC Arterial line:  N/A Foley:  N/A Mobility:  bed rest  PT consulted: N/A Last date of multidisciplinary goals of care discussion [3/29 per primary] Code Status:  DNR as of 3/29 Disposition: Cardiac ICU  CC time: 30 min.   Rutherford Guys, PA - C [facility] For pager details, please see AMION If no response to pager, please call [phone] until 7:00 PM After 7:00 PM, please call Elink at [phone] 04/17/2020, 7:24 AM

## 2020-04-17 NOTE — Consult Note (Signed)
Consultation Note Date: 04/17/2020   Patient Name: Jamie Mueller  DOB: 05/16/1930  MRN: [id number]  Age / Sex: 87 y.o., female  PCP: Polite, Ronald, MD Referring Physician: Berry, Jonathan J, MD  Reason for Consultation: Establishing goals of care  HPI/Patient Profile: 87 y.o. female  with past medical history of  hypertension, diabetes, hyperlipidemia, A. Fib (not on AC), and dementia admitted on 04/01/2020 with A. fib with RVR, inferolateral STEMI, and AKI. Cardiology elected for medical management. Hospitalization complicated by progressive anuric AKI and cardiogenic shock. Not a candidate for dialysis.  She has been supported on milrinone and norepinephrine.  She has had ongoing confusion to the point of pulling out her PICC on 3/28.  3/29 hypotension, mental status, and HR worsened. PMT consulted to discuss GOC.  Clinical Assessment and Goals of Care: I have reviewed medical records including EPIC notes, labs and imaging, received report from RN, assessed the patient and then met with patient's daughter, Beverly, and her 2 sons, Tommy and Marty,  to discuss diagnosis prognosis, GOC, EOL wishes, disposition and options.  I introduced Palliative Medicine as specialized medical care for people living with serious illness. It focuses on providing relief from the symptoms and stress of a serious illness. The goal is to improve quality of life for both the patient and the family.  Family shares that patient's current status is shocking to them as she has been doing very well at home. She lives with her son Tommy. Tommy reports she has been up walking around, eating well, and usually good cognitive status.    We discussed patient's current illness and what it means in the larger context of patient's on-going co-morbidities.  Natural disease trajectory and expectations at EOL were discussed. They understand her heart is very weak and her kidneys are  failing. They understand she is not a candidate for invasive cardiac work up or HD.   I attempted to elicit values and goals of care important to the patient.  Family shares about how their father passed away - he was on a ventilator and received very aggressive care at end of life. Family tells me he still passed away despite aggressive interventions and they wish he would not have passed away while enduring such aggressive medical care.   The difference between aggressive medical intervention and comfort care was considered in light of the patient's goals of care. Family understands patient continues to worsen despite aggressive interventions and they would like to transition to focus on her comfort.   We discuss timing of transition - we discuss uncertainty of how patient will do off pressors/milrinone. They request time today for other family to visit prior to stopping continuous infusions. However, we will go ahead and stop other interventions that do not promote comfort such as lab work and other medications. We will plan to follow up tomorrow morning regarding timing of stopping infusions and moving out of ICU. Liberalize medications to promote comfort including dilaudid, ativan, haldol, and robinul.  We did discuss that patient is at high risk - may pass away at any time. They express understanding.   Discussed with patient/family the importance of continued conversation with family and the medical providers regarding overall plan of care and treatment options, ensuring decisions are within the context of the patient's values and GOCs.    Questions and concerns were addressed. The family was encouraged to call with questions or concerns.   Primary Decision Maker NEXT OF KIN - children Beverly, Marty,   Tommy   SUMMARY OF RECOMMENDATIONS   - more comfort focused care but maintain continuous infusions of NE and milrinone for today until other family members can visit - unrestricted  visitation - PMT to follow up tomorrow AM regarding stopping infusions/move out of ICU - liberalize comfort medications - PRN dilaudid, ativan, haldol, robinul  Code Status/Advance Care Planning:  DNR  Prognosis:   < 2 weeks  Discharge Planning: Anticipated Hospital Death      Primary Diagnoses: Present on Admission: . Acute ST elevation myocardial infarction (STEMI) of anterolateral wall (HCC) . Atrial fibrillation with RVR (HCC) . Acute renal failure superimposed on stage 3a chronic kidney disease (HCC) . Type 2 diabetes mellitus with other circulatory complications (HCC) . Hyperlipidemia associated with type 2 diabetes mellitus (HCC) . Essential hypertension . PAF (paroxysmal atrial fibrillation) (HCC) . Cardiogenic shock (HCC) . Hypomagnesemia . Acute combined systolic and diastolic heart failure (HCC)   I have reviewed the medical record, interviewed the patient and family, and examined the patient. The following aspects are pertinent.  Past Medical History:  Diagnosis Date  . Diabetes mellitus without complication (HCC)   . Hypercholesteremia   . Hypertension    benign  . Memory loss   . PAF (paroxysmal atrial fibrillation) (HCC)   . Vitamin B 12 deficiency    Social History   Socioeconomic History  . Marital status: Married    Spouse name: Not on file  . Number of children: Not on file  . Years of education: Not on file  . Highest education level: Not on file  Occupational History  . Not on file  Tobacco Use  . Smoking status: Never Smoker  . Smokeless tobacco: Never Used  Vaping Use  . Vaping Use: Never used  Substance and Sexual Activity  . Alcohol use: No    Alcohol/week: 0.0 standard drinks  . Drug use: No  . Sexual activity: Not on file  Other Topics Concern  . Not on file  Social History Narrative  . Not on file   Social Determinants of Health   Financial Resource Strain: Not on file  Food Insecurity: Not on file  Transportation  Needs: Not on file  Physical Activity: Not on file  Stress: Not on file  Social Connections: Not on file   Family History  Problem Relation Age of Onset  . Cancer Father        lung   Scheduled Meds: . sodium chloride flush  10-40 mL Intracatheter Q12H   Continuous Infusions: . amiodarone 30 mg/hr (04/17/20 1100)  . heparin 800 Units/hr (04/17/20 1100)  . milrinone 0.125 mcg/kg/min (04/17/20 1100)  . norepinephrine (LEVOPHED) Adult infusion 16 mcg/min (04/17/20 1100)   PRN Meds:.acetaminophen (TYLENOL) oral liquid 160 mg/5 mL, antiseptic oral rinse, glycopyrrolate **OR** glycopyrrolate **OR** glycopyrrolate, haloperidol **OR** haloperidol **OR** haloperidol lactate, HYDROmorphone (DILAUDID) injection, levalbuterol, LORazepam **OR** LORazepam **OR** LORazepam, ondansetron **OR** ondansetron (ZOFRAN) IV, polyvinyl alcohol, sodium chloride flush No Known Allergies Review of Systems  Unable to perform ROS: Patient unresponsive    Physical Exam Constitutional:      Comments: Does not respond to voice or gentle touch  Pulmonary:     Comments: Unlabored, regular Skin:    General: Skin is warm and dry.     Vital Signs: BP 93/60 (BP Location: Left Arm)   Pulse (!) 158   Temp 98.7 F (37.1 C) (Oral)   Resp (!) 21   Ht 5' 2" (1.575 m)   Wt 61.1 kg     SpO2 97%   BMI 24.64 kg/m  Pain Scale: PAINAD POSS *See Group Information*: S-Acceptable,Sleep, easy to arouse Pain Score: Asleep   SpO2: SpO2: 97 % O2 Device:SpO2: 97 % O2 Flow Rate: .O2 Flow Rate (L/min): 2 L/min  IO: Intake/output summary:   Intake/Output Summary (Last 24 hours) at 04/17/2020 1128 Last data filed at 04/17/2020 1100 Gross per 24 hour  Intake 2129.17 ml  Output --  Net 2129.17 ml    LBM: Last BM Date: 04/16/20 Baseline Weight: Weight: 59 kg Most recent weight: Weight: 61.1 kg     Palliative Assessment/Data: PPS 10%    Time Total: 65 minutes Greater than 50%  of this time was spent counseling and  coordinating care related to the above assessment and plan.   Lee , DNP, AGNP-C Palliative Medicine Team [phone] Pager: [phone]  

## 2020-04-18 DIAGNOSIS — R570 Cardiogenic shock: Secondary | ICD-10-CM | POA: Diagnosis not present

## 2020-04-18 DIAGNOSIS — I5041 Acute combined systolic (congestive) and diastolic (congestive) heart failure: Secondary | ICD-10-CM | POA: Diagnosis not present

## 2020-04-18 DIAGNOSIS — I4891 Unspecified atrial fibrillation: Secondary | ICD-10-CM | POA: Diagnosis not present

## 2020-04-18 DIAGNOSIS — N170 Acute kidney failure with tubular necrosis: Secondary | ICD-10-CM | POA: Diagnosis not present

## 2020-04-18 DIAGNOSIS — R0682 Tachypnea, not elsewhere classified: Secondary | ICD-10-CM

## 2020-04-18 DIAGNOSIS — Z7189 Other specified counseling: Secondary | ICD-10-CM | POA: Diagnosis not present

## 2020-04-18 DIAGNOSIS — I2109 ST elevation (STEMI) myocardial infarction involving other coronary artery of anterior wall: Secondary | ICD-10-CM | POA: Diagnosis not present

## 2020-04-18 MED ORDER — WHITE PETROLATUM EX OINT
TOPICAL_OINTMENT | CUTANEOUS | Status: AC
  Filled 2020-04-18: qty 28.35

## 2020-04-18 NOTE — Progress Notes (Signed)
Family inquiring about the rationale behind patient being more alert and interactive today with visitation. Family educated on renal failure and patient comfort with pain medication dosing. Family expressed hesitation on discontinuing medications tomorrow if patient continues to rest comfortably and remain more oriented and alert; however, wish to continue with comfort care and keeping patient as comfortable as possible. Family instructed that we would honor their wishes and maintain comfort for the patient until they're ready with proceeding forward on discontinuing drips. Patient currently resting comfortable and denies pain or need for repositioning.

## 2020-04-18 NOTE — Progress Notes (Signed)
Daily Progress Note   Patient Name: Jamie Mueller       Date: 04/18/2020 DOB: August 09, 1930  Age: 87 y.o. MRN#: [id number] Attending Physician: Runell Gess, MD Primary Care Physician: Renford Dills, MD Admit Date: 03/27/2020  Reason for Consultation/Follow-up: Establishing goals of care  Subjective: Patient more alert but appears uncomfortable, tachypneic - cannot answer my questions. Son Crawford at bedside.   Length of Stay: 6  Current Medications: Scheduled Meds:  . sodium chloride flush  10-40 mL Intracatheter Q12H    Continuous Infusions: . milrinone 0.125 mcg/kg/min (04/18/20 1000)  . norepinephrine (LEVOPHED) Adult infusion 25 mcg/min (04/18/20 1023)    PRN Meds: acetaminophen (TYLENOL) oral liquid 160 mg/5 mL, antiseptic oral rinse, glycopyrrolate **OR** glycopyrrolate **OR** glycopyrrolate, haloperidol **OR** haloperidol **OR** haloperidol lactate, HYDROmorphone (DILAUDID) injection, levalbuterol, LORazepam **OR** LORazepam **OR** LORazepam, ondansetron **OR** ondansetron (ZOFRAN) IV, polyvinyl alcohol, sodium chloride flush  Physical Exam Constitutional:      Comments: Appears uncomfortable  Cardiovascular:     Rate and Rhythm: Tachycardia present.  Pulmonary:     Comments: Tachypnea, shallow Skin:    General: Skin is warm and dry.  Neurological:     Mental Status: She is alert. She is disoriented.     Motor: Weakness present.             Vital Signs: BP (!) 103/55   Pulse (!) 131   Temp 98.6 F (37 C)   Resp (!) 33   Ht 5\' 2"  (1.575 m)   Wt 61.1 kg   SpO2 100%   BMI 24.64 kg/m  SpO2: SpO2: 100 % O2 Device: O2 Device: Nasal Cannula O2 Flow Rate: O2 Flow Rate (L/min): 2 L/min  Intake/output summary:   Intake/Output Summary (Last 24 hours) at 04/18/2020  1057 Last data filed at 04/18/2020 1000 Gross per 24 hour  Intake 1254.69 ml  Output 0 ml  Net 1254.69 ml   LBM: Last BM Date: 04/16/20 Baseline Weight: Weight: 59 kg Most recent weight: Weight: 61.1 kg       Palliative Assessment/Data: PPS 10%    Flowsheet Rows   Flowsheet Row Most Recent Value  Intake Tab   Referral Department Cardiology  Unit at Time of Referral ICU  Palliative Care Primary Diagnosis Cardiac  Date Notified 04/16/20  Palliative Care Type Return patient Palliative Care  Reason for referral Clarify Goals of Care  Date of Admission 04/10/2020  Date first seen by Palliative Care 04/17/20  # of days Palliative referral response time 1 Day(s)  # of days IP prior to Palliative referral 5  Clinical Assessment   Palliative Performance Scale Score 10%  Psychosocial & Spiritual Assessment   Palliative Care Outcomes   Patient/Family meeting held? Yes  Who was at the meeting? daughter, 2 sons  Palliative Care Outcomes Improved pain interventions, Improved non-pain symptom therapy, Clarified goals of care, Provided end of life care assistance, Provided psychosocial or spiritual support, Changed to focus on comfort      Patient Active Problem List   Diagnosis Date Noted  . Cardiogenic shock (HCC) 04/14/2020  . Hypomagnesemia 04/14/2020  . Ischemic cardiomyopathy 04/14/2020  . Acute combined systolic and diastolic heart failure (HCC) 04/14/2020  .  Acute ST elevation myocardial infarction (STEMI) of anterolateral wall (HCC) 03/20/2020  . Atrial fibrillation with RVR (HCC)   . Acute renal failure superimposed on stage 3a chronic kidney disease (HCC)   . Diabetic renal disease (HCC) 01/04/2020  . Inflammatory and toxic neuropathy (HCC) 01/04/2020  . Memory loss 01/04/2020  . Peripheral vascular disease (HCC) 01/04/2020  . Pure hypercholesterolemia 01/04/2020  . Vitamin B12 deficiency (non anemic) 01/04/2020  . Educated about COVID-19 virus infection 06/04/2019  .  Dyslipidemia 10/25/2018  . Medication management 05/27/2017  . PAF (paroxysmal atrial fibrillation) (HCC) 05/27/2017  . CKD (chronic kidney disease), stage III (HCC) 05/27/2017  . Cardiac arrhythmia 04/11/2017  . Palpitation 04/11/2017  . Chest discomfort 03/30/2017  . Essential hypertension 03/30/2017  . Type 2 diabetes mellitus with other circulatory complications (HCC) 03/30/2017  . Hyperlipidemia associated with type 2 diabetes mellitus (HCC) 03/30/2017    Palliative Care Assessment & Plan   HPI:  87 y.o. female  with past medical history of hypertension, diabetes, hyperlipidemia, A. Fib (not on [facility]), and dementia admitted on 01-May-2020 withA. fib with RVR, inferolateral STEMI, and AKI. Cardiology elected for medical management.Hospitalization complicated by progressive anuric AKI and cardiogenic shock. Not a candidate for dialysis. She has been supported on milrinone and norepinephrine. She has had ongoing confusion to the point of pulling out her PICC on 3/28. 3/29 hypotension, mental status, and HR worsened. PMT consulted to discuss GOC.  Assessment: Follow up with son Tommy at bedside. He confirms plan to continue current measures - careful balance between maintaining BP with infusions but also allowing for comfort medications. They would like to allow time for additional family to visit prior to d/c levophed and milrinone. Orvilla Fus tells me he thinks they will be ready tomorrow 4/2. Continue to promote comfort above all else.   Patient appears uncomfortable during my visit - requested RN administer dilaudid. Son agrees.   Recommendations/Plan:  Promote comfort above all else however continue milrinone and levophed until family has time to visit - coming today 4/1  Family now plans to stop infusions 4/2  unrestricted visitation  Comfort medications PRN - dilaudid, ativan, haldol, robinul  Code Status:  DNR  Prognosis:   < 2 weeks  Discharge Planning:  Anticipated  Hospital Death  Care plan was discussed with son Orvilla Fus and RN  Thank you for allowing the Palliative Medicine Team to assist in the care of this patient.   Total Time 20 minutes Prolonged Time Billed  no       Greater than 50%  of this time was spent counseling and coordinating care related to the above assessment and plan.  Gerlean Ren, DNP, [facility] Palliative Medicine Team Team Phone # [phone]  Pager [phone]

## 2020-04-18 NOTE — Progress Notes (Signed)
    Stable night last night.  Levophed was titrated due to low blood pressures.   Appreciate palliative care team seen the patient yesterday.  Plan is to allow for time for emergent family to come see the patient in likely withdraw amiodarone & Levophed.    Principal Problem:   Acute ST elevation myocardial infarction (STEMI) of anterolateral wall (HCC) Active Problems:   PAF (paroxysmal atrial fibrillation) (HCC)   Atrial fibrillation with RVR (HCC)   Acute renal failure superimposed on stage 3a chronic kidney disease (HCC)   Cardiogenic shock (HCC)   Ischemic cardiomyopathy   Acute combined systolic and diastolic heart failure (HCC)   Type 2 diabetes mellitus with other circulatory complications (HCC)   Essential hypertension   Hyperlipidemia associated with type 2 diabetes mellitus (HCC)   Hypomagnesemia  She is borderline alert awake today.  Mostly moaning.  Anterior lung fields are clear.  Remains in A. fib with rates in the 130s.  Plan:  Discontinue heparin and amiodarone  No further blood draws  Awaiting family to see the patient at which time we will likely withdraw both milrinone and Levophed and start comfort care measures.    Bryan Lemma, MD

## 2020-04-18 NOTE — Progress Notes (Signed)
Patient alert and resting comfortably in bed. Family remains at bedside. Patient denies discomfort at this time and denies need to be repositioned or turned. Patient currently denying to be bathed and daughter asked for bath to be done at night with PRN pain dosing once the family leaves. Patient agrees with plan of care.

## 2020-04-18 DEATH — deceased

## 2020-04-19 DIAGNOSIS — I5041 Acute combined systolic (congestive) and diastolic (congestive) heart failure: Secondary | ICD-10-CM | POA: Diagnosis not present

## 2020-04-19 DIAGNOSIS — Z515 Encounter for palliative care: Secondary | ICD-10-CM | POA: Diagnosis not present

## 2020-04-19 DIAGNOSIS — I2109 ST elevation (STEMI) myocardial infarction involving other coronary artery of anterior wall: Secondary | ICD-10-CM | POA: Diagnosis not present

## 2020-04-19 DIAGNOSIS — R109 Unspecified abdominal pain: Secondary | ICD-10-CM | POA: Diagnosis not present

## 2020-04-19 MED ORDER — SODIUM CHLORIDE 0.9 % IV SOLN
0.5000 mg/h | INTRAVENOUS | Status: DC
  Filled 2020-04-19 (×3): qty 2.5

## 2020-04-19 MED ORDER — HYDROMORPHONE BOLUS VIA INFUSION
0.5000 mg | INTRAVENOUS | Status: DC | PRN
  Filled 2020-04-19: qty 1

## 2020-04-19 NOTE — Progress Notes (Signed)
Palliative afternoon follow up.     Met with Jamie Mueller and Jamie Mueller.  Discussed their mother's current condition.  Jamie Mueller expressed concern because her mother had such a wonderful day yesterday (recognized everyone, enjoyed talking with everyone), and today she is sedated.    I expressed that Jamie Mueller had been moaning and in pain this morning.  She was complaining of abdominal pain.  We adjusted the dilaudid dosing and a couple of hours later Jamie Mueller was still moaning and in pain.  A PRN dose of 1 mg of ativan was given and now Jamie Mueller is sleeping comfortably but not responsive to family.  I understood that family likely felt as though she was going to be good today (as she was yesterday) and they had been robbed of time with her.  We talked about the fact that the patient is unable to make urine (bladder scan was zero) and yet she continues to receive continuous gtts of milrinone and nor epi.   This fluid will build up in her system and likely make it difficult to breathe over time.   Family understands.  All family members agree that they want her to be comfortable and they do not want her to suffer.  They request that we continue comfort measures and leave everything in place - nor epi, milrinone, and dilaudid gtt for now.  They are hopeful that the lorazepam will wear off and that their mother may have some wakeful time tomorrow.  We agreed to gather tomorrow and reassess.    Family aware that  their mother is near end of life.  Their prior is her comfort, but if they could have more wakeful time with her that would be a blessing.   Recommendations:  1.  Comfort Care:  Leave milrinone and Nor Epi in place for now.  Do NOT Escalate dosage.   Allow family to visit 3 at a time at bedside. 2.  Reassess tomorrow when Ativan has worn off - will determine what to do with Nor Epi and Milrinone at that time. 3.  Family aware that continuing drips at this point may cause volume  overload and respiratory distress.  If patient shows signs that this is occurring please call family and explain what is happening (Patient is at EOL) - discontinue nor epi and milrinone drips and and increase dilaudid drip.  Family wants to be present if possible when milrinone and nor epi are discontinued. 4.  PMT will meet with family again on 4/3.  Florentina Jenny, PA-C Palliative Medicine Office:  [phone]  45 min.

## 2020-04-19 NOTE — Progress Notes (Signed)
Daily Progress Note   Patient Name: Jamie Mueller       Date: 04/19/2020 DOB: July 07, 1930  Age: 87 y.o. MRN#: [id number] Attending Physician: Runell Gess, MD Primary Care Physician: Renford Dills, MD Admit Date: 03/26/2020  Reason for Consultation/Follow-up: To discuss complex medical decision making related to patient's goals of care Spoke briefly with Dr. Anne Fu.  I understand the family would like to continue at this level of pressors and milrinone until they are able to say goodbye.  Subjective: Examined patient this am with bedside RN.  Patient is moaning "Help me lord".  RN asks what is hurting?  Patient replies "my stomach".  RN comforts patient and explains we will get pain medicine to help.  Artemisia replies "please do anything to help me".  Her heart rate is in the 130 - 140s.  She continues to moan and is visibly uncomfortable.   Assessment: Patient visibly uncomfortable.  No family at bedside.  Abdominal pain was her presenting anginal symptom.   Patient Profile/HPI: 87 y.o.femalewith past medical history of hypertension, diabetes, hyperlipidemia, A. Fib (not on AC), and dementiaadmitted on 3/25/2022withA. fib with RVR, inferolateral STEMI, and AKI. Cardiology elected for medical management.Hospitalization complicated by progressive anuric AKI and cardiogenic shock. Not a candidate for dialysis. She has been supported on milrinone and norepinephrine. She has had ongoing confusion to the point of pulling out her PICC on 3/28.3/29 hypotension, mental status, and HR worsened. PMT consulted to discuss GOC.   Length of Stay: 7   Vital Signs: BP 119/75   Pulse (!) 118   Temp 98.6 F (37 C)   Resp (!) 29   Ht 5\' 2"  (1.575 m)   Wt 61.1 kg   SpO2 100%   BMI  24.64 kg/m  SpO2: SpO2: 100 % O2 Device: O2 Device: Nasal Cannula O2 Flow Rate: O2 Flow Rate (L/min): 2 L/min       Palliative Assessment/Data: 20%     Palliative Care Plan    Recommendations/Plan:  Will continue drips per family request but need to also treat patient's abdominal pain and suffering.  Will initiate a very low dose dilaudid gtt (0.25 mg / hour). In addition to PRN boluses she has been receiving.  Have requested RN contact me with family is at bedside so that I can update  them on her condition.  I'm very worried she has reached the point of sacrificing comfort to maintain vital signs.   In order to stop her suffering I fear her blood pressure will drop even on the nor epi and milrinone.  Code Status:  DNR  Prognosis:   Hours - Days   Discharge Planning:  To Be Determined Hospital death vs Hospice House is most likely.  Care plan was discussed with Dr. Anne Fu.  Will speak with family when they arrive.  Thank you for allowing the Palliative Medicine Team to assist in the care of this patient.  Total time spent:  35 min.     Greater than 50%  of this time was spent counseling and coordinating care related to the above assessment and plan.  Norvel Richards, PA-C Palliative Medicine  Please contact Palliative MedicineTeam phone at [phone] for questions and concerns between 7 am - 7 pm.   Please see AMION for individual provider pager numbers.

## 2020-04-19 NOTE — Progress Notes (Signed)
Progress Note  Patient Name: Jamie Mueller Date of Encounter: 04/19/2020  [facility] Cardiologist: Rollene Rotunda, MD   Subjective   Moaning in bed  Inpatient Medications    Scheduled Meds: . sodium chloride flush  10-40 mL Intracatheter Q12H   Continuous Infusions: . milrinone 0.125 mcg/kg/min (04/19/20 0600)  . norepinephrine (LEVOPHED) Adult infusion 26 mcg/min (04/19/20 0807)   PRN Meds: acetaminophen (TYLENOL) oral liquid 160 mg/5 mL, antiseptic oral rinse, glycopyrrolate **OR** glycopyrrolate **OR** glycopyrrolate, haloperidol **OR** haloperidol **OR** haloperidol lactate, HYDROmorphone (DILAUDID) injection, levalbuterol, LORazepam **OR** LORazepam **OR** LORazepam, ondansetron **OR** ondansetron (ZOFRAN) IV, polyvinyl alcohol, sodium chloride flush   Vital Signs    Vitals:   04/19/20 0430 04/19/20 0500 04/19/20 0530 04/19/20 0600  BP: 115/69 (!) 92/32 115/82 119/75  Pulse: (!) 129 (!) 124 (!) 132 (!) 118  Resp: (!) 31 (!) 25 (!) 21 (!) 29  Temp:      TempSrc:      SpO2: 100% 94% 93% 100%  Weight:      Height:        Intake/Output Summary (Last 24 hours) at 04/19/2020 0911 Last data filed at 04/19/2020 0600 Gross per 24 hour  Intake 580.72 ml  Output --  Net 580.72 ml   Last 3 Weights 04/17/2020 04/14/2020 04/13/2020  Weight (lbs) 134 lb 11.2 oz 126 lb 12.2 oz 126 lb 8.7 oz  Weight (kg) 61.1 kg 57.5 kg 57.4 kg      Telemetry    Atrial fibrillation 137- Personally Reviewed  ECG    No new- Personally Reviewed  Physical Exam   GEN:  Laying in bed, moaning.   Neck: No JVD Cardiac: Irregularly irregular tachycardic, no murmurs, rubs, or gallops.  Respiratory: Clear to auscultation bilaterally. GI: Soft, nontender, non-distended  MS: No edema; No deformity. Neuro:  Nonfocal  Psych: Baseline Moding  Labs    High Sensitivity Troponin:   Recent Labs  Lab 03/25/2020 2217 03/24/2020 0238 04/07/2020 0602  TROPONINIHS >135,540* >27,000* >27,000*       Chemistry Recent Labs  Lab 04/15/20 0052 04/16/20 0158 04/17/20 0333  NA 137 133* 124*  K 4.3 4.6 4.2  CL 101 101 92*  CO2 17* 17* 16*  GLUCOSE 159* 234* 490*  BUN 49* 66* 65*  CREATININE 3.84* 4.87* 5.24*  CALCIUM 8.3* 7.7* 7.0*  PROT 5.8* 5.4*  --   ALBUMIN 2.6* 2.5*  --   AST 255* 126*  --   ALT 109* 84*  --   ALKPHOS 99 100  --   BILITOT 1.6* 1.9*  --   GFRNONAA 11* 8* 7*  ANIONGAP 19* 15 16*     Hematology Recent Labs  Lab 04/15/20 0052 04/16/20 0158 04/17/20 0333  WBC 13.3* 13.1* 9.4  RBC 3.54* 3.39* 3.07*  HGB 9.6* 9.2* 8.4*  HCT 29.5* 26.7* 24.4*  MCV 83.3 78.8* 79.5*  MCH 27.1 27.1 27.4  MCHC 32.5 34.5 34.4  RDW 14.6 14.2 14.1  PLT 229 221 171    BNPNo results for input(s): BNP, PROBNP in the last 168 hours.   DDimer No results for input(s): DDIMER in the last 168 hours.   Radiology    No results found.  Cardiac Studies   EF 25%  Patient Profile     87 y.o. female with anterolateral STEMI in the setting of A. fib RVR, dementia with cardiogenic shock AKI inotropic support  Assessment & Plan    Anterior lateral STEMI -Echo findings consistent with LAD stenosis related  to STEMI delayed presentation with EF of 20 to 25% and persistent ST elevation suggesting possible apical aneurysm.  Appreciate palliative care team discussion.  Noninvasive management.  Cardiogenic shock/systolic heart failure -Palliation at this point.  A. fib with RVR -On amiodarone and remained tachycardic.  Difficult to titrate secondary to cardiogenic shock and hypotension.  Acute kidney injury -Creatinine 5.24, worse again today.  Acute renal failure.  DNR -Yesterday appreciate palliative care discussion with family.  Pain control promoted.  Plan was to try to continue milrinone and Levophed until family had time to visit and likely stop infusions today.  Orvilla Fus is her son, understands the gravity of the situation.  I had conversation with palliative care team as  well as nursing team today.  Agree with pursuing comfort measures.  CRITICAL CARE Performed by: Donato Schultz   Total critical care time: 35 minutes  Critical care time was exclusive of separately billable procedures and treating other patients.  Critical care was necessary to treat or prevent imminent or life-threatening deterioration.  Critical care was time spent personally by me on the following activities: development of treatment plan with patient and/or surrogate as well as nursing, discussions with consultants, evaluation of patient's response to treatment, examination of patient, obtaining history from patient or surrogate, ordering and performing treatments and interventions, ordering and review of laboratory studies, ordering and review of radiographic studies, pulse oximetry and re-evaluation of patient's condition.    For questions or updates, please contact [facility] Please consult [email or web address] for contact info under        Signed, Donato Schultz, MD  04/19/2020, 9:11 AM

## 2020-04-20 DIAGNOSIS — Z515 Encounter for palliative care: Secondary | ICD-10-CM | POA: Diagnosis not present

## 2020-04-20 DIAGNOSIS — N170 Acute kidney failure with tubular necrosis: Secondary | ICD-10-CM | POA: Diagnosis not present

## 2020-04-20 DIAGNOSIS — I2109 ST elevation (STEMI) myocardial infarction involving other coronary artery of anterior wall: Secondary | ICD-10-CM | POA: Diagnosis not present

## 2020-04-20 DIAGNOSIS — I5041 Acute combined systolic (congestive) and diastolic (congestive) heart failure: Secondary | ICD-10-CM | POA: Diagnosis not present

## 2020-04-20 DIAGNOSIS — R109 Unspecified abdominal pain: Secondary | ICD-10-CM | POA: Diagnosis not present

## 2020-04-20 MED ORDER — HYDROMORPHONE BOLUS VIA INFUSION
0.5000 mg | INTRAVENOUS | Status: DC | PRN
  Filled 2020-04-20: qty 1

## 2020-05-18 NOTE — Progress Notes (Signed)
Progress Note  Patient Name: Jamie Mueller Date of Encounter: 05-14-2020  [facility] Cardiologist: Rollene Rotunda, MD   Subjective   Appears more comfortable in bed.  Sedate but not in pain  Inpatient Medications    Scheduled Meds: . sodium chloride flush  10-40 mL Intracatheter Q12H   Continuous Infusions: . HYDROmorphone 0.125 mg/hr (05-14-2020 0600)  . milrinone 0.125 mcg/kg/min (05-14-20 1108)  . norepinephrine (LEVOPHED) Adult infusion 28 mcg/min (2020-05-14 0600)   PRN Meds: acetaminophen (TYLENOL) oral liquid 160 mg/5 mL, antiseptic oral rinse, glycopyrrolate **OR** glycopyrrolate **OR** glycopyrrolate, haloperidol **OR** haloperidol **OR** haloperidol lactate, HYDROmorphone, HYDROmorphone (DILAUDID) injection, levalbuterol, ondansetron **OR** ondansetron (ZOFRAN) IV, polyvinyl alcohol, sodium chloride flush   Vital Signs    Vitals:   2020/05/14 0800 05/14/20 0900 May 14, 2020 1000 05/14/20 1100  BP: (!) 88/50 (!) 87/73 (!) 90/49 (!) 81/49  Pulse: (!) 127  (!) 130 (!) 135  Resp: 16 18 17  (!) 22  Temp:      TempSrc:      SpO2: 93%  91% 97%  Weight:      Height:        Intake/Output Summary (Last 24 hours) at 05/14/20 1155 Last data filed at 2020/05/14 0600 Gross per 24 hour  Intake 520.61 ml  Output --  Net 520.61 ml   Last 3 Weights 04/17/2020 04/14/2020 04/13/2020  Weight (lbs) 134 lb 11.2 oz 126 lb 12.2 oz 126 lb 8.7 oz  Weight (kg) 61.1 kg 57.5 kg 57.4 kg      Telemetry    Atrial fibrillation 130s- Personally Reviewed  ECG    No new- Personally Reviewed  Physical Exam   GEN:  Elderly Neck: No JVD Cardiac:  Irregularly irregular tachycardic Respiratory:  Comfortable respiratory effort. Neuro:   Appears nonfocal  Psych: Sedate  Labs    High Sensitivity Troponin:   Recent Labs  Lab 03/28/2020 2217 03/25/2020 0238 03/30/2020 0602  TROPONINIHS >135,540* >27,000* >27,000*      Chemistry Recent Labs  Lab 04/15/20 0052 04/16/20 0158  04/17/20 0333  NA 137 133* 124*  K 4.3 4.6 4.2  CL 101 101 92*  CO2 17* 17* 16*  GLUCOSE 159* 234* 490*  BUN 49* 66* 65*  CREATININE 3.84* 4.87* 5.24*  CALCIUM 8.3* 7.7* 7.0*  PROT 5.8* 5.4*  --   ALBUMIN 2.6* 2.5*  --   AST 255* 126*  --   ALT 109* 84*  --   ALKPHOS 99 100  --   BILITOT 1.6* 1.9*  --   GFRNONAA 11* 8* 7*  ANIONGAP 19* 15 16*     Hematology Recent Labs  Lab 04/15/20 0052 04/16/20 0158 04/17/20 0333  WBC 13.3* 13.1* 9.4  RBC 3.54* 3.39* 3.07*  HGB 9.6* 9.2* 8.4*  HCT 29.5* 26.7* 24.4*  MCV 83.3 78.8* 79.5*  MCH 27.1 27.1 27.4  MCHC 32.5 34.5 34.4  RDW 14.6 14.2 14.1  PLT 229 221 171    BNPNo results for input(s): BNP, PROBNP in the last 168 hours.   DDimer No results for input(s): DDIMER in the last 168 hours.   Radiology    No results found.  Cardiac Studies   ECHO ef 25%  Patient Profile     87 y.o. female here with anterolateral STEMI, acute renal failure, cardiogenic shock, A. fib RVR, memory impairment  Assessment & Plan    Anterior lateral STEMI -Echo findings consistent with LAD stenosis EF 20 to 25% persistently elevated ST elevations suggesting possible apical aneurysm.  Appreciate the palliative care team discussion.  Noninvasive management.  Poor prognosis.  Cardiogenic shock/systolic heart failure -Continue with palliative efforts.  Currently on milrinone and norepinephrine.  Do not escalate.  If no progress is made, would discontinue these drips to avoid excessive fluid overload.  Comfort care measures.  Acute renal failure/acute kidney injury -Creatinine yesterday was 5.24.  DNR -Pain control with Dilaudid.  Abdominal discomfort noted previously.  Ativan for anxiety and for comfort.  Once again, appreciate palliative care discussions.  Extremely helpful.  Spoke with 2 family members in the room.  For questions or updates, please contact [facility] Please consult [email or web address] for contact info under         Signed, Donato Schultz, MD  04/24/2020, 11:55 AM

## 2020-05-18 NOTE — Discharge Summary (Signed)
Death Summary    Patient ID: Jamie Mueller MRN: [id number]; DOB: [birth year]  Admit Date: 04/17/2020 Date of Death: 05/13/2020  Primary Care Provider: Renford DillsPolite, Ronald, MD  Primary Cardiologist: Rollene RotundaJames Hochrein, MD  Primary Electrophysiologist:  None   Discharge Diagnoses    Principal Problem:   Acute ST elevation myocardial infarction (STEMI) of anterolateral wall [facility](HCC) Active Problems:   Essential hypertension   Type 2 diabetes mellitus with other circulatory complications (HCC)   Hyperlipidemia associated with type 2 diabetes mellitus (HCC)   PAF (paroxysmal atrial fibrillation) (HCC)   Atrial fibrillation with RVR (HCC)   Acute renal failure superimposed on stage 3a chronic kidney disease (HCC)   Cardiogenic shock (HCC)   Hypomagnesemia   Ischemic cardiomyopathy   Acute combined systolic and diastolic heart failure (HCC)    Diagnostic Studies/Procedures    ECHO 09-14-2020:  1. Left ventricular ejection fraction, by estimation, is 20 to 25%. The  left ventricle has severely decreased function. The left ventricle  demonstrates regional wall motion abnormalities (see scoring  diagram/findings for description). There is moderate  concentric left ventricular hypertrophy. Left ventricular diastolic  parameters are consistent with Grade III diastolic dysfunction  (restrictive). Elevated left atrial pressure.  2. Right ventricular systolic function is moderately reduced. The right  ventricular size is normal. Mildly increased right ventricular wall  thickness. There is moderately elevated pulmonary artery systolic  pressure.  3. Left atrial size was severely dilated.  4. Right atrial size was mildly dilated.  5. A small pericardial effusion is present. The pericardial effusion is  anterior to the right ventricle.  6. The mitral valve is normal in structure. Mild to moderate mitral valve  regurgitation.  7. Tricuspid valve regurgitation is moderate.  8. The aortic  valve is tricuspid. There is mild calcification of the  aortic valve. There is mild thickening of the aortic valve. Aortic valve  regurgitation is not visualized. Mild aortic valve sclerosis is present,  with no evidence of aortic valve  stenosis.  _____________   History of Present Illness     Jamie Mueller is a [age] y.o. female with anterolateral ST elevation myocardial infarction with acute renal failure cardiogenic shock atrial fibrillation with rapid ventricular response and memory impairment.  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  She was demonstrating cardiogenic shock as well as systolic heart failure and was placed on milrinone as well as norepinephrine for inotropic support.  Aggressive medical therapies ensued and she developed worsening acute kidney injury/acute renal failure with creatinine rising into the 5 range.  According to history and physical, she for started reporting abdominal pain the day prior to her presentation and her son that lives with her noted that she was not feeling well.  She was beginning to have severe abdominal discomfort and was brought to the emergency department for further evaluation.  While she was there she was found to be in atrial fibrillation with rapid ventricular response.  An EKG subsequently showed ST segment elevations and high-sensitivity troponin then came back severely elevated at 130,000.  Because of this, code STEMI was called and Dr. Allyson SabalBerry with interventional cardiology assessed her situation and given the onset of symptoms for greater than 24 hours as well as lack of overt ongoing chest discomfort, medical management/noninvasive management ensued.  Daughter was in agreement.  IV  heparin was utilized.  She was  placed in the 2 heart unit and subsequently developed worsening epigastric discomfort as well as labs that were consistent with cardiogenic shock.  Beta-blocker was stopped and milrinone was added.  EF was found to be 20 to 25%.  She was continued on dual antiplatelet therapy as well as IV heparin.  Her creatinine continued to increase suggesting worsening renal failure.  Levophed was added.  Nephrology was consulted but not a candidate for hemodialysis.  She was developing frank cardiogenic shock.  Critical care medicine was also consulted.  Over the next several days she became much left responsive simply laying in bed saying help me Jesus.  Palliative care team became involved.  She remained tachycardic with atrial fibrillation despite amiodarone.  Is decided on 4/1 that comfort measures would ensue.  Family was able to gather over the next 2 days.  Appreciate palliative care intervention.  She was able to pass peacefully on 2020-05-11.   Consultants: Critical care medicine, nephrology, palliative care medicine  Time of death: 4:20 PM _____________  Labs & Radiologic Studies    CBC No results for input(s): WBC, NEUTROABS, HGB, HCT, MCV, PLT in the last 72 hours. Basic Metabolic Panel No results for input(s): NA, K, CL, CO2, GLUCOSE, BUN, CREATININE, CALCIUM, MG, PHOS in the last 72 hours. Liver Function Tests No results for input(s): AST, ALT, ALKPHOS, BILITOT, PROT, ALBUMIN in the last 72 hours. No results for input(s): LIPASE, AMYLASE in the last 72 hours. High Sensitivity Troponin:   Recent Labs  Lab 03/22/2020 2217 04/05/2020 0238 04/13/2020 0602  TROPONINIHS >135,540* >27,000* >27,000*    BNP Invalid input(s): POCBNP D-Dimer No results for input(s): DDIMER in the last 72 hours. Hemoglobin A1C No results for input(s): HGBA1C in the last 72 hours. Fasting Lipid Panel No results for input(s): CHOL, HDL, LDLCALC, TRIG, CHOLHDL, LDLDIRECT in the last 72 hours. Thyroid Function  Tests No results for input(s): TSH, T4TOTAL, T3FREE, THYROIDAB in the last 72 hours.  Invalid input(s): FREET3 _____________  CT ABDOMEN PELVIS WO CONTRAST  Result Date: 03/29/2020 CLINICAL DATA:  Right upper quadrant pain that radiates to back. EXAM: CT ABDOMEN AND PELVIS WITHOUT CONTRAST TECHNIQUE: Multidetector CT imaging of the abdomen and pelvis was performed following the standard protocol without IV contrast. COMPARISON:  None. FINDINGS: Lower chest: Bibasilar atelectasis. Hepatobiliary: Unremarkable noncontrast appearance of the hepatic parenchyma. Gallbladder surgically absent. No biliary ductal dilation. Pancreas: Unremarkable. Spleen: Unremarkable. Adrenals/Urinary Tract: Bilateral adrenal glands are unremarkable. No hydronephrosis. No nephrolithiasis. Urinary bladder is grossly unremarkable for degree of distension. Stomach/Bowel: Stomach is grossly unremarkable for degree of distension. No small bowel dilation. Appendix is visualized in the right lower quadrant and appears unremarkable. Scattered colonic diverticula without findings acute diverticulitis. Vascular/Lymphatic: Aortic atherosclerosis. No enlarged abdominal or pelvic lymph nodes. Reproductive: Status post hysterectomy. No adnexal masses. Other: No abdominopelvic ascites. Musculoskeletal: Mild levoconvex curvature of the spine multilevel degenerative changes spine. Grade 1 degenerative anterolisthesis of L4 on L5. Degenerative change of the hips, SI joints and pubic symphysis. IMPRESSION: 1. No acute findings in the abdomen or pelvis. 2. Colonic diverticulosis without findings of acute diverticulitis. 3. Aortic atherosclerosis. Aortic Atherosclerosis (ICD10-I70.0). Electronically Signed   By: Maudry Mayhew MD   On: 04/05/2020 23:57   DG CHEST PORT 1 VIEW  Result Date: 04/16/2020 CLINICAL DATA:  87 year old female status post PICC line placement. EXAM: PORTABLE CHEST 1 VIEW COMPARISON:  0349 hours today and earlier. FINDINGS:  Portable AP semi upright view  at 1050 hours. Left upper extremity PICC line placed, tip at the cavoatrial junction level. Continued mild rotation to the left. Mildly larger lung volumes and decreased pulmonary vascularity. Bibasilar ventilation has also improved with decreased bilateral veiling opacity. No pneumothorax. Stable surgical clips at the thoracic inlet. No acute osseous abnormality identified. Negative visible bowel gas. IMPRESSION: 1. Left upper extremity PICC line placed, tip at the cavoatrial junction level. 2. Improved lung volumes and ventilation since 0349 hours. Electronically Signed   By: Odessa Fleming M.D.   On: 04/16/2020 11:07   DG CHEST PORT 1 VIEW  Result Date: 04/16/2020 CLINICAL DATA:  87 year old female with wheezing. EXAM: PORTABLE CHEST 1 VIEW COMPARISON:  Portable chest 04/15/2020 and earlier. FINDINGS: Portable AP semi upright view at 0349 hours. Mildly rotated to the left. Stable cardiomegaly and mediastinal contours. Visualized tracheal air column is within normal limits. Ongoing bibasilar pulmonary opacity, more veiling on the right. No definite air bronchograms. Pulmonary vascularity appears improved from yesterday. Stable visualized osseous structures. Surgical clips at the thoracic inlet compatible with prior thyroidectomy. IMPRESSION: 1. Regressed pulmonary edema since yesterday. 2. Ongoing bibasilar veiling and confluent opacity favored to be pleural effusions with lower lobe collapse or consolidation. Electronically Signed   By: Odessa Fleming M.D.   On: 04/16/2020 04:08   DG CHEST PORT 1 VIEW  Result Date: 04/15/2020 CLINICAL DATA:  Dyspnea, myocardial infarction EXAM: PORTABLE CHEST 1 VIEW COMPARISON:  04/14/2020 FINDINGS: Pulmonary insufflation is stable. Small bilateral pleural effusions have developed. No pneumothorax. There is progressive perihilar pulmonary infiltrate most in keeping with trace pulmonary edema. Cardiac size is mildly enlarged. No acute bone abnormality.  IMPRESSION: Progressive cardiogenic failure with increasing perihilar pulmonary edema and developing small bilateral pleural effusions. Stable cardiomegaly. Electronically Signed   By: Helyn Numbers MD   On: 04/15/2020 05:57   DG Chest Port 1 View  Result Date: 04/14/2020 CLINICAL DATA:  Shortness of breath EXAM: PORTABLE CHEST 1 VIEW COMPARISON:  April 12, 2020 FINDINGS: There is no edema or airspace opacity. There has been interval clearing of atelectatic change from the left base. There is cardiomegaly with mild pulmonary venous hypertension. No adenopathy. Postoperative change noted in the upper thoracic region in the midline. IMPRESSION: No edema or airspace opacity. Interval clearing of left base atelectasis. There is cardiomegaly with a degree of pulmonary vascular congestion. Electronically Signed   By: Bretta Bang III M.D.   On: 04/14/2020 12:16   DG CHEST PORT 1 VIEW  Result Date: 04/03/2020 CLINICAL DATA:  Central line placement EXAM: PORTABLE CHEST 1 VIEW COMPARISON:  Portable exam 1216 hours compared to 03/25/2020 FINDINGS: RIGHT arm PICC line with tip projecting over SVC. Enlargement of cardiac silhouette with pulmonary vascular congestion. Mediastinal contours normal. Minimal RIGHT basilar atelectasis. Atelectasis versus consolidation LEFT lower lobe. Minimal LEFT pleural effusion. No RIGHT pleural effusion or pneumothorax. IMPRESSION: Enlargement of cardiac silhouette with pulmonary vascular congestion. Atelectasis versus consolidation LEFT lower lobe with minimal associated LEFT pleural effusion. Electronically Signed   By: Ulyses Southward M.D.   On: 04/01/2020 12:26   DG Chest Portable 1 View  Result Date: 04/07/2020 CLINICAL DATA:  Dyspnea. EXAM: PORTABLE CHEST 1 VIEW COMPARISON:  Remote exam 04/29/2003 FINDINGS: Chronic cardiomegaly unchanged. Normal mediastinal contours. No acute or focal airspace disease. No pulmonary edema, pleural effusion, or pneumothorax. Surgical clips in  the left thoracic inlet. Mild degenerative change of the left shoulder. Scoliotic curvature of the lower thoracic spine. IMPRESSION: Chronic cardiomegaly without acute abnormality.  Electronically Signed   By: Narda Rutherford M.D.   On: 17-Apr-2020 22:51   ECHOCARDIOGRAM COMPLETE  Result Date: 04/15/2020    ECHOCARDIOGRAM REPORT   Patient Name:   DANICKA HOURIHAN Date of Exam: 03/26/2020 Medical Rec #:  [id number]          Height:       62.0 in Accession #:    [id number]         Weight:       130.0 lb Date of Birth:  January 31, 1930         BSA:          1.592 m Patient Age:    87 years           BP:           97/73 mmHg Patient Gender: F                  HR:           107 bpm. Exam Location:  Inpatient Procedure: 2D Echo Indications:    abnormal ecg  History:        Patient has prior history of Echocardiogram examinations, most                 recent 03/31/2017. Chronic kidney disease, Arrythmias:Atrial                 Fibrillation; Risk Factors:Diabetes, Hypertension and                 Dyslipidemia.  Sonographer:    Delcie Roch Referring Phys: [id number] Ralene Ok IMPRESSIONS  1. Left ventricular ejection fraction, by estimation, is 20 to 25%. The left ventricle has severely decreased function. The left ventricle demonstrates regional wall motion abnormalities (see scoring diagram/findings for description). There is moderate concentric left ventricular hypertrophy. Left ventricular diastolic parameters are consistent with Grade III diastolic dysfunction (restrictive). Elevated left atrial pressure.  2. Right ventricular systolic function is moderately reduced. The right ventricular size is normal. Mildly increased right ventricular wall thickness. There is moderately elevated pulmonary artery systolic pressure.  3. Left atrial size was severely dilated.  4. Right atrial size was mildly dilated.  5. A small pericardial effusion is present. The pericardial effusion is anterior to the right ventricle.  6. The  mitral valve is normal in structure. Mild to moderate mitral valve regurgitation.  7. Tricuspid valve regurgitation is moderate.  8. The aortic valve is tricuspid. There is mild calcification of the aortic valve. There is mild thickening of the aortic valve. Aortic valve regurgitation is not visualized. Mild aortic valve sclerosis is present, with no evidence of aortic valve stenosis. FINDINGS  Left Ventricle: Left ventricular ejection fraction, by estimation, is 20 to 25%. The left ventricle has severely decreased function. The left ventricle demonstrates regional wall motion abnormalities. The left ventricular internal cavity size was normal  in size. There is moderate concentric left ventricular hypertrophy. Left ventricular diastolic parameters are consistent with Grade III diastolic dysfunction (restrictive). Elevated left atrial pressure.  LV Wall Scoring: The mid and distal anterior wall, entire anterior septum, and entire apex are akinetic. The antero-lateral wall, inferior wall, posterior wall, mid inferoseptal segment, basal anterior segment, and basal inferoseptal segment are hypokinetic. Right Ventricle: The right ventricular size is normal. Mildly increased right ventricular wall thickness. Right ventricular systolic function is moderately reduced. There is moderately elevated pulmonary artery systolic pressure. The tricuspid regurgitant velocity is 2.78 m/s, and with an assumed right  atrial pressure of 15 mmHg, the estimated right ventricular systolic pressure is 45.9 mmHg. Left Atrium: Left atrial size was severely dilated. Right Atrium: Right atrial size was mildly dilated. Pericardium: A small pericardial effusion is present. The pericardial effusion is anterior to the right ventricle. Mitral Valve: The mitral valve is normal in structure. There is mild thickening of the mitral valve leaflet(s). Mild mitral annular calcification. Mild to moderate mitral valve regurgitation. Tricuspid Valve: The  tricuspid valve is normal in structure. Tricuspid valve regurgitation is moderate. Aortic Valve: The aortic valve is tricuspid. There is mild calcification of the aortic valve. There is mild thickening of the aortic valve. Aortic valve regurgitation is not visualized. Mild aortic valve sclerosis is present, with no evidence of aortic valve stenosis. Pulmonic Valve: The pulmonic valve was grossly normal. Pulmonic valve regurgitation is trivial. Aorta: The aortic root and ascending aorta are structurally normal, with no evidence of dilitation. IAS/Shunts: No atrial level shunt detected by color flow Doppler.  LEFT VENTRICLE PLAX 2D LVIDd:         3.20 cm LVIDs:         2.80 cm LV PW:         1.60 cm LV IVS:        1.30 cm LVOT diam:     1.80 cm LV SV:         17 LV SV Index:   10 LVOT Area:     2.54 cm  RIGHT VENTRICLE            IVC RV S prime:     6.06 cm/s  IVC diam: 2.30 cm LEFT ATRIUM             Index       RIGHT ATRIUM           Index LA diam:        4.10 cm 2.58 cm/m  RA Area:     13.00 cm LA Vol (A2C):   66.7 ml 41.90 ml/m RA Volume:   28.80 ml  18.09 ml/m LA Vol (A4C):   57.5 ml 36.12 ml/m LA Biplane Vol: 65.8 ml 41.34 ml/m  AORTIC VALVE LVOT Vmax:   41.80 cm/s LVOT Vmean:  24.300 cm/s LVOT VTI:    0.066 m  AORTA Ao Root diam: 2.60 cm Ao Asc diam:  2.90 cm TRICUSPID VALVE TR Peak grad:   30.9 mmHg TR Vmax:        278.00 cm/s  SHUNTS Systemic VTI:  0.07 m Systemic Diam: 1.80 cm Rachelle Hora Croitoru MD Electronically signed by Thurmon Fair MD Signature Date/Time: 03/19/2020/12:24:51 PM    Final    [place] EKG SITE RITE  Result Date: 04/15/2020 If Site Rite image not attached, placement could not be confirmed due to current cardiac rhythm.  [place] EKG SITE RITE  Result Date: 03/18/2020 If Site Rite image not attached, placement could not be confirmed due to current cardiac rhythm.   Duration of Death Summary Encounter   Greater than 30 minutes including physician time.  Signed, Donato Schultz,  MD 04/30/2020, 3:01 PM

## 2020-05-18 NOTE — Progress Notes (Signed)
Daily Progress Note   Patient Name: Jamie Mueller       Date: 2020-05-17 DOB: 1930/10/07  Age: 87 y.o. MRN#: [id number] Attending Physician: Lorretta Harp, MD Primary Care Physician: Seward Carol, MD Admit Date: 04/17/2020  Reason for Consultation/Follow-up: To discuss complex medical decision making related to patient's goals of care  Patient's BP is remaining in the 80s despite nor epi.  HR is in the 130 - 140s.  Patient's right leg is painful to touch - feet are cold and beginning to mottle  Subjective: Met with patient's 3 children at bedside and in the conference room.  This is a very loving close knit family who has struggled greatly with making the best possible decisions for their mother.  Jamie Mueller has been anuric for days.  Her hands are swelling and her lungs have crackles.  I expressed my fear to the family that we are reaching the point where the fluid build up is likely to start causing respiratory distress.  The 3 children do not want her to suffer.  They also do not want to lose her one minute before they have to.  Her God son is also her Doristine Bosworth.  The 3 children invited him to pray with Jamie Mueller before turning off the nor epi and milrinone.  Rise Paganini sang a Financial trader and Tatiyanna Lashley prayed.  Jamie Mueller changes her facial expression in an attempt to smile as she hears [place] sing.  Afterward we discussed a move to 6N where Rise Paganini may be more comfortable spending the night with her mother.   Assessment: Patient is beginning the process of actively dying - feet cold and mottled, fluid accumulating in her body.  She appears generally comfortable with the help of a dilaudid gtt.   Patient Profile/HPI:  87 y.o. female  with past medical history of  hypertension, diabetes, hyperlipidemia, A. Fib (not on [facility]), and dementia admitted on 03/28/2020 withA. fib with RVR, inferolateral STEMI, and AKI. Cardiology elected for medical management.Hospitalization complicated by progressive anuric AKI and cardiogenic shock. Not a candidate for dialysis. She has been supported on milrinone and norepinephrine. She has had ongoing confusion to the point of pulling out her PICC on 3/28. 3/29 hypotension, mental status, and HR worsened. PMT consulted to discuss [place].  Length of Stay: 8  Vital Signs: BP (!) 83/52   Pulse (!) 139   Temp 98.6 F (37 C)   Resp 15   Ht 5' 2"  (1.575 m)   Wt 61.1 kg   SpO2 90%   BMI 24.64 kg/m  SpO2: SpO2: 90 % O2 Device: O2 Device: Nasal Cannula O2 Flow Rate: O2 Flow Rate (L/min): 4 L/min       Palliative Assessment/Data: 10%     Palliative Care Plan    Recommendations/Plan:  Comfort Care.  2H RN team has been very attentive to Jamie Mueller.  Will place a range on the dilaudid gtt and increase the frequency of PRN boluses in case she needs them.  Will add back very low dose ativan in case she needs it.   Code Status:  DNR  Prognosis:   Hours - Days   Discharge Planning:  Anticipated Hospital Death  Care plan was discussed with family, RN  Thank you for allowing the Palliative Medicine Team to assist in the care of this patient.  Total time spent:  60 min     Greater than 50%  of this time was spent counseling and coordinating care related to the above assessment and plan.  Florentina Jenny, PA-C Palliative Medicine  Please contact Palliative MedicineTeam phone at [phone] for questions and concerns between 7 am - 7 pm.   Please see AMION for individual provider pager numbers.

## 2020-05-18 NOTE — Progress Notes (Signed)
Dilaudid iv wasted in stericylce witnessed by Percell Miller RN.   Dilaudid 10 ml wasted in Stericycle witnessed by [employer].

## 2020-05-18 NOTE — Progress Notes (Signed)
Spoke with patients daughter Meriam Sprague about her feeling as if her mom was transitioning and wanted family to be able to be with her during this time.  I explained our visitation guidelines and expressed understanding of her feelings.  I offered to call our Director on call Maryella Shivers.  We came to the agreement of 3 visitors at a time from the group already here at hospital for today and Sunday.  We will reevaluate on Monday.  The patients nurse Victorino Dike M) and I went into the room to inform the family of our visitation exception.  Meriam Sprague expressed her understanding and was grateful to [place] for accommodating her family.  At this time patient remains a DNR not yet comfort care.  Meeting set up with Palliative Care for later on this afternoon.

## 2020-05-18 NOTE — Progress Notes (Signed)
Aystole noted on bedside ekg.  No heart tones auscultated x1 min by myself and Aline August RN.  Pt with family at bedside.

## 2020-05-18 DEATH — deceased

## 2022-08-30 IMAGING — CT CT ABD-PELV W/O CM
2 of 4 series · 17 of 46 positions shown, 19 images · non-contrast
Comparison: None.

CLINICAL DATA: Right upper quadrant pain that radiates to back.

EXAM:
CT ABDOMEN AND PELVIS WITHOUT CONTRAST
TECHNIQUE: Multidetector CT imaging of the abdomen and pelvis was performed
following the standard protocol without IV contrast.

[Series 2: axial st · axial · 0.83mm/px · z∈[-534,-184]mm · 14 of 80 slices shown, 16 images]
[im 5/80  soft-tissue]
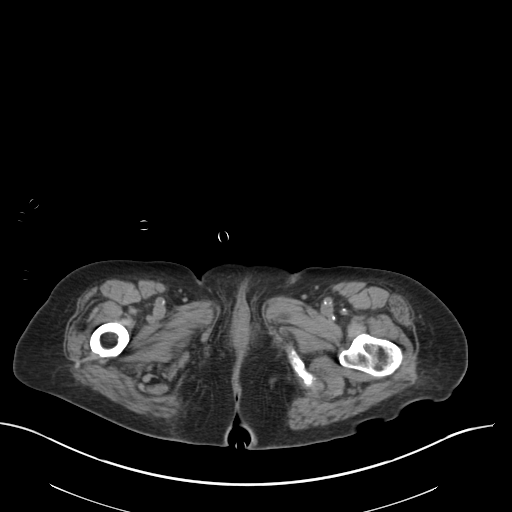
[im 5/80  bone]
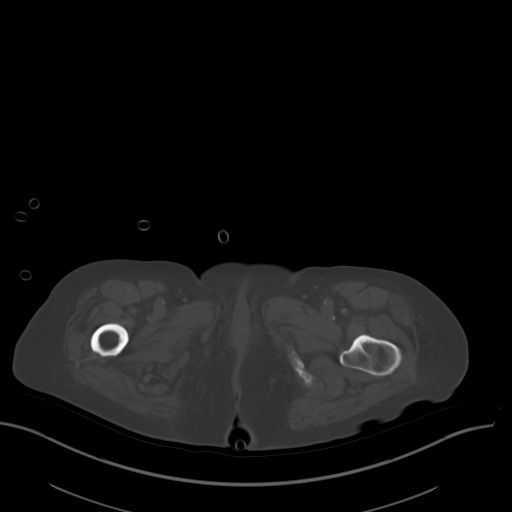
[im 10/80  soft-tissue]
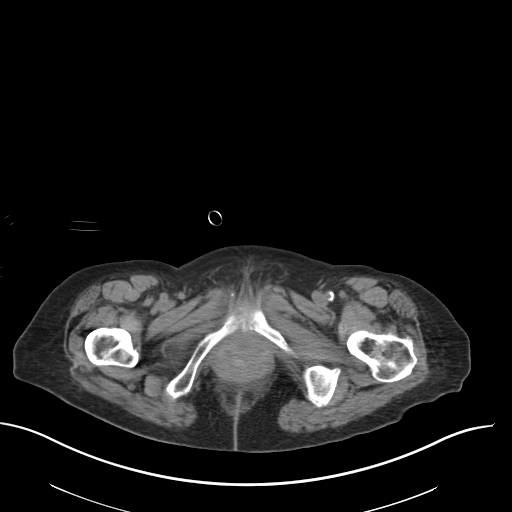
[im 14/80  soft-tissue]
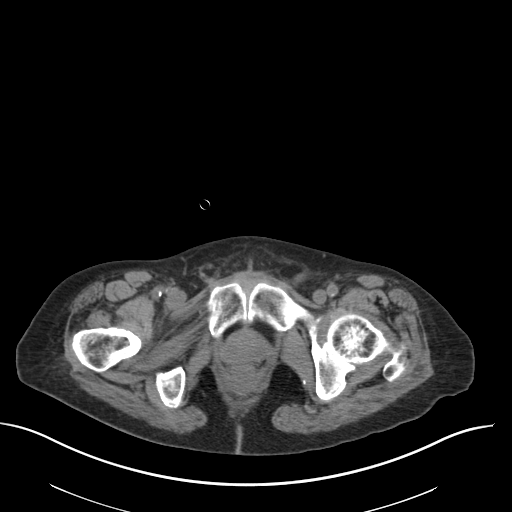
[im 24/80  soft-tissue]
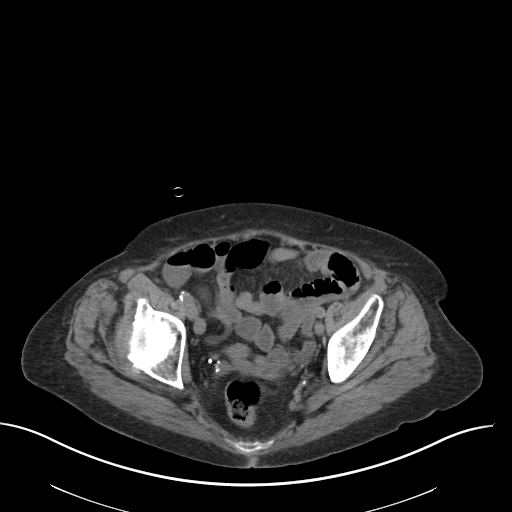
[im 28/80  soft-tissue]
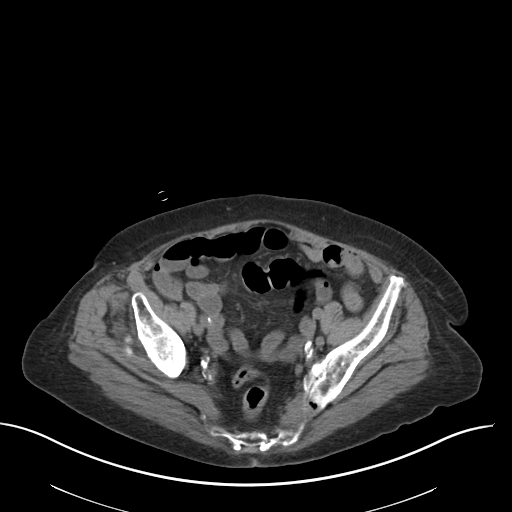
[im 33/80  soft-tissue]
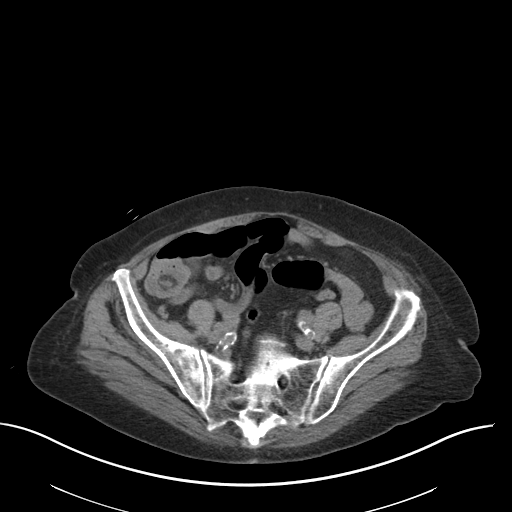
[im 38/80  soft-tissue]
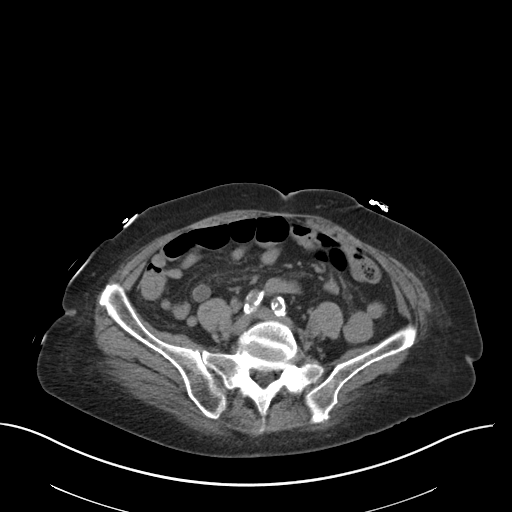
[im 42/80  soft-tissue]
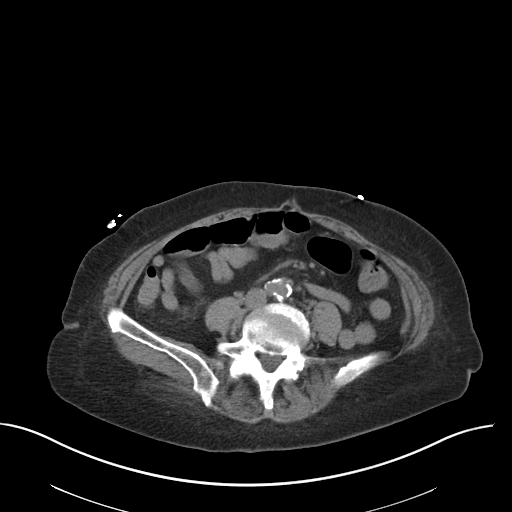
[im 47/80  soft-tissue]
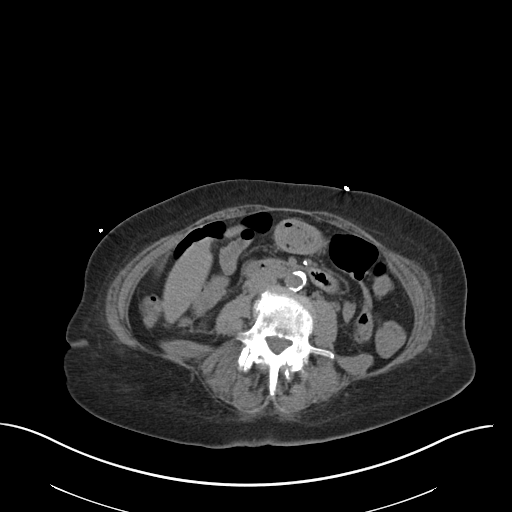
[im 47/80  bone]
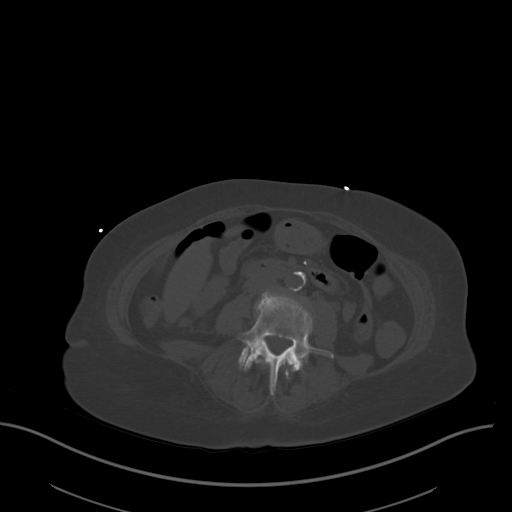
[im 52/80  soft-tissue]
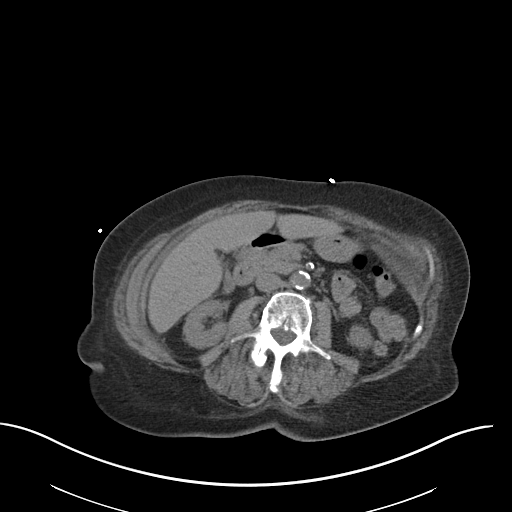
[im 61/80  soft-tissue]
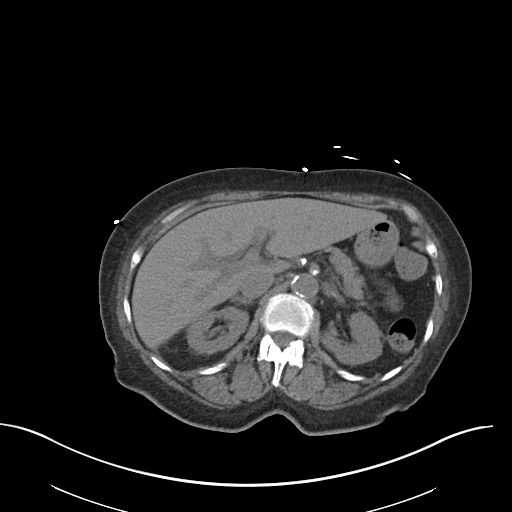
[im 66/80  soft-tissue]
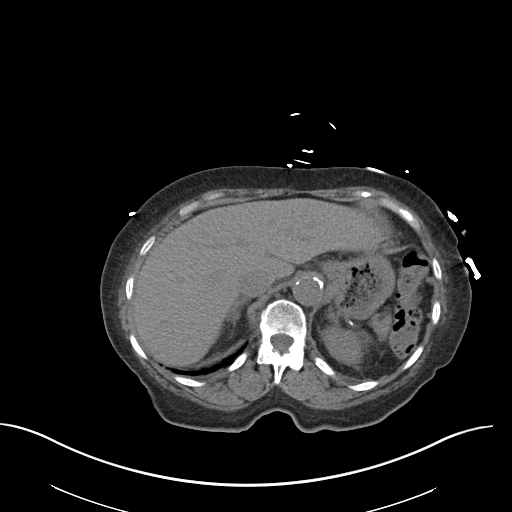
[im 70/80  soft-tissue]
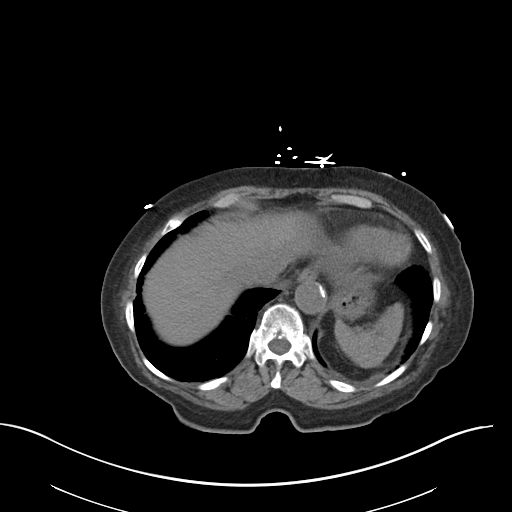
[im 75/80  soft-tissue]
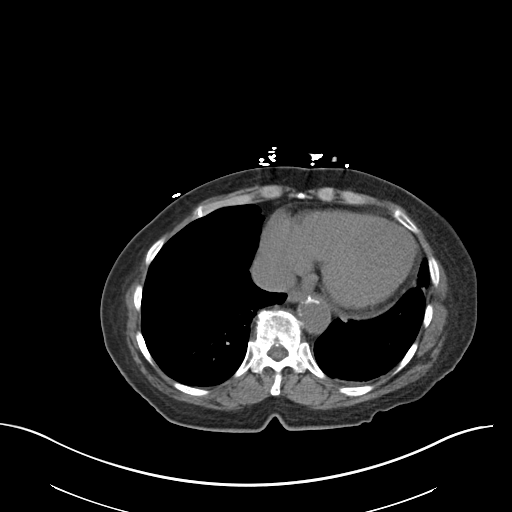

[Series 4: coronal st · coronal · 0.74mm/px · 3 of 126 slices shown]
[im 42/126  soft-tissue]
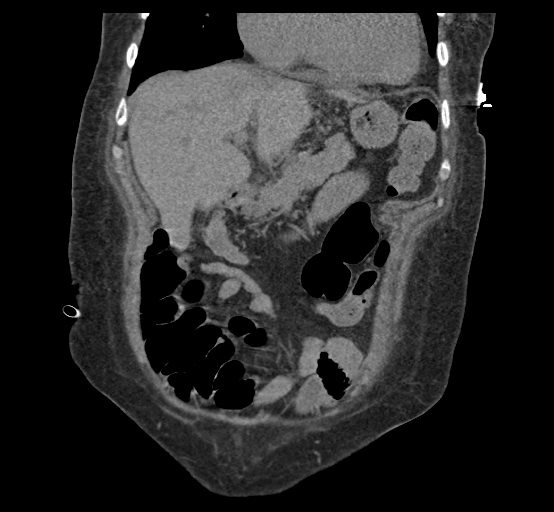
[im 56/126  soft-tissue]
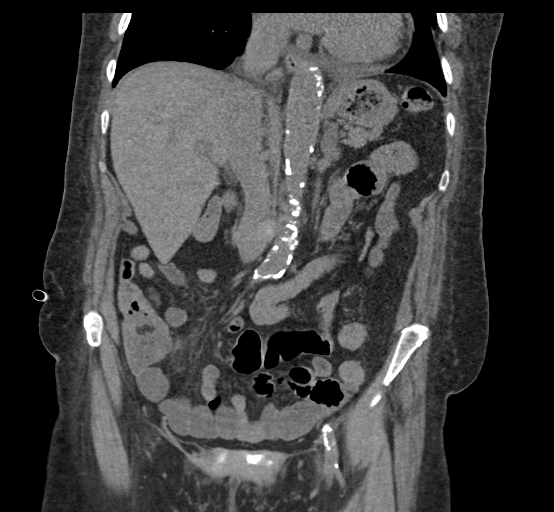
[im 70/126  soft-tissue]
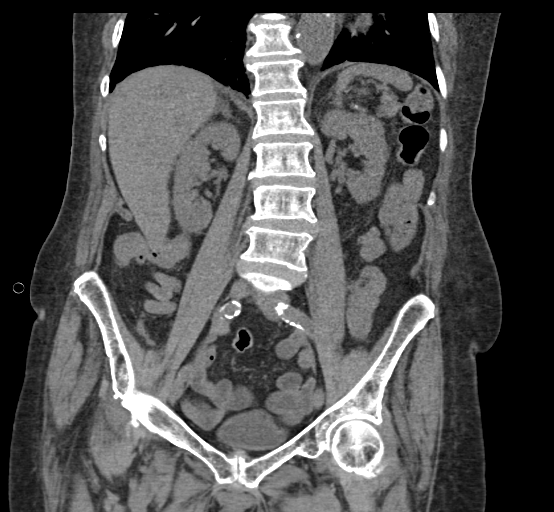

[17 of 46 positions shown; findings below may reference images not displayed]

FINDINGS: Lower chest: Bibasilar atelectasis.

Hepatobiliary: Unremarkable noncontrast appearance of the hepatic
parenchyma. Gallbladder surgically absent. No biliary ductal
dilation.

Pancreas: Unremarkable.

Spleen: Unremarkable.

Adrenals/Urinary Tract: Bilateral adrenal glands are unremarkable.
No hydronephrosis. No nephrolithiasis. Urinary bladder is grossly
unremarkable for degree of distension.

Stomach/Bowel: Stomach is grossly unremarkable for degree of
distension. No small bowel dilation. Appendix is visualized in the
right lower quadrant and appears unremarkable. Scattered colonic
diverticula without findings acute diverticulitis.

Vascular/Lymphatic: Aortic atherosclerosis. No enlarged abdominal or
pelvic lymph nodes.

Reproductive: Status post hysterectomy. No adnexal masses.

Other: No abdominopelvic ascites.

Musculoskeletal: Mild levoconvex curvature of the spine multilevel
degenerative changes spine. Grade 1 degenerative anterolisthesis of
L4 on L5. Degenerative change of the hips, SI joints and pubic
symphysis.
IMPRESSION: 1. No acute findings in the abdomen or pelvis.
2. Colonic diverticulosis without findings of acute diverticulitis.
3. Aortic atherosclerosis.

Aortic Atherosclerosis ([id number]).

## 2022-09-02 IMAGING — DX DG CHEST 1V PORT
1 series · 1 of 1 positions shown · non-contrast
Comparison: April 12, 2020

CLINICAL DATA: Shortness of breath

EXAM:
PORTABLE CHEST 1 VIEW

[chest ap]
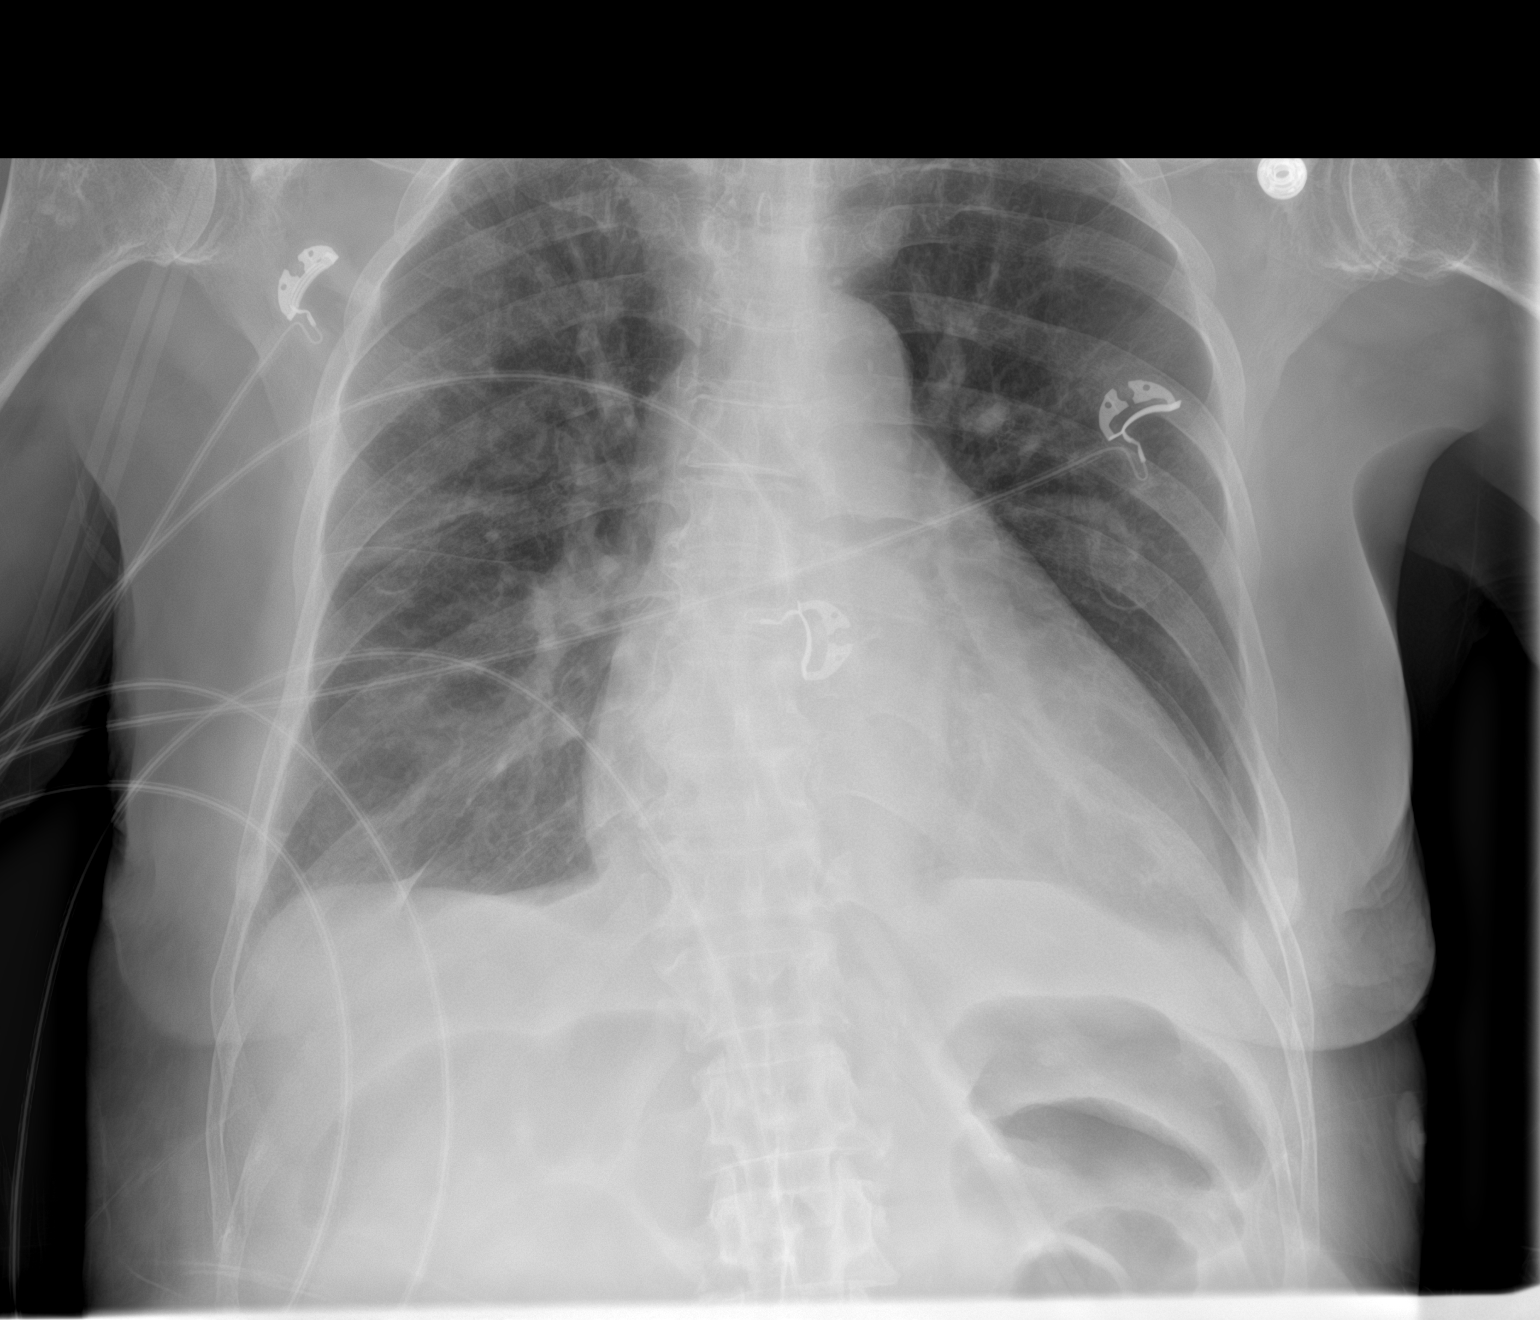

[1 of 1 positions shown; findings below may reference images not displayed]

FINDINGS: There is no edema or airspace opacity. There has been interval
clearing of atelectatic change from the left base. There is
cardiomegaly with mild pulmonary venous hypertension. No adenopathy.
Postoperative change noted in the upper thoracic region in the
midline.
IMPRESSION: No edema or airspace opacity. Interval clearing of left base
atelectasis. There is cardiomegaly with a degree of pulmonary
vascular congestion.

## 2022-09-04 IMAGING — DX DG CHEST 1V PORT
1 series · 1 of 1 positions shown · non-contrast
Comparison: 6412 hours today and earlier.

CLINICAL DATA: 89-year-old female status post PICC line placement.

EXAM:
PORTABLE CHEST 1 VIEW

[chest ap]
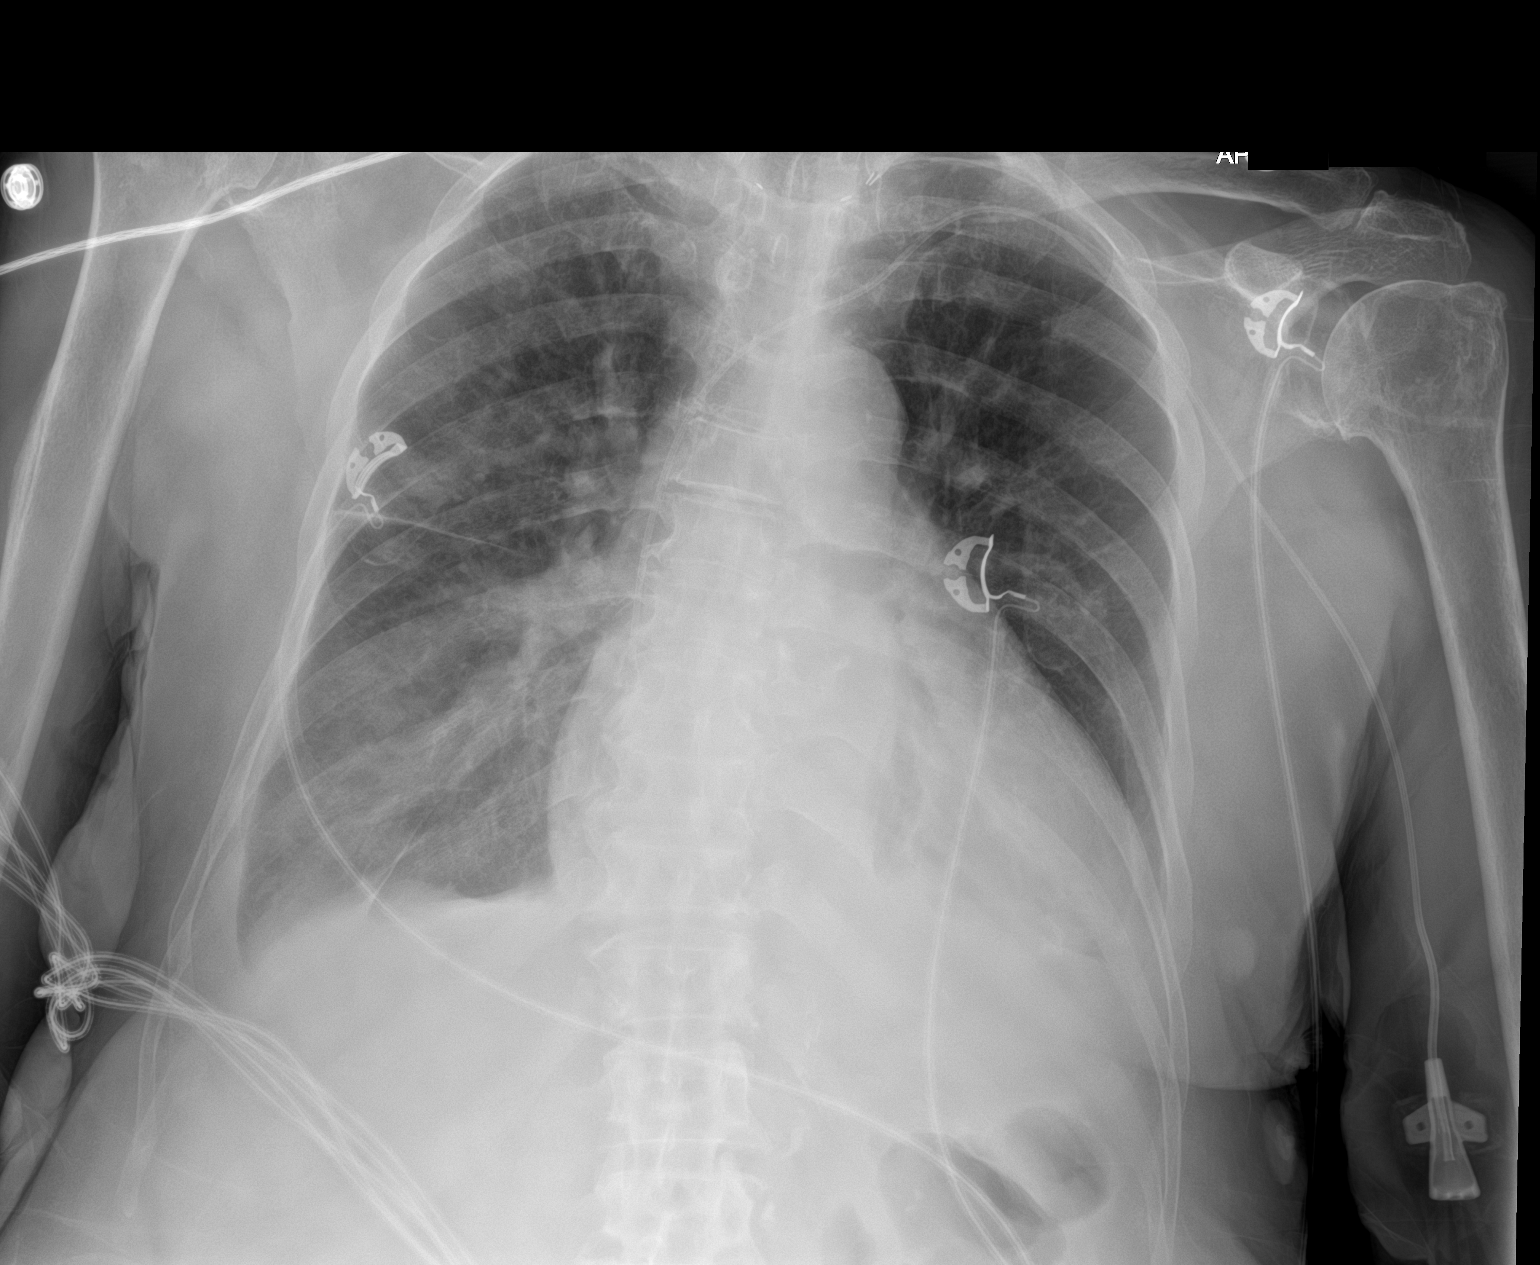

[1 of 1 positions shown; findings below may reference images not displayed]

FINDINGS: Portable AP semi upright view at 6151 hours. Left upper extremity
PICC line placed, tip at the cavoatrial junction level. Continued
mild rotation to the left. Mildly larger lung volumes and decreased
pulmonary vascularity. Bibasilar ventilation has also improved with
decreased bilateral veiling opacity. No pneumothorax. Stable
surgical clips at the thoracic inlet. No acute osseous abnormality
identified. Negative visible bowel gas.
IMPRESSION: 1. Left upper extremity PICC line placed, tip at the cavoatrial
junction level.
2. Improved lung volumes and ventilation since 6412 hours.
